# Patient Record
Sex: Male | Born: 1945
Health system: Southern US, Community
[De-identification: ages and names within clinical notes are randomized; demographics above are authoritative.]

## PROBLEM LIST (undated history)

## (undated) DIAGNOSIS — K8050 Calculus of bile duct without cholangitis or cholecystitis without obstruction: Secondary | ICD-10-CM

## (undated) DIAGNOSIS — E785 Hyperlipidemia, unspecified: Secondary | ICD-10-CM

## (undated) DIAGNOSIS — I10 Essential (primary) hypertension: Secondary | ICD-10-CM

## (undated) DIAGNOSIS — J189 Pneumonia, unspecified organism: Principal | ICD-10-CM

## (undated) DIAGNOSIS — N419 Inflammatory disease of prostate, unspecified: Secondary | ICD-10-CM

## (undated) DIAGNOSIS — M109 Gout, unspecified: Secondary | ICD-10-CM

## (undated) DIAGNOSIS — I2510 Atherosclerotic heart disease of native coronary artery without angina pectoris: Secondary | ICD-10-CM

## (undated) DIAGNOSIS — K219 Gastro-esophageal reflux disease without esophagitis: Secondary | ICD-10-CM

## (undated) DIAGNOSIS — I219 Acute myocardial infarction, unspecified: Secondary | ICD-10-CM

## (undated) HISTORY — DX: Gout, unspecified: M10.9

## (undated) HISTORY — DX: Calculus of bile duct without cholangitis or cholecystitis without obstruction: K80.50

## (undated) HISTORY — DX: Pneumonia, unspecified organism: J18.9

## (undated) HISTORY — DX: Inflammatory disease of prostate, unspecified: N41.9

## (undated) HISTORY — PX: NASAL SEPTUM SURGERY: SHX37

## (undated) HISTORY — DX: Atherosclerotic heart disease of native coronary artery without angina pectoris: I25.10

## (undated) HISTORY — DX: Gastro-esophageal reflux disease without esophagitis: K21.9

## (undated) HISTORY — DX: Essential (primary) hypertension: I10

## (undated) HISTORY — DX: Acute myocardial infarction, unspecified: I21.9

## (undated) HISTORY — DX: Hyperlipidemia, unspecified: E78.5

---

## 2000-01-05 ENCOUNTER — Encounter: Payer: Self-pay | Admitting: Family Medicine

## 2000-01-05 ENCOUNTER — Ambulatory Visit (HOSPITAL_COMMUNITY): Admission: RE | Admit: 2000-01-05 | Discharge: 2000-01-05 | Payer: Self-pay | Admitting: Family Medicine

## 2002-03-13 ENCOUNTER — Encounter: Payer: Self-pay | Admitting: Gastroenterology

## 2002-03-13 ENCOUNTER — Encounter: Payer: Self-pay | Admitting: Internal Medicine

## 2002-03-13 LAB — HM COLONOSCOPY

## 2002-10-29 ENCOUNTER — Inpatient Hospital Stay (HOSPITAL_COMMUNITY): Admission: AD | Admit: 2002-10-29 | Discharge: 2002-11-04 | Payer: Self-pay | Admitting: Internal Medicine

## 2002-10-30 ENCOUNTER — Encounter (INDEPENDENT_AMBULATORY_CARE_PROVIDER_SITE_OTHER): Payer: Self-pay | Admitting: *Deleted

## 2002-10-30 ENCOUNTER — Encounter: Payer: Self-pay | Admitting: Internal Medicine

## 2002-10-31 ENCOUNTER — Encounter: Payer: Self-pay | Admitting: Surgery

## 2002-10-31 ENCOUNTER — Encounter (INDEPENDENT_AMBULATORY_CARE_PROVIDER_SITE_OTHER): Payer: Self-pay | Admitting: *Deleted

## 2002-10-31 ENCOUNTER — Encounter (INDEPENDENT_AMBULATORY_CARE_PROVIDER_SITE_OTHER): Payer: Self-pay | Admitting: Specialist

## 2002-11-03 ENCOUNTER — Encounter (INDEPENDENT_AMBULATORY_CARE_PROVIDER_SITE_OTHER): Payer: Self-pay | Admitting: *Deleted

## 2002-11-03 ENCOUNTER — Encounter: Payer: Self-pay | Admitting: Internal Medicine

## 2002-12-01 ENCOUNTER — Encounter: Payer: Self-pay | Admitting: Internal Medicine

## 2002-12-04 ENCOUNTER — Encounter: Payer: Self-pay | Admitting: Internal Medicine

## 2002-12-04 ENCOUNTER — Ambulatory Visit (HOSPITAL_COMMUNITY): Admission: RE | Admit: 2002-12-04 | Discharge: 2002-12-04 | Payer: Self-pay | Admitting: Internal Medicine

## 2002-12-04 ENCOUNTER — Encounter (INDEPENDENT_AMBULATORY_CARE_PROVIDER_SITE_OTHER): Payer: Self-pay | Admitting: *Deleted

## 2004-05-10 ENCOUNTER — Ambulatory Visit: Payer: Self-pay | Admitting: Internal Medicine

## 2004-05-16 ENCOUNTER — Ambulatory Visit: Payer: Self-pay | Admitting: Internal Medicine

## 2004-05-28 HISTORY — PX: CHOLECYSTECTOMY: SHX55

## 2004-07-24 ENCOUNTER — Ambulatory Visit: Payer: Self-pay | Admitting: Internal Medicine

## 2004-08-01 ENCOUNTER — Ambulatory Visit: Payer: Self-pay | Admitting: Internal Medicine

## 2004-08-28 ENCOUNTER — Ambulatory Visit: Payer: Self-pay | Admitting: Internal Medicine

## 2004-09-21 ENCOUNTER — Encounter (INDEPENDENT_AMBULATORY_CARE_PROVIDER_SITE_OTHER): Payer: Self-pay | Admitting: *Deleted

## 2004-09-21 ENCOUNTER — Ambulatory Visit (HOSPITAL_COMMUNITY): Admission: RE | Admit: 2004-09-21 | Discharge: 2004-09-21 | Payer: Self-pay | Admitting: Internal Medicine

## 2004-09-21 ENCOUNTER — Ambulatory Visit: Payer: Self-pay | Admitting: Internal Medicine

## 2004-10-04 ENCOUNTER — Ambulatory Visit: Payer: Self-pay | Admitting: Internal Medicine

## 2004-10-28 ENCOUNTER — Emergency Department (HOSPITAL_COMMUNITY): Admission: EM | Admit: 2004-10-28 | Discharge: 2004-10-29 | Payer: Self-pay | Admitting: Emergency Medicine

## 2004-10-29 ENCOUNTER — Encounter: Payer: Self-pay | Admitting: Internal Medicine

## 2004-10-30 ENCOUNTER — Ambulatory Visit: Payer: Self-pay | Admitting: Internal Medicine

## 2004-11-01 ENCOUNTER — Ambulatory Visit (HOSPITAL_COMMUNITY): Admission: RE | Admit: 2004-11-01 | Discharge: 2004-11-01 | Payer: Self-pay | Admitting: Internal Medicine

## 2004-11-01 ENCOUNTER — Ambulatory Visit: Payer: Self-pay | Admitting: Internal Medicine

## 2004-11-14 ENCOUNTER — Ambulatory Visit: Payer: Self-pay | Admitting: Internal Medicine

## 2005-02-01 ENCOUNTER — Ambulatory Visit: Payer: Self-pay | Admitting: Internal Medicine

## 2005-09-18 ENCOUNTER — Ambulatory Visit: Payer: Self-pay | Admitting: Internal Medicine

## 2005-09-24 ENCOUNTER — Ambulatory Visit: Payer: Self-pay | Admitting: Internal Medicine

## 2005-09-25 ENCOUNTER — Ambulatory Visit: Payer: Self-pay | Admitting: Internal Medicine

## 2005-10-16 ENCOUNTER — Ambulatory Visit: Payer: Self-pay | Admitting: Internal Medicine

## 2005-10-19 ENCOUNTER — Ambulatory Visit: Payer: Self-pay | Admitting: Internal Medicine

## 2006-05-28 DIAGNOSIS — I2510 Atherosclerotic heart disease of native coronary artery without angina pectoris: Secondary | ICD-10-CM

## 2006-05-28 HISTORY — PX: CORONARY ANGIOPLASTY WITH STENT PLACEMENT: SHX49

## 2006-05-28 HISTORY — DX: Atherosclerotic heart disease of native coronary artery without angina pectoris: I25.10

## 2006-09-30 ENCOUNTER — Ambulatory Visit: Payer: Self-pay | Admitting: Internal Medicine

## 2006-09-30 LAB — CONVERTED CEMR LAB
ALT: 21 units/L (ref 0–40)
AST: 19 units/L (ref 0–37)
Albumin: 4 g/dL (ref 3.5–5.2)
Alkaline Phosphatase: 87 units/L (ref 39–117)
BUN: 24 mg/dL — ABNORMAL HIGH (ref 6–23)
Basophils Absolute: 0 10*3/uL (ref 0.0–0.1)
Basophils Relative: 0 % (ref 0.0–1.0)
Bilirubin, Direct: 0.1 mg/dL (ref 0.0–0.3)
CO2: 30 meq/L (ref 19–32)
Calcium: 9.4 mg/dL (ref 8.4–10.5)
Chloride: 108 meq/L (ref 96–112)
Cholesterol: 159 mg/dL (ref 0–200)
Creatinine, Ser: 1.4 mg/dL (ref 0.4–1.5)
Direct LDL: 75.1 mg/dL
Eosinophils Absolute: 0.2 10*3/uL (ref 0.0–0.6)
Eosinophils Relative: 4.4 % (ref 0.0–5.0)
GFR calc Af Amer: 66 mL/min
GFR calc non Af Amer: 55 mL/min
Glucose, Bld: 102 mg/dL — ABNORMAL HIGH (ref 70–99)
HCT: 41.3 % (ref 39.0–52.0)
HDL: 29.5 mg/dL — ABNORMAL LOW (ref >39.0)
Hemoglobin: 14.5 g/dL (ref 13.0–17.0)
Lymphocytes Relative: 26.5 % (ref 12.0–46.0)
MCHC: 35 g/dL (ref 30.0–36.0)
MCV: 91.7 fL (ref 78.0–100.0)
Monocytes Absolute: 0.5 10*3/uL (ref 0.2–0.7)
Monocytes Relative: 10.4 % (ref 3.0–11.0)
Neutro Abs: 2.8 10*3/uL (ref 1.4–7.7)
Neutrophils Relative %: 58.7 % (ref 43.0–77.0)
PSA: 0.57 ng/mL (ref 0.10–4.00)
Platelets: 197 10*3/uL (ref 150–400)
Potassium: 4.4 meq/L (ref 3.5–5.1)
RBC: 4.5 M/uL (ref 4.22–5.81)
RDW: 11.6 % (ref 11.5–14.6)
Sodium: 144 meq/L (ref 135–145)
TSH: 1.75 microintl units/mL (ref 0.35–5.50)
Total Bilirubin: 0.8 mg/dL (ref 0.3–1.2)
Total CHOL/HDL Ratio: 5.4
Total Protein: 7.1 g/dL (ref 6.0–8.3)
Triglycerides: 325 mg/dL (ref 0–149)
VLDL: 65 mg/dL — ABNORMAL HIGH (ref 0–40)
WBC: 4.8 10*3/uL (ref 4.5–10.5)

## 2006-11-11 ENCOUNTER — Ambulatory Visit: Payer: Self-pay | Admitting: Internal Medicine

## 2006-11-12 DIAGNOSIS — K831 Obstruction of bile duct: Secondary | ICD-10-CM

## 2006-11-12 DIAGNOSIS — E785 Hyperlipidemia, unspecified: Secondary | ICD-10-CM | POA: Insufficient documentation

## 2006-11-12 DIAGNOSIS — I10 Essential (primary) hypertension: Secondary | ICD-10-CM | POA: Insufficient documentation

## 2006-12-27 DIAGNOSIS — I219 Acute myocardial infarction, unspecified: Secondary | ICD-10-CM

## 2006-12-27 HISTORY — DX: Acute myocardial infarction, unspecified: I21.9

## 2007-01-07 ENCOUNTER — Inpatient Hospital Stay (HOSPITAL_COMMUNITY): Admission: EM | Admit: 2007-01-07 | Discharge: 2007-01-10 | Payer: Self-pay | Admitting: Emergency Medicine

## 2007-01-07 ENCOUNTER — Ambulatory Visit: Payer: Self-pay | Admitting: Cardiovascular Disease

## 2007-01-15 ENCOUNTER — Ambulatory Visit: Payer: Self-pay | Admitting: Cardiology

## 2007-01-20 ENCOUNTER — Ambulatory Visit: Payer: Self-pay | Admitting: Cardiology

## 2007-01-23 ENCOUNTER — Ambulatory Visit: Payer: Self-pay | Admitting: Cardiology

## 2007-01-23 ENCOUNTER — Inpatient Hospital Stay (HOSPITAL_COMMUNITY): Admission: RE | Admit: 2007-01-23 | Discharge: 2007-01-24 | Payer: Self-pay | Admitting: Cardiology

## 2007-01-30 ENCOUNTER — Ambulatory Visit: Payer: Self-pay | Admitting: Cardiology

## 2007-02-06 ENCOUNTER — Ambulatory Visit: Payer: Self-pay

## 2007-05-06 ENCOUNTER — Ambulatory Visit: Payer: Self-pay | Admitting: Cardiology

## 2007-08-08 ENCOUNTER — Telehealth: Payer: Self-pay | Admitting: Internal Medicine

## 2007-08-29 ENCOUNTER — Ambulatory Visit: Payer: Self-pay | Admitting: Cardiology

## 2007-09-20 DIAGNOSIS — K8050 Calculus of bile duct without cholangitis or cholecystitis without obstruction: Secondary | ICD-10-CM | POA: Insufficient documentation

## 2007-09-20 HISTORY — DX: Calculus of bile duct without cholangitis or cholecystitis without obstruction: K80.50

## 2007-10-06 ENCOUNTER — Ambulatory Visit: Payer: Self-pay | Admitting: Internal Medicine

## 2007-10-06 ENCOUNTER — Telehealth: Payer: Self-pay | Admitting: Internal Medicine

## 2007-10-08 LAB — CONVERTED CEMR LAB
AST: 28 units/L (ref 0–37)
Albumin: 3.5 g/dL (ref 3.5–5.2)
Alkaline Phosphatase: 85 units/L (ref 39–117)
Basophils Relative: 0 % (ref 0.0–1.0)
Bilirubin, Direct: 0.1 mg/dL (ref 0.0–0.3)
Eosinophils Absolute: 0.1 10*3/uL (ref 0.0–0.7)
Eosinophils Relative: 1.7 % (ref 0.0–5.0)
GFR calc non Af Amer: 59 mL/min
HCT: 39.7 % (ref 39.0–52.0)
Lymphocytes Relative: 22.9 % (ref 12.0–46.0)
MCHC: 35 g/dL (ref 30.0–36.0)
Neutro Abs: 2.4 10*3/uL (ref 1.4–7.7)
Potassium: 4 meq/L (ref 3.5–5.1)
RBC: 4.26 M/uL (ref 4.22–5.81)
Total Protein: 6.1 g/dL (ref 6.0–8.3)

## 2007-10-09 ENCOUNTER — Ambulatory Visit: Payer: Self-pay | Admitting: Cardiology

## 2007-10-16 ENCOUNTER — Encounter: Payer: Self-pay | Admitting: Internal Medicine

## 2007-10-16 LAB — CONVERTED CEMR LAB
Albumin: 4.2 g/dL (ref 3.5–5.2)
Alkaline Phosphatase: 99 units/L (ref 39–117)
Bilirubin, Direct: 0.2 mg/dL (ref 0.0–0.3)
Total Bilirubin: 1.4 mg/dL — ABNORMAL HIGH (ref 0.3–1.2)

## 2007-10-29 ENCOUNTER — Ambulatory Visit: Payer: Self-pay | Admitting: Internal Medicine

## 2007-10-29 DIAGNOSIS — I2510 Atherosclerotic heart disease of native coronary artery without angina pectoris: Secondary | ICD-10-CM | POA: Insufficient documentation

## 2007-10-29 DIAGNOSIS — I25119 Atherosclerotic heart disease of native coronary artery with unspecified angina pectoris: Secondary | ICD-10-CM | POA: Insufficient documentation

## 2007-11-03 ENCOUNTER — Ambulatory Visit: Payer: Self-pay | Admitting: Internal Medicine

## 2007-11-03 LAB — CONVERTED CEMR LAB
BUN: 19 mg/dL (ref 6–23)
Basophils Absolute: 0 10*3/uL (ref 0.0–0.1)
CO2: 28 meq/L (ref 19–32)
Calcium: 9 mg/dL (ref 8.4–10.5)
Eosinophils Absolute: 0.2 10*3/uL (ref 0.0–0.7)
Eosinophils Relative: 3.8 % (ref 0.0–5.0)
GFR calc Af Amer: 72 mL/min
GFR calc non Af Amer: 59 mL/min
Glucose, Bld: 90 mg/dL (ref 70–99)
HCT: 42.4 % (ref 39.0–52.0)
LDL Cholesterol: 44 mg/dL (ref 0–99)
Lymphocytes Relative: 26.6 % (ref 12.0–46.0)
MCHC: 34.4 g/dL (ref 30.0–36.0)
Monocytes Relative: 12 % (ref 3.0–12.0)
Neutro Abs: 2.6 10*3/uL (ref 1.4–7.7)
Nitrite: NEGATIVE
PSA: 0.5 ng/mL (ref 0.10–4.00)
Potassium: 3.9 meq/L (ref 3.5–5.1)
RBC: 4.46 M/uL (ref 4.22–5.81)
Sodium: 141 meq/L (ref 135–145)
Specific Gravity, Urine: 1.025
Total CHOL/HDL Ratio: 3.5
Triglycerides: 154 mg/dL — ABNORMAL HIGH (ref 0–149)
Urobilinogen, UA: 0.2
WBC: 4.6 10*3/uL (ref 4.5–10.5)

## 2007-11-26 ENCOUNTER — Ambulatory Visit: Payer: Self-pay | Admitting: Internal Medicine

## 2007-12-24 ENCOUNTER — Ambulatory Visit: Payer: Self-pay | Admitting: Cardiology

## 2007-12-24 ENCOUNTER — Encounter: Payer: Self-pay | Admitting: Internal Medicine

## 2008-03-15 LAB — CONVERTED CEMR LAB
Cholesterol: 93 mg/dL
HDL: 35 mg/dL
Triglycerides: 150 mg/dL

## 2008-03-31 ENCOUNTER — Ambulatory Visit: Payer: Self-pay | Admitting: Cardiology

## 2008-04-06 ENCOUNTER — Ambulatory Visit: Payer: Self-pay | Admitting: Internal Medicine

## 2008-06-16 ENCOUNTER — Ambulatory Visit: Payer: Self-pay | Admitting: Internal Medicine

## 2008-06-30 ENCOUNTER — Ambulatory Visit: Payer: Self-pay | Admitting: Cardiology

## 2008-07-12 ENCOUNTER — Ambulatory Visit: Payer: Self-pay | Admitting: Internal Medicine

## 2008-07-16 ENCOUNTER — Ambulatory Visit: Payer: Self-pay | Admitting: Internal Medicine

## 2008-09-16 ENCOUNTER — Ambulatory Visit: Payer: Self-pay | Admitting: Internal Medicine

## 2008-09-16 LAB — CONVERTED CEMR LAB
BUN: 22 mg/dL (ref 6–23)
Bilirubin, Direct: 0.2 mg/dL (ref 0.0–0.3)
Calcium: 9.3 mg/dL (ref 8.4–10.5)
Cholesterol: 100 mg/dL (ref 0–200)
Creatinine, Ser: 1.2 mg/dL (ref 0.4–1.5)
GFR calc non Af Amer: 64.93 mL/min (ref >60)
Glucose, Bld: 88 mg/dL (ref 70–99)
HDL: 26.1 mg/dL — ABNORMAL LOW (ref >39.00)
LDL Cholesterol: 36 mg/dL (ref 0–99)
Potassium: 4.2 meq/L (ref 3.5–5.1)
Sodium: 142 meq/L (ref 135–145)
Total Bilirubin: 2 mg/dL — ABNORMAL HIGH (ref 0.3–1.2)
Total CHOL/HDL Ratio: 4
Total Protein: 6.7 g/dL (ref 6.0–8.3)

## 2008-09-24 ENCOUNTER — Ambulatory Visit: Payer: Self-pay | Admitting: Internal Medicine

## 2008-10-11 ENCOUNTER — Ambulatory Visit: Payer: Self-pay | Admitting: Cardiology

## 2009-01-11 ENCOUNTER — Ambulatory Visit: Payer: Self-pay | Admitting: Cardiology

## 2009-01-11 ENCOUNTER — Encounter: Payer: Self-pay | Admitting: Internal Medicine

## 2009-01-26 LAB — CONVERTED CEMR LAB
ALT: 24 units/L (ref 0–53)
Alkaline Phosphatase: 115 units/L (ref 39–117)
Cholesterol: 107 mg/dL (ref 0–200)
Total Bilirubin: 1.2 mg/dL (ref 0.3–1.2)
Total CHOL/HDL Ratio: 4
VLDL: 50 mg/dL — ABNORMAL HIGH (ref 0.0–40.0)

## 2009-02-03 ENCOUNTER — Telehealth: Payer: Self-pay | Admitting: Cardiology

## 2009-02-10 ENCOUNTER — Encounter: Payer: Self-pay | Admitting: Cardiology

## 2009-02-25 ENCOUNTER — Telehealth (INDEPENDENT_AMBULATORY_CARE_PROVIDER_SITE_OTHER): Payer: Self-pay | Admitting: *Deleted

## 2009-02-28 ENCOUNTER — Encounter: Payer: Self-pay | Admitting: Internal Medicine

## 2009-02-28 ENCOUNTER — Encounter: Payer: Self-pay | Admitting: Cardiology

## 2009-03-02 ENCOUNTER — Telehealth: Payer: Self-pay | Admitting: Cardiology

## 2009-03-03 ENCOUNTER — Ambulatory Visit: Payer: Self-pay | Admitting: Internal Medicine

## 2009-03-10 ENCOUNTER — Ambulatory Visit: Payer: Self-pay | Admitting: Internal Medicine

## 2009-03-10 LAB — CONVERTED CEMR LAB
AST: 20 units/L (ref 0–37)
Albumin: 4.4 g/dL (ref 3.5–5.2)
Alkaline Phosphatase: 90 units/L (ref 39–117)
Cholesterol: 131 mg/dL (ref 0–200)
Creatinine, Ser: 1.5 mg/dL (ref 0.4–1.5)
Direct LDL: 60.1 mg/dL
Glucose, Bld: 95 mg/dL (ref 70–99)
HDL: 29.6 mg/dL — ABNORMAL LOW (ref >39.00)
Potassium: 4.5 meq/L (ref 3.5–5.1)
Sodium: 139 meq/L (ref 135–145)
Total CHOL/HDL Ratio: 4

## 2009-03-25 ENCOUNTER — Ambulatory Visit: Payer: Self-pay | Admitting: Cardiology

## 2009-03-31 ENCOUNTER — Ambulatory Visit: Payer: Self-pay | Admitting: Internal Medicine

## 2009-04-29 ENCOUNTER — Encounter (INDEPENDENT_AMBULATORY_CARE_PROVIDER_SITE_OTHER): Payer: Self-pay | Admitting: *Deleted

## 2009-06-29 ENCOUNTER — Encounter: Payer: Self-pay | Admitting: Cardiology

## 2009-06-30 ENCOUNTER — Ambulatory Visit: Payer: Self-pay | Admitting: Cardiology

## 2009-08-23 ENCOUNTER — Encounter (INDEPENDENT_AMBULATORY_CARE_PROVIDER_SITE_OTHER): Payer: Self-pay | Admitting: *Deleted

## 2009-10-05 ENCOUNTER — Ambulatory Visit: Payer: Self-pay | Admitting: Internal Medicine

## 2009-10-05 LAB — CONVERTED CEMR LAB
ALT: 18 units/L (ref 0–53)
AST: 21 units/L (ref 0–37)
Albumin: 4.2 g/dL (ref 3.5–5.2)
Alkaline Phosphatase: 93 units/L (ref 39–117)
Bilirubin, Direct: 0.1 mg/dL (ref 0.0–0.3)
Cholesterol: 109 mg/dL (ref 0–200)
Direct LDL: 49.2 mg/dL
HDL: 30.2 mg/dL — ABNORMAL LOW (ref >39.00)
Potassium: 4 meq/L (ref 3.5–5.1)
Total Bilirubin: 1 mg/dL (ref 0.3–1.2)
Total CHOL/HDL Ratio: 4
Triglycerides: 204 mg/dL — ABNORMAL HIGH (ref 0.0–149.0)

## 2009-10-14 ENCOUNTER — Ambulatory Visit: Payer: Self-pay | Admitting: Internal Medicine

## 2009-11-15 ENCOUNTER — Telehealth (INDEPENDENT_AMBULATORY_CARE_PROVIDER_SITE_OTHER): Payer: Self-pay | Admitting: *Deleted

## 2009-11-17 ENCOUNTER — Telehealth: Payer: Self-pay | Admitting: Internal Medicine

## 2009-11-18 ENCOUNTER — Encounter: Payer: Self-pay | Admitting: Internal Medicine

## 2009-12-13 ENCOUNTER — Ambulatory Visit: Payer: Self-pay | Admitting: Internal Medicine

## 2010-04-14 ENCOUNTER — Ambulatory Visit: Payer: Self-pay | Admitting: Internal Medicine

## 2010-04-14 LAB — CONVERTED CEMR LAB
ALT: 24 units/L (ref 0–53)
Albumin: 4.4 g/dL (ref 3.5–5.2)
Basophils Absolute: 0 10*3/uL (ref 0.0–0.1)
Basophils Relative: 0.4 % (ref 0.0–3.0)
Bilirubin, Direct: 0.1 mg/dL (ref 0.0–0.3)
Chloride: 100 meq/L (ref 96–112)
Cholesterol: 148 mg/dL (ref 0–200)
Creatinine, Ser: 1.6 mg/dL — ABNORMAL HIGH (ref 0.4–1.5)
Direct LDL: 66.7 mg/dL
Eosinophils Absolute: 0.2 10*3/uL (ref 0.0–0.7)
GFR calc non Af Amer: 45.05 mL/min (ref >60)
HCT: 43.4 % (ref 39.0–52.0)
Hemoglobin: 15.1 g/dL (ref 13.0–17.0)
Lymphs Abs: 1.4 10*3/uL (ref 0.7–4.0)
MCHC: 34.8 g/dL (ref 30.0–36.0)
Monocytes Relative: 10.6 % (ref 3.0–12.0)
Neutro Abs: 4 10*3/uL (ref 1.4–7.7)
PSA: 0.73 ng/mL (ref 0.10–4.00)
Platelets: 205 10*3/uL (ref 150.0–400.0)
RBC: 4.51 M/uL (ref 4.22–5.81)
RDW: 12.6 % (ref 11.5–14.6)
Total Bilirubin: 1.1 mg/dL (ref 0.3–1.2)
Total CHOL/HDL Ratio: 5

## 2010-04-24 ENCOUNTER — Ambulatory Visit: Payer: Self-pay | Admitting: Internal Medicine

## 2010-04-28 ENCOUNTER — Ambulatory Visit: Payer: Self-pay | Admitting: Cardiology

## 2010-06-18 ENCOUNTER — Encounter: Payer: Self-pay | Admitting: Internal Medicine

## 2010-06-27 NOTE — Letter (Signed)
Summary: GI Phone Note/[facility]  GI Phone Note/[facility]   Imported By: Sherian Rein 12/14/2009 09:03:25  _____________________________________________________________________  External Attachment:    Type:   Image     Comment:   External Document

## 2010-06-27 NOTE — Progress Notes (Signed)
[facility]   Imported By: Sherian Rein 12/14/2009 09:14:30  _____________________________________________________________________  External Attachment:    Type:   Image     Comment:   External Document

## 2010-06-27 NOTE — Progress Notes (Signed)
[facility]   Imported By: Sherian Rein 12/14/2009 09:05:15  _____________________________________________________________________  External Attachment:    Type:   Image     Comment:   External Document

## 2010-06-27 NOTE — Progress Notes (Signed)
[facility]   Imported By: Sherian Rein 12/14/2009 09:10:34  _____________________________________________________________________  External Attachment:    Type:   Image     Comment:   External Document

## 2010-06-27 NOTE — Progress Notes (Signed)
[facility]   Imported By: Sherian Rein 12/14/2009 09:13:21  _____________________________________________________________________  External Attachment:    Type:   Image     Comment:   External Document

## 2010-06-27 NOTE — Progress Notes (Signed)
[facility]   Imported By: Sherian Rein 12/14/2009 09:06:56  _____________________________________________________________________  External Attachment:    Type:   Image     Comment:   External Document

## 2010-06-27 NOTE — Medication Information (Signed)
[facility]   Imported By: Lester [place] 11/23/2009 10:59:24  _____________________________________________________________________  External Attachment:    Type:   Image     Comment:   External Document

## 2010-06-27 NOTE — Procedures (Signed)
Summary: ERCP/[place]  ERCP/[place]   Imported By: Sherian Rein 12/14/2009 09:09:16  _____________________________________________________________________  External Attachment:    Type:   Image     Comment:   External Document

## 2010-06-27 NOTE — Assessment & Plan Note (Signed)
Summary: fup labs//ccm   Vital Signs:  Patient profile:   65 year old male Weight:      196 pounds Temp:     98.6 degrees F oral Pulse rate:   60 / minute Pulse rhythm:   regular BP sitting:   132 / 70  (left arm) Cuff size:   large  Vitals Entered By: Alfred Levins, CMA (April 24, 2010 8:01 AM) CC: f/u on labs   Primary Care Provider:  Leeanne Deed  CC:  f/u on labs.  History of Present Illness:  Follow-Up Visit      This is a 65 year old man who presents for Follow-up visit.  The patient denies chest pain and palpitations.  Since the last visit the patient notes no new problems or concerns.  The patient reports taking meds as prescribed.  When questioned about possible medication side effects, the patient notes none.  Hx of recurrent common bile duct stones---no sxs in past 1-2 years.   All other systems reviewed and were negative   Current Problems (verified): 1)  Screening Colorectal-cancer  (ICD-V76.51) 2)  Physical Examination  (ICD-V70.0) 3)  Coronary Artery Disease  (ICD-414.00) 4)  Cholecystectomy, Laparoscopic, Hx of  (ICD-V45.79) 5)  Choledocholithiasis  (ICD-574.50) 6)  Common Bile Duct Obstruction, Hx of  (ICD-V13.8) 7)  Hypertension  (ICD-401.9) 8)  Hyperlipidemia  (ICD-272.4)  Current Medications (verified): 1)  Lisinopril 40 Mg Tabs (Lisinopril) .[name].. 1 By Mouth Once Daily 2)  Plavix 75 Mg  Tabs (Clopidogrel Bisulfate) .... Take 1 Tablet By Mouth Once A Day 3)  Aspirin 81 Mg Tbec (Aspirin) .... Once Daily 4)  Metoprolol Succinate 50 Mg Xr24h-Tab (Metoprolol Succinate) .... Take 1 Tablet By Mouth Once A Day 5)  Ursodiol 300 Mg  Caps (Ursodiol) .... 2 Tablets By Mouth Two Times A Day 6)  Lipitor 20 Mg Tabs (Atorvastatin Calcium) .... Take One Tablet By Mouth Daily. 7)  Chlorthalidone 25 Mg Tabs (Chlorthalidone) .... Take One-Half Tablet By Mouth Daily  Allergies (verified): No Known Drug Allergies  Past History:  Past Medical History: Last  updated: 10/11/2008 Hyperlipidemia-dyslipidemia Hypertension recurrent common bile duct stone Coronary Artery Disease-status post drug-eluting stent placement right coronary artery. 12/2006 Recurrent choledocholithiasis requiring multiple repeat ERCPs with stone extraction and sphincterotomy extension  Past Surgical History: Last updated: 10/11/2008 Cholecystectomy--multiple ERCP's nasal septal repair  1970's gallbladder surgery PTCA/stent  Family History: Last updated: 10/11/2008 No FH of Colon Cancer: Family History of Liver Disease/Cirrhosis:  His mother died of lung cancer and his father's health   status is unknown.   Social History: Last updated: 10/11/2008  The patient is retired from [employer]. He is a   nonsmoker and rarely uses alcohol.  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   Risk Factors: Smoking Status: never (03/31/2009)  Physical Exam  General:  well-developed well-nourished male in no acute distress. HEENT exam atraumatic, normocephalic symmetric her muscles are intact. Neck is supple. No jugular venous distention. Chest clear to auscultation cardiac exam S1-S2 are regular. Abdominal exam soft, nontender. Extremities no clubbing cyanosis or edema. Neurologic exam is alert and oriented.   Impression & Recommendations:  Problem # 1:  CORONARY ARTERY DISEASE (ICD-414.00)  no sxs continue current medications  His updated medication list for this problem includes:    Lisinopril 40 Mg Tabs (Lisinopril) .[name]... 1 by mouth once daily    Plavix 75 Mg Tabs (Clopidogrel bisulfate) .[name]... Take 1 tablet by mouth  once a day    Aspirin 81 Mg Tbec (Aspirin) ..... Once daily    Metoprolol Succinate 50 Mg Xr24h-tab (Metoprolol succinate) .[name]... Take 1 tablet by mouth once a day    Chlorthalidone 25 Mg Tabs (Chlorthalidone) .[name]... Take one-half tablet by mouth daily  Labs Reviewed: Chol: 148 (04/14/2010)   HDL: 32.00 (04/14/2010)   LDL: 36  (09/16/2008)   TG: 306.0 (04/14/2010)  Problem # 2:  CHOLEDOCHOLITHIASIS (ICD-574.50) no recurrent sxs consistent with common bile duct stone  Problem # 3:  HYPERLIPIDEMIA (ICD-272.4) controlled continue current medications  note TGs--will add fishoil.  elevated TGs could be related to recent weight gain His updated medication list for this problem includes:    Lipitor 20 Mg Tabs (Atorvastatin calcium) .[name]... Take one tablet by mouth daily.  Labs Reviewed: SGOT: 19 (04/14/2010)   SGPT: 24 (04/14/2010)   HDL:32.00 (04/14/2010), 30.20 (10/05/2009)  LDL:36 (09/16/2008), 28 (03/15/2008)  Chol:148 (04/14/2010), 109 (10/05/2009)  Trig:306.0 (04/14/2010), 204.0 (10/05/2009)  Problem # 4:  HYPERTENSION (ICD-401.9) controlled continue current medications  His updated medication list for this problem includes:    Lisinopril 40 Mg Tabs (Lisinopril) .[name]... 1 by mouth once daily    Metoprolol Succinate 50 Mg Xr24h-tab (Metoprolol succinate) .[name]... Take 1 tablet by mouth once a day    Chlorthalidone 25 Mg Tabs (Chlorthalidone) .[name]... Take one-half tablet by mouth daily  BP today: 132/70 Prior BP: 110/72 (12/13/2009)  Labs Reviewed: K+: 4.3 (04/14/2010) Creat: : 1.6 (04/14/2010)   Chol: 148 (04/14/2010)   HDL: 32.00 (04/14/2010)   LDL: 36 (09/16/2008)   TG: 306.0 (04/14/2010)  Complete Medication List: 1)  Lisinopril 40 Mg Tabs (Lisinopril) .[name].. 1 by mouth once daily 2)  Plavix 75 Mg Tabs (Clopidogrel bisulfate) .... Take 1 tablet by mouth once a day 3)  Aspirin 81 Mg Tbec (Aspirin) .... Once daily 4)  Metoprolol Succinate 50 Mg Xr24h-tab (Metoprolol succinate) .... Take 1 tablet by mouth once a day 5)  Ursodiol 300 Mg Caps (Ursodiol) .... 2 tablets by mouth two times a day 6)  Lipitor 20 Mg Tabs (Atorvastatin calcium) .... Take one tablet by mouth daily. 7)  Chlorthalidone 25 Mg Tabs (Chlorthalidone) .... Take one-half tablet by mouth daily 8)  Ra Fish Oil 1000 Mg Caps (Omega-3 fatty acids)  .... 3 by mouth once daily  Patient Instructions: 1)  Please schedule a follow-up appointment in 6 months. 2)  lipids 272.4 3)  liver 995.2 4)  bmet-995.2   Orders Added: 1)  Est. Patient Level IV [30865]  Appended Document: fup labs//ccm

## 2010-06-27 NOTE — Assessment & Plan Note (Signed)
Summary: needs Ursodiol re-newed    History of Present Illness Visit Type: Follow-up Visit Primary GI MD: Christian Flemings MD Primary Provider: Leeanne Kim Requesting Provider: n/a Chief Complaint: yearly follow up and medication refills. Pt needs a refill on Ursodiol. Pt denies any GI sx. History of Present Illness:   65 year old with hypertension, dyslipidemia, coronary artery disease with prior coronary artery stent placement for which he is on chronic Plavix and aspirin therapy, and recurrent choledocholithiasis requiring multiple ERCPs with stone extraction. For the latter he is on chronic ursodiol 600 mg b.i.d. He presents today for routine office followup. He was last seen in June of 2009. He continues to have intermittent problems with short lived abdominal discomfort. No severe episodes of pain, however. Review of outside laboratories from May 2011 showed normal liver function tests. His last routine screening colonoscopy was in 2003. This was normal. He denies any lower GI complaints. No family history of colon cancer. His chronic medical problems have been stable.   GI Review of Systems    Reports abdominal pain.     Location of  Abdominal pain: intermittent epigastric.    Denies acid reflux, belching, bloating, chest pain, dysphagia with liquids, dysphagia with solids, heartburn, loss of appetite, nausea, vomiting, vomiting blood, weight loss, and  weight gain.        Denies anal fissure, black tarry stools, change in bowel habit, constipation, diarrhea, diverticulosis, fecal incontinence, heme positive stool, hemorrhoids, irritable bowel syndrome, jaundice, light color stool, liver problems, rectal bleeding, and  rectal pain.    Current Medications (verified): 1)  Lisinopril 40 Mg Tabs (Lisinopril) .[name].. 1 By Mouth Once Daily 2)  Plavix 75 Mg  Tabs (Clopidogrel Bisulfate) .... Take 1 Tablet By Mouth Once A Day 3)  Aspirin 81 Mg Tbec (Aspirin) .... Once Daily 4)  Metoprolol  Succinate 50 Mg Xr24h-Tab (Metoprolol Succinate) .... Take 1 Tablet By Mouth Once A Day 5)  Ursodiol 300 Mg  Caps (Ursodiol) .... 2 Tablets By Mouth Two Times A Day 6)  Lipitor 20 Mg Tabs (Atorvastatin Calcium) .... Take One Tablet By Mouth Daily. 7)  Chlorthalidone 25 Mg Tabs (Chlorthalidone) .... Take One-Half Tablet By Mouth Daily  Allergies (verified): No Known Drug Allergies  Past History:  Past Medical History: Reviewed history from 10/11/2008 and no changes required. Hyperlipidemia-dyslipidemia Hypertension recurrent common bile duct stone Coronary Artery Disease-status post drug-eluting stent placement right coronary artery. 12/2006 Recurrent choledocholithiasis requiring multiple repeat ERCPs with stone extraction and sphincterotomy extension  Past Surgical History: Reviewed history from 10/11/2008 and no changes required. Cholecystectomy--multiple ERCP's nasal septal repair  1970's gallbladder surgery PTCA/stent  Family History: Reviewed history from 10/11/2008 and no changes required. No FH of Colon Cancer: Family History of Liver Disease/Cirrhosis:  His mother died of lung cancer and his father's health   status is unknown.   Social History: Reviewed history from 10/11/2008 and no changes required.  The patient is retired from [employer]. He is a   nonsmoker and rarely uses alcohol.  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   Review of Systems  The patient denies allergy/sinus, anemia, anxiety-new, arthritis/joint pain, back pain, blood in urine, breast changes/lumps, change in vision, confusion, cough, coughing up blood, depression-new, fainting, fatigue, fever, headaches-new, hearing problems, heart murmur, heart rhythm changes, itching, menstrual pain, muscle pains/cramps, night sweats, nosebleeds, pregnancy symptoms, shortness of breath, skin rash, sleeping problems, sore throat, swelling of feet/legs, swollen lymph glands, thirst -  excessive , urination - excessive , urination changes/pain, urine leakage, vision changes, and voice change.    Vital Signs:  Patient profile:   65 year old male Height:      71 inches Weight:      188.50 pounds BMI:     26.39 Pulse rate:   60 / minute Pulse rhythm:   regular BP sitting:   110 / 72  (left arm) Cuff size:   regular  Vitals Entered By: Christian Kim CMA Christian Kim) (December 13, 2009 8:25 AM)  Physical Exam  General:  Well developed, well nourished, no acute distress. Head:  Normocephalic and atraumatic. Eyes:  PERRLA, no icterus. Mouth:  No deformity or lesions, dentition normal. Neck:  Supple; no masses or thyromegaly. Lungs:  Clear throughout to auscultation. Heart:  Regular rate and rhythm; no murmurs, rubs,  or bruits. Abdomen:  Soft, nontender and nondistended. No masses, hepatosplenomegaly or hernias noted. Normal bowel sounds. Msk:  Symmetrical with no gross deformities. Normal posture. Pulses:  Normal pulses noted. Extremities:  No clubbing, cyanosis, edema or deformities noted. Neurologic:  Alert and  oriented x4; Skin:  Intact without significant lesions or rashes. Psych:  Alert and cooperative. Normal mood and affect.   Impression & Recommendations:  Problem # 1:  CHOLEDOCHOLITHIASIS (ICD-574.50) history of recurrent choledocholithiasis requiring multiple ERCPs with common duct stone extraction. He seems to be managing well on ursodiol and has not required endoscopic intervention since 2006. Recent blood work normal. Physical exam negative.  Plan: #1. Refill ursodiol 600 mg p.o. b.i.d. #2. Routine office followup in 2 years #3. Contact the office in the interim for significant recurrent problems with abdominal pain and or jaundice and/or unexplained fevers  Problem # 2:  SCREENING COLORECTAL-CANCER (ICD-V76.51) index colonoscopy in 2003 negative. Baseline risk without active symptoms or signs at present. Plan routine repeat screening colonoscopy around  October 2013. Recall placed in computer by CMA  Patient Instructions: 1)  Refill Ursodiol x 1 year. 2)  Colon recall set for 02/2012 3)  Office visit in 2 years. 4)  The medication list was reviewed and reconciled.  All changed / newly prescribed medications were explained.  A complete medication list was provided to the patient / caregiver. Prescriptions: URSODIOL 300 MG  CAPS (URSODIOL) 2 tablets by mouth two times a day  #364 x 3   Entered by:   Milford Cage NCMA   Authorized by:   Hilarie Fredrickson MD   Signed by:   Milford Cage NCMA on 12/13/2009   Method used:   Electronically to        [facility]. [address] [phone]* (retail)       [address] [place], [place]  [id number]       Ph: [phone]       Fax: [phone]   RxID:   [id number]

## 2010-06-27 NOTE — Procedures (Signed)
Summary: Colonoscopy-Dr. Jarold Motto   Colonoscopy  Procedure date:  03/13/2002  Findings:      Location:  [facility].  Results: Normal.  Patient Name: Christian Kim, Christian Kim MRN: [id number] Procedure Procedures: Colonoscopy CPT: 40981.  Personnel: Endoscopist: Vania Rea. Jarold Motto, MD.  Referred By: Valetta Mole Swords, MD.  Exam Location: Exam performed in Outpatient Clinic. Outpatient  Patient Consent: Procedure, Alternatives, Risks and Benefits discussed, consent obtained, from patient. Consent was obtained by the RN.  Indications  Average Risk Screening Routine.  History  Pre-Exam Physical: Performed Mar 13, 2002. Cardio-pulmonary exam, Rectal exam, Abdominal exam, Extremity exam, Mental status exam WNL.  Exam Exam: Extent of exam reached: Cecum, extent intended: Cecum.  The cecum was identified by appendiceal orifice and IC valve. Patient position: on left side. Duration of exam: 15 minutes. Colon retroflexion performed. Images taken. ASA Classification: I. Tolerance: excellent.  Monitoring: Pulse and BP monitoring, Oximetry used. Supplemental O2 given. at 2 Liters.  Colon Prep Used Golytely for colon prep. Prep results: excellent.  Sedation Meds: Patient assessed and found to be appropriate for moderate (conscious) sedation. Fentanyl 100 mcg. given IV. Versed 5 mg. given IV.  Instrument(s): CF 140L. Serial [id number].  Findings - NORMAL EXAM: Cecum to Rectum. Not Seen: Polyps. AVM's. Colitis. Tumors. Melanosis. Crohn's. Diverticulosis. Hemorrhoids.   Assessment Normal examination.  Events  Unplanned Interventions: No intervention was required.  Plans Patient Education: Patient given standard instructions for: Patient instructed to get routine colonoscopy every 5 years.  Disposition: After procedure patient sent to recovery. After recovery patient sent home.  Scheduling/Referral: Follow-Up prn.   This report was created from the original  endoscopy report, which was reviewed and signed by the above listed endoscopist.   cc:  Birdie Sons, MD

## 2010-06-27 NOTE — Progress Notes (Signed)
   Phone Note Call from Patient   Summary of Call: pt. requesting URSO refill .Has not been seen for 2 years. Refill given until rov visit on 12/13/09 Initial call taken by: Teryl Lucy RN,  November 15, 2009 12:10 PM

## 2010-06-27 NOTE — Progress Notes (Signed)
Summary: med complications   Phone Note Call from Patient Call back at [phone]   Caller: Patient Call For: Dr. Marina Goodell Reason for Call: Talk to Nurse Summary of Call: having trouble refilling meds Initial call taken by: Vallarie Mare,  November 17, 2009 10:59 AM    Prescriptions: URSODIOL 300 MG  CAPS (URSODIOL) 2 tablets by mouth two times a day  #120 Capsule x 0   Entered by:   Teryl Lucy RN   Authorized by:   Hilarie Fredrickson MD   Signed by:   Teryl Lucy RN on 11/17/2009   Method used:   Electronically to        [facility] [phone]* (retail)       [address] [place], [place]  [address]       Ph: [phone] or [phone]       Fax: [phone]   RxID:   [id number]   Appended Document: med complications Pt. aware rx. sent

## 2010-06-27 NOTE — Procedures (Signed)
Summary: ERCP-Dr. Russella Dar   ERCP  Procedure date:  10/30/2002  Findings:      Location: [facility]. abnormal:  [place]  ERCP  Procedure date:  10/30/2002  Findings:      Location: [facility]. abnormal:  [place] Patient Name: Christian Kim, Cardozo MRN: [id number] Procedure Procedures: Therapeutic Endoscopic Retrograde Cholangiopancreatography CPT: 43260.    with SphincterotomyCPT: 43262.    With Stone Removal CPT: S6289224.  Personnel: Endoscopist: Venita Lick. Russella Dar, MD, Clementeen Graham.  Referred By: Avel Peace, MD.  Exam Location: Exam performed in Radiology. Inpatient-ward  Patient Consent: Procedure, Alternatives, Risks and Benefits discussed, consent obtained, from patient.  Indications  Abnormal Exams, Studies: Chemistry, abnormal, do not suspect malignancy.  Symptoms: Abdominal pain.  History  Pre-Exam Physical: Performed Oct 30, 2002. Cardio-pulmonary exam, Skin color, HEENT exam  WNL. Abdominal exam abnormal. Extremity exam, Neurological exam, Mental status exam WNL.  Exam Exam: Images taken. Patient position: prone. ASA Classification: II. Tolerance: good.  Monitoring: Pulse and BP monitoring done. Oximetry used. Supplemental O2 given.  Sedation Meds: Patient assessed and found to be appropriate for moderate (conscious) sedation. Fentanyl 120 given IV. Versed 12 given IV. Glucagon 1.5 given IV. Cetacaine Spray 2 sprays given aerosolized.  Fluoroscopy: Fluoroscopy was used.  VISUALIZATION Major Papilla, Minor Papilla visualized.  Cannulation: The preferred duct was cannulated.   Ducts: Gall Bladder, Common Bile Duct, Left Hepatic Duct, Right Hepatic Duct visualized. Intrahepatic DuctsPancreatic Duct, Accessory Pancreatic Duct not sought.  Findings Normal : Right Hepatic Duct.  Normal : Left Hepatic Duct.  DUCTAL DILATION: Common Bile Duct to Common Hepatic Duct,  Maximum size: 9 mm. diffuse. ICD9: .  STONE(S) : Maximum size: 7 mm. The  stone was not impacted. Common Bile Duct,  mobile, ICD9: Choledocholithiasis: 574.50.  - Stone Removal: Common Bile Duct. Patient tolerance good. Outcome: successful. There were no complications. Comments: 12mm balloon stone extraction.  Normal : to Minor Duodenal Papilla.  Normal : Major Duodenal Papilla. Comments: ampulla was hidden behind a fold making location and sphincterotomy moderately technically difficult.  - Sphincterotomy: Major Duodenal Papilla. for stone extraction. Patient tolerance good. Outcome: successful. There were no complications.   Assessment  Biliary Tract: Abnormal examination, see findings above.  Pancreatic Tract: Exam not performed.  Ampulla: Normal examination.  Events  Unplanned Intervention: No intervention was required.  Unplanned Events: There were no complications. Plans  Post Exam Instructions: No aspirin or non-steroidal containing medications: 1 week.  Medication Plan: Continue current medications.  Patient Education: Patient given standard instructions for: Stones.  Disposition: After procedure patient sent to recovery. After recovery patient sent back to hospital.  Scheduling: Referring provider, to Avel Peace, MD, Oct 30, 2002.    This report was created from the original endoscopy report, which was reviewed and signed by the above listed endoscopist.   cc:   Avel Peace, MD

## 2010-06-27 NOTE — Progress Notes (Signed)
[facility]   Imported By: Sherian Rein 12/14/2009 09:11:43  _____________________________________________________________________  External Attachment:    Type:   Image     Comment:   External Document

## 2010-06-27 NOTE — Assessment & Plan Note (Signed)
Summary: 6 month erov/njr/pt rsc from bmp/cjr   Vital Signs:  Patient profile:   65 year old male Weight:      195 pounds Pulse rate:   60 / minute BP sitting:   160 / 88  (left arm) Cuff size:   regular  Vitals Entered By: Kern Reap CMA [name]) (Oct 14, 2009 7:57 AM) CC: follow-up visit   Primary Care Provider:  Leeanne Deed  CC:  follow-up visit.  History of Present Illness: cpx  Current Problems (verified): 1)  Physical Examination  (ICD-V70.0) 2)  Coronary Artery Disease  (ICD-414.00) 3)  Cholecystectomy, Laparoscopic, Hx of  (ICD-V45.79) 4)  Choledocholithiasis  (ICD-574.50) 5)  Common Bile Duct Obstruction, Hx of  (ICD-V13.8) 6)  Hypertension  (ICD-401.9) 7)  Hyperlipidemia  (ICD-272.4)  Current Medications (verified): 1)  Lisinopril 40 Mg Tabs (Lisinopril) .[name].. 1 By Mouth Once Daily 2)  Plavix 75 Mg  Tabs (Clopidogrel Bisulfate) .... Take 1 Tablet By Mouth Once A Day 3)  Aspirin 81 Mg Tbec (Aspirin) .... Once Daily 4)  Metoprolol Succinate 50 Mg Xr24h-Tab (Metoprolol Succinate) .... Take 1 Tablet By Mouth Once A Day--Needs Office Visit For Additional Refills 5)  Ursodiol 300 Mg  Caps (Ursodiol) .... 2 Tablets By Mouth Two Times A Day 6)  Lipitor 20 Mg Tabs (Atorvastatin Calcium) .... Take One Tablet By Mouth Daily. 7)  Chlorthalidone 25 Mg Tabs (Chlorthalidone) .... Take One-Half Tablet By Mouth Daily  Allergies (verified): No Known Drug Allergies   Impression & Recommendations:  Problem # 1:  PHYSICAL EXAMINATION (ICD-V70.0) health maint UTD advised daily exercise  Problem # 2:  HYPERTENSION (ICD-401.9) ran out of lisinopril for 3 weeks resume all meds stressed compliance His updated medication list for this problem includes:    Lisinopril 40 Mg Tabs (Lisinopril) .[name]... 1 by mouth once daily    Metoprolol Succinate 50 Mg Xr24h-tab (Metoprolol succinate) .[name]... Take 1 tablet by mouth once a day--needs office visit for additional refills  Chlorthalidone 25 Mg Tabs (Chlorthalidone) .[name]... Take one-half tablet by mouth daily  Complete Medication List: 1)  Lisinopril 40 Mg Tabs (Lisinopril) .[name].. 1 by mouth once daily 2)  Plavix 75 Mg Tabs (Clopidogrel bisulfate) .... Take 1 tablet by mouth once a day 3)  Aspirin 81 Mg Tbec (Aspirin) .... Once daily 4)  Metoprolol Succinate 50 Mg Xr24h-tab (Metoprolol succinate) .... Take 1 tablet by mouth once a day--needs office visit for additional refills 5)  Ursodiol 300 Mg Caps (Ursodiol) .... 2 tablets by mouth two times a day 6)  Lipitor 20 Mg Tabs (Atorvastatin calcium) .... Take one tablet by mouth daily. 7)  Chlorthalidone 25 Mg Tabs (Chlorthalidone) .... Take one-half tablet by mouth daily   Patient Instructions: 1)  Please schedule a follow-up appointment in 6 months. 2)  lipids 272.4 3)  liver 995.2 4)  cbc diff 5)  bmet 6)  psa 7)  tsh Prescriptions: CHLORTHALIDONE 25 MG TABS (CHLORTHALIDONE) take one-half tablet by mouth daily  #45 x 0   Entered and Authorized by:   Birdie Sons MD   Signed by:   Birdie Sons MD on 10/14/2009   Method used:   Electronically to        [facility] [phone]* (retail)       [address]       [place], [place]  [address]       Ph: [phone] or [phone]  Fax: [phone]   RxID:   [id number] LIPITOR 20 MG TABS (ATORVASTATIN CALCIUM) Take one tablet by mouth daily.  #90 x 3   Entered and Authorized by:   Birdie Sons MD   Signed by:   Birdie Sons MD on 10/14/2009   Method used:   Electronically to        [facility] [phone]* (retail)       [address] [place], [place]  [address]       Ph: [phone] or [phone]       Fax: [phone]   RxID:   [id number] METOPROLOL SUCCINATE 50 MG XR24H-TAB (METOPROLOL SUCCINATE) Take 1 tablet by mouth once a day--NEEDS OFFICE VISIT FOR ADDITIONAL REFILLS  #180 x 3   Entered and Authorized by:   Birdie Sons MD   Signed by:    Birdie Sons MD on 10/14/2009   Method used:   Electronically to        [facility] [phone]* (retail)       [address] [place], [place]  [address]       Ph: [phone] or [phone]       Fax: [phone]   RxID:   [id number] PLAVIX 75 MG  TABS (CLOPIDOGREL BISULFATE) Take 1 tablet by mouth once a day  #90 Tablet x 3   Entered and Authorized by:   Birdie Sons MD   Signed by:   Birdie Sons MD on 10/14/2009   Method used:   Electronically to        [facility] [phone]* (retail)       [address] [place], [place]  [address]       Ph: [phone] or [phone]       Fax: [phone]   RxID:   [id number] LISINOPRIL 40 MG TABS (LISINOPRIL) 1 by mouth once daily  #90 x 3   Entered and Authorized by:   Birdie Sons MD   Signed by:   Birdie Sons MD on 10/14/2009   Method used:   Electronically to        [facility] [phone]* (retail)       [address]       [place], [place]  [address]       Ph: [phone] or [phone]       Fax: [phone]   RxID:   [id number]   Physical Exam General Appearance: well developed, well nourished, no acute distress Eyes: conjunctiva and lids normal, PERRL, EOMI,  Ears, Nose, Mouth, Throat: TM clear, nares clear, oral exam WNL Neck: supple, no lymphadenopathy, no thyromegaly, no JVD Respiratory: clear to auscultation and percussion, respiratory effort normal Cardiovascular: regular rate and rhythm, S1-S2, no murmur, rub or gallop, no bruits, peripheral pulses normal and symmetric, no cyanosis, clubbing, edema or varicosities Chest: no scars, masses, tenderness; no asymmetry, skin changes, nipple discharge, no gynecomastia   Gastrointestinal: soft, non-tender; no hepatosplenomegaly, masses; active bowel sounds all quadrants, ; no masses, tenderness, hemorrhoids  Genitourinary: no hernia, or prostate enlargement Lymphatic: no cervical, axillary or  inguinal adenopathy Musculoskeletal: gait normal, muscle tone and strength WNL, no joint swelling, effusions, discoloration, crepitus  Skin: clear, good turgor, color WNL, no rashes, lesions, or ulcerations Neurologic: normal mental status, normal reflexes, normal strength, sensation, and motion Psychiatric: alert; oriented to person, place and time Other Exam:

## 2010-06-27 NOTE — Miscellaneous (Signed)
  Clinical Lists Changes  Observations: Added new observation of RS STUDY: TRACER- study completion2/2/11 (08/23/2009 11:10)      Research Study Name: TRACER- study completion2/2/11

## 2010-06-29 NOTE — Assessment & Plan Note (Signed)
Summary: f1y  Medications Added CHLORTHALIDONE 25 MG TABS (CHLORTHALIDONE) Take 1 tablet by mouth once a day      Allergies Added: NKDA  Visit Type:  1 year follow up Primary Provider:  Leeanne Deed  CC:  No complaints.  History of Present Illness: Doing well overall.  No chest pain.  Tolerating plavix.  Long discussion today regarding DAPT, and duration of treatment.    Problems Prior to Update: 1)  Screening Colorectal-cancer  (ICD-V76.51) 2)  Physical Examination  (ICD-V70.0) 3)  Coronary Artery Disease  (ICD-414.00) 4)  Choledocholithiasis  (ICD-574.50) 5)  Common Bile Duct Obstruction, Hx of  (ICD-V13.8) 6)  Hypertension  (ICD-401.9) 7)  Hyperlipidemia  (ICD-272.4)  Current Medications (verified): 1)  Lisinopril 40 Mg Tabs (Lisinopril) .[name].. 1 By Mouth Once Daily 2)  Plavix 75 Mg  Tabs (Clopidogrel Bisulfate) .... Take 1 Tablet By Mouth Once A Day 3)  Aspirin 81 Mg Tbec (Aspirin) .... Once Daily 4)  Metoprolol Succinate 50 Mg Xr24h-Tab (Metoprolol Succinate) .... Take 1 Tablet By Mouth Once A Day 5)  Ursodiol 300 Mg  Caps (Ursodiol) .... 2 Tablets By Mouth Two Times A Day 6)  Lipitor 20 Mg Tabs (Atorvastatin Calcium) .... Take One Tablet By Mouth Daily. 7)  Chlorthalidone 25 Mg Tabs (Chlorthalidone) .... Take 1 Tablet By Mouth Once A Day 8)  Ra Fish Oil 1000 Mg Caps (Omega-3 Fatty Acids) .... 3 By Mouth Once Daily  Allergies (verified): No Known Drug Allergies  Past History:  Past Medical History: Last updated: 10/11/2008 Hyperlipidemia-dyslipidemia Hypertension recurrent common bile duct stone Coronary Artery Disease-status post drug-eluting stent placement right coronary artery. 12/2006 Recurrent choledocholithiasis requiring multiple repeat ERCPs with stone extraction and sphincterotomy extension  Past Surgical History: Last updated: 10/11/2008 Cholecystectomy--multiple ERCP's nasal septal repair  1970's gallbladder surgery PTCA/stent  Family  History: Last updated: 10/11/2008 No FH of Colon Cancer: Family History of Liver Disease/Cirrhosis:  His mother died of lung cancer and his father's health   status is unknown.   Social History: Last updated: 10/11/2008  The patient is retired from [employer]. He is a   nonsmoker and rarely uses alcohol.  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   Vital Signs:  Patient profile:   65 year old male Height:      71 inches Weight:      196 pounds BMI:     27.44 Pulse rate:   63 / minute Pulse rhythm:   regular Resp:     18 per minute BP sitting:   114 / 64  (left arm) Cuff size:   large  Vitals Entered By: Vikki Ports (April 28, 2010 8:47 AM)  Physical Exam  General:  Well developed, well nourished, in no acute distress. Head:  normocephalic and atraumatic Eyes:  PERRLA/EOM intact; conjunctiva and lids normal. Neck:  No carotid bruits. Lungs:  Clear bilaterally to auscultation and percussion. Heart:  PMI non displaced.  Normal S1 and S2.  No murmur noted.  No rub.  Pulses:  pulses normal in all 4 extremities Extremities:  No clubbing or cyanosis. Neurologic:  Alert and oriented x 3.   Cardiac Cath  Procedure date:  01/23/2007  Findings:       CONCLUSION:   1. Continued patency of the circumflex infarct artery with previous       two drug-eluting stents.   2. Successful percutaneous stenting of a high-grade stenosis in the  distal portion of the midvessel of the right coronary artery   3. Residual diffuse plaque involving the more proximal portion of the       right coronary artery and mid LAD as described above.      DISPOSITION:   1. The patient will be treated with aspirin and Plavix.   2. Aggressive lipid management will be recommended with a goal of LDL       of less than 70.  Continued follow-up in the office will also be       recommended.      Cardiac Cath  Procedure date:  04/29/2007  Findings:      NSR.  WNL.  Impression & Recommendations:  Problem # 1:  CORONARY ARTERY DISEASE (ICD-414.00) stable.  No symptoms.  Tolerating well.  Long discussion regarding DAPT.  Has overlapping stents, and separate RCA stent.  We reviewed all of this.   His updated medication list for this problem includes:    Lisinopril 40 Mg Tabs (Lisinopril) .[name]... 1 by mouth once daily    Plavix 75 Mg Tabs (Clopidogrel bisulfate) .[name]... Take 1 tablet by mouth once a day    Aspirin 81 Mg Tbec (Aspirin) ..... Once daily    Metoprolol Succinate 50 Mg Xr24h-tab (Metoprolol succinate) .[name]... Take 1 tablet by mouth once a day  Orders: EKG w/ Interpretation (93000)  Problem # 2:  HYPERTENSION (ICD-401.9) controlled, with k that is ok on BMET. His updated medication list for this problem includes:    Lisinopril 40 Mg Tabs (Lisinopril) .[name]... 1 by mouth once daily    Aspirin 81 Mg Tbec (Aspirin) ..... Once daily    Metoprolol Succinate 50 Mg Xr24h-tab (Metoprolol succinate) .[name]... Take 1 tablet by mouth once a day    Chlorthalidone 25 Mg Tabs (Chlorthalidone) .[name]... Take 1 tablet by mouth once a day  Problem # 3:  HYPERLIPIDEMIA (ICD-272.4) LDL at target. HDL low.  His updated medication list for this problem includes:    Lipitor 20 Mg Tabs (Atorvastatin calcium) .[name]... Take one tablet by mouth daily.  Patient Instructions: 1)  Your physician recommends that you schedule a follow-up appointment in: 12 months. The office will mail you a reminder letter 2 months prior appointment date.

## 2010-07-13 ENCOUNTER — Other Ambulatory Visit: Payer: Self-pay | Admitting: Internal Medicine

## 2010-07-19 ENCOUNTER — Encounter: Payer: Self-pay | Admitting: Physician Assistant

## 2010-07-19 ENCOUNTER — Other Ambulatory Visit: Payer: Medicare Other

## 2010-07-19 ENCOUNTER — Telehealth: Payer: Self-pay | Admitting: Internal Medicine

## 2010-07-19 ENCOUNTER — Ambulatory Visit (INDEPENDENT_AMBULATORY_CARE_PROVIDER_SITE_OTHER): Payer: BC Managed Care – PPO | Admitting: Physician Assistant

## 2010-07-19 ENCOUNTER — Other Ambulatory Visit: Payer: Self-pay | Admitting: Internal Medicine

## 2010-07-19 ENCOUNTER — Telehealth: Payer: Self-pay | Admitting: Physician Assistant

## 2010-07-19 ENCOUNTER — Encounter (INDEPENDENT_AMBULATORY_CARE_PROVIDER_SITE_OTHER): Payer: Self-pay | Admitting: *Deleted

## 2010-07-19 DIAGNOSIS — R1013 Epigastric pain: Secondary | ICD-10-CM

## 2010-07-19 DIAGNOSIS — Z87892 Personal history of anaphylaxis: Secondary | ICD-10-CM

## 2010-07-19 DIAGNOSIS — R110 Nausea: Secondary | ICD-10-CM

## 2010-07-19 LAB — LIPASE: Lipase: 23 U/L (ref 11.0–59.0)

## 2010-07-19 LAB — CBC WITH DIFFERENTIAL/PLATELET
Basophils Relative: 0.3 % (ref 0.0–3.0)
Eosinophils Absolute: 0.1 10*3/uL (ref 0.0–0.7)
Eosinophils Relative: 1.1 % (ref 0.0–5.0)
HCT: 42.3 % (ref 39.0–52.0)
Lymphs Abs: 1.3 10*3/uL (ref 0.7–4.0)
MCHC: 35.3 g/dL (ref 30.0–36.0)
MCV: 95 fl (ref 78.0–100.0)
Monocytes Absolute: 0.7 10*3/uL (ref 0.1–1.0)
Neutrophils Relative %: 77.9 % — ABNORMAL HIGH (ref 43.0–77.0)
Platelets: 188 10*3/uL (ref 150.0–400.0)
RBC: 4.45 Mil/uL (ref 4.22–5.81)
WBC: 9.7 10*3/uL (ref 4.5–10.5)

## 2010-07-19 LAB — HEPATIC FUNCTION PANEL
ALT: 28 U/L (ref 0–53)
Albumin: 4.5 g/dL (ref 3.5–5.2)
Total Protein: 7.3 g/dL (ref 6.0–8.3)

## 2010-07-21 ENCOUNTER — Ambulatory Visit (HOSPITAL_COMMUNITY)
Admission: RE | Admit: 2010-07-21 | Discharge: 2010-07-21 | Disposition: A | Discharge status: Home / Self Care | Payer: Federal, State, Local not specified - PPO | Source: Ambulatory Visit | Attending: Internal Medicine | Admitting: Internal Medicine

## 2010-07-21 DIAGNOSIS — R1013 Epigastric pain: Secondary | ICD-10-CM | POA: Insufficient documentation

## 2010-07-21 DIAGNOSIS — I10 Essential (primary) hypertension: Secondary | ICD-10-CM | POA: Insufficient documentation

## 2010-07-21 DIAGNOSIS — Z9089 Acquired absence of other organs: Secondary | ICD-10-CM | POA: Insufficient documentation

## 2010-07-24 ENCOUNTER — Other Ambulatory Visit: Payer: Self-pay | Admitting: Internal Medicine

## 2010-07-24 DIAGNOSIS — I10 Essential (primary) hypertension: Secondary | ICD-10-CM

## 2010-07-25 ENCOUNTER — Encounter: Payer: Self-pay | Admitting: Internal Medicine

## 2010-07-25 NOTE — Progress Notes (Signed)
Summary: Scheduled  ABD [place]   Phone Note Outgoing Call   Call placed by: Joselyn Glassman,  July 19, 2010 3:56 PM Call placed to: Patient Summary of Call: Called pt to inform him of the ABD [place] Amy ordered. It will be done Fri 07-21-2010 at 9:30 AM.  He is to be NPO after midnight and he should arrive at 9:15 AM at [facility] Radiology Dept on the 1st floor. Called pt's cell # of [phone]. Initial call taken by: Joselyn Glassman,  July 19, 2010 3:57 PM

## 2010-07-25 NOTE — Assessment & Plan Note (Addendum)
Summary: Abdominal pain    History of Present Illness Primary GI MD: Yancey Flemings MD Primary Provider: Leeanne Deed Requesting Provider: n/a Chief Complaint: Increase upper abd pain radiating to upper back. Pt states he has been having the abd pain since Cholecystectomy several years ago. Pt does not relate the pain to meals or any point in the day but will last 4-5 minutes at a time but has been lasting 30 minutes at a time lately. Upper abdomen is very tender to with pressure. History of Present Illness:   PLEASANT 65 YO MALE  KNOWN TO DR. PERRY. HE HAS HX OF REMOTE CHOLECYSTECTOMY AND SEVERAL EPISODES OF RECURRENT CHOLEDOCHOLITHIASIS. HE HAS HED SEVERAL ERCP'S WITH STONE EXTRACTION. LAST ERCP WAS DONE IN 2006 WITH EXTENTION OF SPHINCTEROMY.  HE HAS BEEN MAINTAINED ON URSO 600 MG TWICE DAILY. HE SAYS HE HAS OCCASIONAL EPISODES OF EPIGASTRIC PAIN THAT TYPICALLY LASTS FOR 5 MINUTES THEN RESOLVES. RECENTLY HE  HAS HAD MORE FREQUENT EPISODES, AND HAD A COUPLE THIS PAST WEEKEND.  YESTERDAY HE HAD AN INTENSE EPISODE  ABOUT 11 AM , THEN AGAIN AT 7 PM LAST NIGHT. THESE WERE SEVERE WITH PAIN RADIATIING INTO HIS [facility] LASTING ABOUT 30 MINUTES.  HE HAD SWEATS, NO NAUSEA OR VOMITING. TODAY HE FEELS SORE ACROSS HIS UPPER ABDOMEN. NO FEVER,HAS EATEN.  HE SAYS HE HAS F"PRESSURE LIKE" FEELING PUSHING ON HIS RIB CAGE BILATERALLY.   GI Review of Systems    Reports abdominal pain.     Location of  Abdominal pain: upper abdomen.    Denies acid reflux, belching, bloating, chest pain, dysphagia with liquids, dysphagia with solids, heartburn, loss of appetite, nausea, vomiting, vomiting blood, weight loss, and  weight gain.        Denies anal fissure, black tarry stools, change in bowel habit, constipation, diarrhea, diverticulosis, fecal incontinence, heme positive stool, hemorrhoids, irritable bowel syndrome, jaundice, light color stool, liver problems, rectal bleeding, and  rectal pain.    Current  Medications (verified): 1)  Lisinopril 40 Mg Tabs (Lisinopril) .[name].. 1 By Mouth Once Daily 2)  Plavix 75 Mg  Tabs (Clopidogrel Bisulfate) .... Take 1 Tablet By Mouth Once A Day 3)  Aspirin 81 Mg Tbec (Aspirin) .... Once Daily 4)  Metoprolol Succinate 50 Mg Xr24h-Tab (Metoprolol Succinate) .... Take 1 Tablet By Mouth Once A Day 5)  Ursodiol 300 Mg  Caps (Ursodiol) .... 2 Tablets By Mouth Two Times A Day 6)  Lipitor 20 Mg Tabs (Atorvastatin Calcium) .... Take One Tablet By Mouth Daily. 7)  Chlorthalidone 25 Mg Tabs (Chlorthalidone) .... Take 1 Tablet By Mouth Once A Day 8)  Ra Fish Oil 1000 Mg Caps (Omega-3 Fatty Acids) .... 3 By Mouth Once Daily  Allergies (verified): No Known Drug Allergies  Past History:  Past Medical History: Hyperlipidemia-dyslipidemia Hypertension  Coronary Artery Disease-status post drug-eluting stent placement right coronary artery. 12/2006 Recurrent choledocholithiasis requiring multiple repeat ERCPs with stone extraction and sphincterotomy extension 2006  Past Surgical History: Cholecystectomy--multiple ERCP's nasal septal repair  1970's  PTCA/stent  Family History: Reviewed history from 10/11/2008 and no changes required. No FH of Colon Cancer: Family History of Liver Disease/Cirrhosis:  His mother died of lung cancer and his father's health   status is unknown.   Social History: Reviewed history from 10/11/2008 and no changes required.  The patient is retired from [employer]. He is a   nonsmoker and rarely uses alcohol.  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  Review of Systems  The patient denies allergy/sinus, anemia, anxiety-new, arthritis/joint pain, back pain, blood in urine, breast changes/lumps, change in vision, confusion, cough, coughing up blood, depression-new, fainting, fatigue, fever, headaches-new, hearing problems, heart murmur, heart rhythm changes, itching, menstrual pain, muscle pains/cramps, night sweats,  nosebleeds, pregnancy symptoms, shortness of breath, skin rash, sleeping problems, sore throat, swelling of feet/legs, swollen lymph glands, thirst - excessive , urination - excessive , urination changes/pain, urine leakage, vision changes, and voice change.         see hpi  Vital Signs:  Patient profile:   65 year old male Height:      71 inches Weight:      194 pounds BMI:     27.16 Temp:     98.7 degrees F oral Pulse rate:   68 / minute Pulse rhythm:   regular BP sitting:   112 / 76  (left arm) Cuff size:   regular  Vitals Entered By: Christie Nottingham CMA [name]) (July 19, 2010 2:09 PM)  Physical Exam  General:  Well developed, well nourished, no acute distress. Head:  Normocephalic and atraumatic. Eyes:  PERRLA, no icterus. Lungs:  Clear throughout to auscultation. Heart:  Regular rate and rhythm; no murmurs, rubs,  or bruits. Abdomen:  SOFT, MILD TENDERNESS EPIGASTRIUM, NO GUARDING, NO MASS OR HSM,BS+ Rectal:  NOT DONE Extremities:  No clubbing, cyanosis, edema or deformities noted. Neurologic:  Alert and  oriented x4;  grossly normal neurologically. Psych:  Alert and cooperative. Normal mood and affect.   Impression & Recommendations:  Problem # 1:  ABDOMINAL PAIN, EPIGASTRIC (ICD-789.06) Assessment Deteriorated 65 YO MALE WITH HX OF RECURRENT CHOLEDOCHOLITHIASIS-S/P SEVERAL ERCP'S WITH STONE EXTRACTION(LAST IN 2006). S/P CHOLECYSTECTOMY. PT WITH RECURRENT INTENSE PAIN YESTERDAY -R/O CBD STONE,R/O PANCREATITIS.  STAT LABS  WERE DONE WITH OFFICE VISIT-CBC,CMET ,LIPASE ALL NORMAL OFFERED ANALGESIC -PT DECLINED SCHEDULE UPPER ABDOMINAL US . CONTINUE URSO WILL DISCUSS WITH DR. PERRY-REGARDING POSSIBLE ERCP. PT IS ON PLAVIX Orders: Ultrasound Abdomen (UAS)  Problem # 2:  CORONARY ARTERY DISEASE (ICD-414.00) Assessment: Comment Only STENTS- ON PLAVIX  Problem # 3:  HYPERTENSION (ICD-401.9) Assessment: Comment Only  Problem # 4:  HYPERLIPIDEMIA  (ICD-272.4) Assessment: Comment Only

## 2010-07-25 NOTE — Progress Notes (Signed)
Summary: Triage: Pain  Phone Note Call from Patient Call back at [phone]   Caller: Patient Call For: Dr. Marina Goodell Reason for Call: Talk to Nurse Summary of Call: Stones in bowel duct and having pain where he had his gallbladder removed Initial call taken by: Karna Christmas,  July 19, 2010 8:22 AM  Follow-up for Phone Call        Patient states that he had 2 attacks last night and thinks that he passed some gallstones. He states he is very sore and that last night the pain went all the way through to his back. He states that he broke out into a sweat. It happened at 11am and 7pm, he states he had a hard time even sleeping on his side due to the discomfort. He has not run any fever. Appt made for patient to see Mike Gip, PA today at 2pm. Follow-up by: Selinda Michaels RN,  July 19, 2010 11:06 AM  Additional Follow-up for Phone Call Additional follow up Details #1::        HAVE HIM GO TO THE LAB FOR STAT CBC, LFTS, AND LIPASE PRIOR TO O.V.  Additional Follow-up by: Hilarie Fredrickson MD,  July 19, 2010 11:57 AM    Additional Follow-up for Phone Call Additional follow up Details #2::    Patient knows to go to the lab prior to appointment with Mike Gip, PA and orders placed in computer. Follow-up by: Selinda Michaels RN,  July 19, 2010 12:08 PM

## 2010-08-02 ENCOUNTER — Encounter: Payer: Self-pay | Admitting: Internal Medicine

## 2010-08-03 NOTE — Letter (Signed)
Summary: Appt Reminder 2  [facility]  [address]   [place], [place] [address]   Phone: [phone]  Fax: [phone]        July 25, 2010 MRN: [id number]    Christian Kim [address] [place], [place]  [address]    Dear [name],   You have a return appointment with Dr. Marina Goodell on 08/28/10 at 1:30pm.  Please remember to bring a complete list of the medicines you are taking, your insurance card and your co-pay.  If you have to cancel or reschedule this appointment, please call before 5:00 pm the evening before to avoid a cancellation fee.  If you have any questions or concerns, please call [phone].    Sincerely,    Selinda Michaels RN  Appended Document: Appt Reminder 2 Letter is mailed to the patient's home address

## 2010-08-25 ENCOUNTER — Other Ambulatory Visit: Payer: Self-pay | Admitting: Cardiology

## 2010-08-28 ENCOUNTER — Ambulatory Visit: Payer: BC Managed Care – PPO | Admitting: Internal Medicine

## 2010-09-14 ENCOUNTER — Ambulatory Visit (INDEPENDENT_AMBULATORY_CARE_PROVIDER_SITE_OTHER): Payer: BC Managed Care – PPO | Admitting: Internal Medicine

## 2010-09-14 ENCOUNTER — Encounter: Payer: Self-pay | Admitting: *Deleted

## 2010-09-14 ENCOUNTER — Encounter: Payer: Self-pay | Admitting: Internal Medicine

## 2010-09-14 DIAGNOSIS — K8050 Calculus of bile duct without cholangitis or cholecystitis without obstruction: Secondary | ICD-10-CM

## 2010-09-14 DIAGNOSIS — R1013 Epigastric pain: Secondary | ICD-10-CM

## 2010-09-14 NOTE — Patient Instructions (Signed)
ERCP scheduled for 09/26/10 12 noon arrive at 11:00 am at [facility] Out[facility] Registration ERCP information given to you to read.

## 2010-09-14 NOTE — Progress Notes (Signed)
HISTORY OF PRESENT ILLNESS:  Christian Kim is a 65 y.o. male with the below listed medical history who presents today regarding recurrent abdominal pain. He has been followed in this office for recurrent choledocholithiasis post cholecystectomy. Is undergone multiple ERCPs with sphincterotomy, sphincterotomy extension, and stone extraction. It appears his last such procedure was in April 2006. He has been on ursodiol chronically. Seen 07/19/2010 by Mike Gip, PA-C for increased frequency and severity of upper abdominal pain. Describes several intense episodes lasting about 30 minutes. Radiation to the back. Soreness across the upper abdomen. Some associated diaphoresis. Liver function tests were normal. Abdominal ultrasound did not show common duct dilation or other abnormality. He reports a similar severe episode yesterday. He feels that this is similar to his problems with bile duct stones. No fevers or chills. He remains on Plavix therapy for history of coronary artery disease with prior coronary artery stent placement. His chronic medical problems are stable.  REVIEW OF SYSTEMS:  All non-GI ROS are negative.  Past Medical History  Diagnosis Date  . Hyperlipidemia   . Hypertension   . CAD (coronary artery disease)   . Choledocholithiasis     recurrent/multiple ERCPs withi stone extraction and sphincterotomy     Past Surgical History  Procedure Date  . Cholecystectomy   . Nasal septum surgery   . Coronary angioplasty with stent placement     Social History AARO MEYERS  reports that he has never smoked. He does not have any smokeless tobacco history on file. He reports that he drinks alcohol. He reports that he does not use illicit drugs.  family history includes Liver cancer in his mother and Lung cancer in his mother.  There is no history of Colon cancer.  No Known Allergies     PHYSICAL EXAMINATION:  Vital signs: BP 114/58  Pulse 60  Ht 5\' 11"  (1.803 m)  Wt 194 lb  6.4 oz (88.179 kg)  BMI 27.11 kg/m2 General: Well-developed, well-nourished, no acute distress HEENT: Sclerae are anicteric, conjunctiva pink. Oral mucosa intact Lungs: Clear Heart: Regular Abdomen: soft, with mild epigastric tenderness to palpation, nondistended, no obvious ascites, no peritoneal signs, normal bowel sounds. No organomegaly. Extremities: No edema Psychiatric: alert and oriented x3. Cooperative   ASSESSMENT:  #1. Recurrent problems with upper abdominal pain reminiscent of his issues with choledocholithiasis. Last ERCP 2006. Despite normal liver tests and nondilated duct on ultrasound, suspect the same.  #2 coronary artery disease with history of coronary artery stents. On Plavix.  PLAN:  #1. ERCP with possible balloon dilation of the sphincter and possible common duct stone extraction.The nature of the procedure, as well as the risks, benefits, and alternatives were carefully and thoroughly reviewed with the patient. Ample time for discussion and questions allowed. The patient understood, was satisfied, and agreed to proceed. The patient will be given IV Cipro preprocedure. He will be maintained on Plavix therapy for the exam. We discussed the pros and cons of this approach.

## 2010-09-18 ENCOUNTER — Telehealth: Payer: Self-pay | Admitting: *Deleted

## 2010-09-18 ENCOUNTER — Telehealth: Payer: Self-pay | Admitting: Cardiology

## 2010-09-18 NOTE — Telephone Encounter (Signed)
Check BMET next week.  He should be well hydrated at time of blood draw. Drink a large glass of water before labs

## 2010-09-18 NOTE — Telephone Encounter (Signed)
Pt had a Physical and labs at work.  Was told his Creat was 1.65, and needed to be repeated by Dr. Cato Mulligan with his recommendations this week.  Please call pt, and let him know what labs and when he can be seen.

## 2010-09-18 NOTE — Telephone Encounter (Signed)
I left a message to let the pt know that at this time I have not received fax.

## 2010-09-18 NOTE — Telephone Encounter (Signed)
Scheduled labs 

## 2010-09-22 ENCOUNTER — Telehealth: Payer: Self-pay | Admitting: Cardiology

## 2010-09-22 NOTE — Telephone Encounter (Signed)
Pt signed ROI, picked up Records 09/21/10/KM

## 2010-09-25 ENCOUNTER — Encounter (INDEPENDENT_AMBULATORY_CARE_PROVIDER_SITE_OTHER): Payer: BC Managed Care – PPO | Admitting: Internal Medicine

## 2010-09-25 DIAGNOSIS — R7989 Other specified abnormal findings of blood chemistry: Secondary | ICD-10-CM

## 2010-09-25 DIAGNOSIS — R799 Abnormal finding of blood chemistry, unspecified: Secondary | ICD-10-CM

## 2010-09-25 LAB — BASIC METABOLIC PANEL
BUN: 27 mg/dL — ABNORMAL HIGH (ref 6–23)
Chloride: 102 mEq/L (ref 96–112)
GFR: 48.02 mL/min — ABNORMAL LOW (ref >60.00)
Potassium: 3.9 mEq/L (ref 3.5–5.1)
Sodium: 137 mEq/L (ref 135–145)

## 2010-09-26 ENCOUNTER — Other Ambulatory Visit: Payer: Medicare Other | Admitting: Internal Medicine

## 2010-09-26 ENCOUNTER — Ambulatory Visit (HOSPITAL_COMMUNITY): Payer: Federal, State, Local not specified - PPO

## 2010-09-26 ENCOUNTER — Ambulatory Visit (HOSPITAL_COMMUNITY)
Admission: RE | Admit: 2010-09-26 | Discharge: 2010-09-26 | Disposition: A | Discharge status: Home / Self Care | Payer: Federal, State, Local not specified - PPO | Source: Ambulatory Visit | Attending: Internal Medicine | Admitting: Internal Medicine

## 2010-09-26 DIAGNOSIS — I10 Essential (primary) hypertension: Secondary | ICD-10-CM | POA: Insufficient documentation

## 2010-09-26 DIAGNOSIS — E785 Hyperlipidemia, unspecified: Secondary | ICD-10-CM | POA: Insufficient documentation

## 2010-09-26 DIAGNOSIS — R1011 Right upper quadrant pain: Secondary | ICD-10-CM | POA: Insufficient documentation

## 2010-09-26 DIAGNOSIS — R932 Abnormal findings on diagnostic imaging of liver and biliary tract: Secondary | ICD-10-CM

## 2010-09-26 DIAGNOSIS — K2980 Duodenitis without bleeding: Secondary | ICD-10-CM | POA: Insufficient documentation

## 2010-09-26 DIAGNOSIS — Z9089 Acquired absence of other organs: Secondary | ICD-10-CM | POA: Insufficient documentation

## 2010-09-26 DIAGNOSIS — I2510 Atherosclerotic heart disease of native coronary artery without angina pectoris: Secondary | ICD-10-CM | POA: Insufficient documentation

## 2010-10-01 NOTE — Progress Notes (Signed)
This encounter was created in error - please disregard.

## 2010-10-02 NOTE — Telephone Encounter (Signed)
I spoke with the pt and he brought the form into the office and turned it into medical records. The form was a request for records.  The pt has already obtained these records from the medical records department.

## 2010-10-06 ENCOUNTER — Ambulatory Visit (INDEPENDENT_AMBULATORY_CARE_PROVIDER_SITE_OTHER): Payer: BC Managed Care – PPO | Admitting: Internal Medicine

## 2010-10-06 ENCOUNTER — Encounter: Payer: Self-pay | Admitting: Internal Medicine

## 2010-10-06 VITALS — BP 132/80 | HR 68 | Temp 98.5°F | Ht 71.0 in | Wt 195.0 lb

## 2010-10-06 DIAGNOSIS — H6120 Impacted cerumen, unspecified ear: Secondary | ICD-10-CM

## 2010-10-06 DIAGNOSIS — N1832 Chronic kidney disease, stage 3b: Secondary | ICD-10-CM | POA: Insufficient documentation

## 2010-10-06 DIAGNOSIS — N289 Disorder of kidney and ureter, unspecified: Secondary | ICD-10-CM

## 2010-10-06 NOTE — Progress Notes (Signed)
  Subjective:    Patient ID: Christian Kim, male    DOB: [birth year], 65 y.o.   MRN: [id number]  HPI  Decreased hearing---told cerumen impaction  F/u creatinine  Past Medical History  Diagnosis Date  . Hyperlipidemia   . Hypertension   . CAD (coronary artery disease)   . Choledocholithiasis     recurrent/multiple ERCPs withi stone extraction and sphincterotomy    Past Surgical History  Procedure Date  . Cholecystectomy   . Nasal septum surgery   . Coronary angioplasty with stent placement     reports that he has never smoked. He does not have any smokeless tobacco history on file. He reports that he drinks alcohol. He reports that he does not use illicit drugs. family history includes Liver cancer in his mother and Lung cancer in his mother.  There is no history of Colon cancer. No Known Allergies   Review of Systems  patient denies chest pain, shortness of breath, orthopnea. Denies lower extremity edema, abdominal pain, change in appetite, change in bowel movements. Patient denies rashes, musculoskeletal complaints. No other specific complaints in a complete review of systems.      Objective:   Physical Exam  well-developed well-nourished male in no acute distress. Left cerumen impaction     Assessment & Plan:  Cerumen impaction---irrigated.

## 2010-10-09 NOTE — Op Note (Signed)
  NAME:  Christian Kim, Christian Kim NO.:  [id number]  MEDICAL RECORD NO.:  [id number]           PATIENT TYPE:  O  LOCATION:  [facility]                         FACILITY:  [facility]  PHYSICIAN:  Wilhemina Bonito. Marina Goodell, MD      DATE OF BIRTH:  January 09, 1946  DATE OF PROCEDURE:  09/26/2010 DATE OF DISCHARGE:                              OPERATIVE REPORT   PROCEDURE:  Endoscopic retrograde cholangiography.  INDICATIONS:  History of choledocholithiasis, now with recurrent right upper quadrant pain, normal labs and normal imaging.  PHYSICAL EXAMINATION:  GENERAL:  Well-appearing male, in no acute distress.  He is alert and oriented. VITAL SIGNS:  Stable. LUNGS:  Clear. HEART:  Regular. ABDOMEN:  Benign.  DESCRIPTION OF PROCEDURE:  After informed consent was obtained, the patient was sedated with 100 mcg for fentanyl and 10 mg of Versed IV. Preoperative antibiotics were administered.  The Pentax side-viewing endoscope was passed blindly into the esophagus.  The stomach was unremarkable.  The duodenum was somewhat edematous without obvious ulcer.  The major ampulla was identified.  Prior sphincterotomy evident. Additional scout radiograph demonstrate the endoscope in position and prior cholecystectomy clips.  No attempt at pancreatogram was made. Injection of contrast in the biliary tree revealed a normal bile duct post cholecystectomy.  No obvious evidence of stones.  A sequential 12- 15 mm balloon was pulled through the biliary tree several times without any debris extracted.  Good drainage.  IMPRESSION: 1. Normal biliary tree postcholecystectomy.  No evidence of recurrent     choledocholithiasis. 2. Duodenitis.  RECOMMENDATIONS: 1. Initiate Prilosec OTC 20 mg daily. 2. Routine office followup in 4-6 weeks.     Wilhemina Bonito. Marina Goodell, MD     JNP/MEDQ  D:  09/26/2010  T:  09/26/2010  Job:  [id number]  Electronically Signed by Yancey Flemings MD on 10/09/2010 07:13:03 PM

## 2010-10-10 NOTE — Discharge Summary (Signed)
NAME:  [name], Christian Kim NO.:  [id number]   MEDICAL RECORD NO.:  [id number]          PATIENT TYPE:  OIB   LOCATION:  6524                         FACILITY:  [facility]   PHYSICIAN:  Arturo Morton. Riley Kill, MD, FACCDATE OF BIRTH:  1945/11/25   DATE OF ADMISSION:  01/23/2007  DATE OF DISCHARGE:  01/24/2007                         DISCHARGE SUMMARY - REFERRING   DISCHARGE DIAGNOSIS:  Coronary artery disease, status post drug-eluting  stent to the right coronary artery in a staged procedure.   PROCEDURE PERFORMED:  Cardiac catheterization, with drug-eluting stent  to RCA by Dr. Riley Kill on January 23, 2007.   SUMMARY OF HISTORY:  [name] is a 65 year old male who is well known  to Dr. Riley Kill.  He initially presented on January 07, 2007 with acute MI  secondary to occlusion of circumflex and had two drug-eluting stent  placed to the circumflex.  He had residual moderate LAD disease of 70%-  80% and residual 70%-80% RCA.  He had done well on the interim, so he  has been brought back for intervention to his RCA.   LABORATORY DATA:  On January 24, 2007, shows H&H 12.1 and 34.8, normal  indices, platelets 197, WBCs 12.  Sodium 140, potassium 4, BUN 18,  creatinine 1.8, glucose 126. EKG shows sinus bradycardia, with a  ventricular rate of 50, and normal sinus rhythm, normal axis, early R-  wave, nonspecific ST-T wave changes.   HOSPITAL COURSE:  Dr. Riley Kill performed cardiac catheterization on  January 23, 2007, utilizing a drug-eluting stent, opened the RCA lesion,  reducing this to 0% without difficulty.  Post sheath removal and  bedrest, the patient was ambulating without difficulty.  Overnight, he  did well.  Did not have any chest discomfort.  Catheterization site was  intact, and Dr. Riley Kill felt that the patient could be discharged home.   DISPOSITION:  Dr. Riley Kill discharged the patient home.  He did not  restrict his diet.  He advised the patient no driving for 2 days.   May  shower and bathe.  He informed the patient that the office will call him  with a follow-up appointment.   He asked him to continue his medications, which include:  1. Lisinopril 10 mg daily.  2. Coated aspirin 325 daily.  3. Plavix 75 daily.  4. Toprol-XL 25 mg 1/2 tablet daily.  5. Lipitor 80 mg daily.  6. Tracer study drug daily.  7. Ursodiol b.i.d.  8. Nitroglycerin as needed.   The office will call him a follow-up appointment.   DISCHARGE TIME:  25 minutes.      Joellyn Rued, PA-C      Arturo Morton. Riley Kill, MD, [facility]  Electronically Signed    EW/MEDQ  D:  01/24/2007  T:  01/24/2007  Job:  [id number]

## 2010-10-10 NOTE — Assessment & Plan Note (Signed)
[facility]                            CARDIOLOGY OFFICE NOTE   NYZAIAH, KAI                      MRN:          [id number]  DATE:03/31/2008                            DOB:          Nov 26, 1945    Mr. Coccia is in for a followup visit.  To briefly summarize, he is  doing relatively well.  He denies any ongoing chest pain.  He continues  to work at [employer] without difficulty.  Unfortunately, he does  not have a blood pressure cuff.  This is of some importance, given the  fact the he walks in today and remains hypertensive.  He remains on dual  antiplatelet therapy as the patient had a drug-eluting stents placed  both in the circumflex and right coronary artery.  He has had laboratory  studies done by Nadyne Coombes at [facility] recently.  Bruce swords  as his primary care physician.  This was done as part of the physical  required for occupational work at [employer].  His BUN was 18 and  creatinine 1.34.  His hemoglobin was 15 and his platelet count was 197.  Lipomet profile revealed a total cholesterol of 93 with an LDL  cholesterol of 28 and HDL of 35, interestingly enough.   He has had absolutely no chest pain whatsoever.   CURRENT MEDICATIONS:  1. Plavix 75 mg daily.  2. Toprol-XL 25 mg one-half tablet daily.  3. Lipitor 80 mg nightly.  4. TRACER study drug.  5. Ursodiol 300 mg b.i.d.  6. Lisinopril 20 mg daily.   PHYSICAL EXAMINATION:  VITAL SIGNS:  Today, the blood pressure is  170/98, the pulse is 60.  LUNG:  Fields are clear.  CARDIAC:  Rhythm is regular.   He remains clinically stable.  However, he needs to get a blood pressure  cuff and checked these on a regular basis.  He needs a followup blood  sugar and he needs a followup with Dr. Birdie Sons, continue to titrate  his medical regimen.  Cardiac-wise, he remains stable.  Given the fact  that he has stents in more than 1 vascular distribution and with  overlapping stents, my inclination would be to continue him on dual  antiplatelet therapy at the present time until a long-term clinical  trials are more evident.  He is in agreement with this approach.     Arturo Morton. Riley Kill, MD, [facility]  Electronically Signed    TDS/MedQ  DD: 04/02/2008  DT: 04/02/2008  Job #: [id number]

## 2010-10-10 NOTE — Assessment & Plan Note (Signed)
[facility]                         GASTROENTEROLOGY OFFICE NOTE   ZEBULIN, SIEGEL                      MRN:          [id number]  DATE:11/11/2006                            DOB:          1945-08-10    HISTORY:  Mr. Mesta presents today for followup.  He is a 65 year old  with a history of cholelithiasis for which he underwent cholecystectomy.  Also choledocholithiasis for which he has undergone ERCP with prior  sphincterotomy and stone extraction.  Despite adequate ductal clearance,  he has had recurrent problems with choledocholithiasis, requiring repeat  ERCP with stone extraction.  Most recent procedure was in June 2006.  He  has been on ursodiol in hopes of increasing bile flow and reducing the  risk of recurrent stone formation.  He was last evaluated in the office  September 24, 2005; see that dictation for details.  Since that time, he  has continued to experience minor episodes of epigastric discomfort  which last 1 to 2 minutes then resolve.  Yesterday he had more  significant bout.  No nausea, vomiting, fevers, jaundice or change in  urine color. His weight has been stable.  Currently he feels quite well.   CURRENT MEDICATIONS:  Include lisinopril, hydrochlorothiazide,  lovastatin and ursodiol 600 mg b.i.d.   PHYSICAL EXAMINATION:  Finds a well-appearing male in no acute distress.  Blood pressure is 124/78, heart rate is 76.  Weight is 190 lbs.  HEENT:  Sclerae anicteric.  LUNGS:  Clear.  HEART:  Regular.  ABDOMEN:  Soft without tenderness, masses or hernia.  Good bowel sounds  heard.   LABORATORIES:  Blood work from Sep 30, 2006 revealed normal liver  chemistries.   IMPRESSION:  Recurrent choledocholithiasis status post multiple  endoscopic retrograde cholangiopancreatographies with stone extraction.  Currently doing well with intermittent minor discomfort as described  above, really no change from last year.   RECOMMENDATIONS:  1. Continue ursodiol.  Prescription with multiple refills has been      provided.  2. Ongoing general medical care with Dr. Cato Mulligan.  3. GI followup in 1 year unless interval questions or problems.     Wilhemina Bonito. Marina Goodell, MD  Electronically Signed    JNP/MedQ  DD: 11/11/2006  DT: 11/11/2006  Job #: [id number]   cc:   [name], MD

## 2010-10-10 NOTE — H&P (Signed)
NAME:  JORGE, RETZ NO.:  [id number]   MEDICAL RECORD NO.:  [id number]          PATIENT TYPE:  OIB   LOCATION:  6524                         FACILITY:  [facility]   PHYSICIAN:  Arturo Morton. Riley Kill, MD, FACCDATE OF BIRTH:  December 15, 1945   DATE OF ADMISSION:  01/23/2007  DATE OF DISCHARGE:                              HISTORY & PHYSICAL   CHIEF COMPLAINT:  Heart attack.   HISTORY OF PRESENT ILLNESS:  Mr. Dutter is a very pleasant, 65 year old  gentleman who is well-known to me.  He presented January 07, 2007 with an  acute myocardial infarction secondary to occlusion of the circumflex.  His enzymes were significantly elevated, but he did not have remarkable  EKG changes.  He came to the catheterization laboratory and had a  totally occluded circumflex.  This was subsequently dilated but was a  somewhat difficult procedure because of tortuosity.  He subsequently has  had an uncomplicated course.  The patient has a previous history of  significant gallstones and has had recurrent common duct stones so has  been on a medication for this.  At the present time he is free of  recurrent angina since his infarct, he has high-grade residual disease  in the right coronary artery and a moderate stenosis in the left  anterior descending.  The plan has been to dilate the right coronary  artery and treat the LAD medically.  He has been on lipid lowering since  that time of discharge.   PAST MEDICAL HISTORY:  1. Hypertension.  2. Hyperlipidemia.  3. Cholecystitis status post cholecystectomy in 2004.  4. Recurrent choledocholithiasis in 2004 and 2006, both requiring      ERCP.   ALLERGIES:  No known drug allergies.  He has apparently have a SHRIMP  allergy.   CURRENT MEDICATIONS:  1. Aspirin 325 mg daily.  2. Lisinopril 10 mg daily.  3. Plavix 75 mg daily.  4. Toprol XL 25 mg 1/2-tablet daily.  5. Lipitor 80 mg q.h.s.  6. Tracer study drug Ursodiol 300 mg p.o. b.i.d.   SOCIAL  HISTORY:  The patient is retired from [employer]. He is a  nonsmoker and rarely uses alcohol.  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   FAMILY HISTORY:  His mother died of lung cancer and his father's health  status is unknown.   PHYSICAL EXAMINATION:  He is alert and oriented in no acute distress  today.  VITAL SIGNS:  Temperature 97.3, heart rate 56, respiratory rate 18,  blood pressure 128/74, 96% SAO2 sat. Height 5 feet 11, weight 84.5 kg.  LUNGS:  Lung fields are clear to auscultation and percussion.  PMI is  nondisplaced.  CARDIAC:  Rhythm is regular without murmur, rub or gallop.  ABDOMEN:  Soft. Distal pulses are intact.   IMPRESSION:  1. Non-ST elevation myocardial infarction treated with overlapping      drug-eluting stents.  2. High-grade residual disease of the right coronary artery.  3. Hypercholesterolemia now on lipid lowering therapy.   PLAN:  The plan has been to do a relook  of his left system. If this is  stable, our plan was to put a drug-eluting stent in the right coronary  artery.  The risks have been discussed with the patient in detail and he  consents to proceed.      Arturo Morton. Riley Kill, MD, [facility]  Electronically Signed     TDS/MEDQ  D:  01/23/2007  T:  01/24/2007  Job:  [id number]

## 2010-10-10 NOTE — Cardiovascular Report (Signed)
NAMEMarland Kim  MAHKI, SPIKES NO.:  [id number]   MEDICAL RECORD NO.:  [id number]          PATIENT TYPE:  INP   LOCATION:  2024                         FACILITY:  [facility]   PHYSICIAN:  Arturo Morton. Riley Kill, MD, FACCDATE OF BIRTH:  [birth year]   DATE OF PROCEDURE:  01/07/2007  DATE OF DISCHARGE:  01/10/2007                            CARDIAC CATHETERIZATION   INDICATIONS:  [name] is a 65 year old gentleman who presented to the  emergency room with chest pain.  He was seen by the cardiac team and did  not have definite electrocardiographic abnormalities.  There were  nonspecific ST changes.  However, his initial set of cardiac enzymes  revealed a CPK-MB of 73.1 and a troponin of 1.06.  He continued to have  mild chest discomfort and was brought to the catheterization laboratory  for further evaluation.   PROCEDURE:  1. Left heart catheterization.  2. Selective coronary arteriography.  3. Selective left ventriculography.  4. Percutaneous stenting of the circumflex coronary artery.   DESCRIPTION OF THE PROCEDURE:  The patient was brought to the  catheterization laboratory and prepped and draped in the usual fashion.  Through an anterior puncture the femoral artery was entered on a first  stick.  A 6-French sheath was placed.  Following this, views of the left  and right coronary arteries were obtained.  Central aortic and left  ventricular pressures were measured with a pigtail.  Ventriculography  was performed in the RAO projection.  The patient had evidence of a  total occlusion of the circumflex coronary artery with collateralization  distally.  He also had moderate disease in the LAD, but it did not  appear to be severe enough to warrant bypass on.  He also had a  moderately high-grade stenosis of the mid right coronary artery which is  amenable to percutaneous intervention.  With some ongoing discomfort it  was felt that we should proceed with percutaneous  intervention, and this  was discussed with the patient and subsequently with his wife.  Preparations were then made.  The patient was on an eptifibatide drip.  Heparin was given to prolong the ACT in an appropriate fashion.  Oral  clopidogrel was administered and the patient had received chewable  aspirin prior to procedure.  There was fairly good coaxial alignment  using a JL-4 catheter and we elected not to use a Voda, largely because  of the short left main.  A Prowater wire was placed down across the  total occlusion and crossed fairly easily.  Balloon dilatations were  then subsequently done using a 200 x 15 Maverick.  We subsequently  upgraded and used a 2.5 x 15 Maverick.  Beyond the total occlusion and  distal to an AV branch there was a second stenosis that measured 95%.  The total length area was over 30 mm.  At this point, we elected to go  ahead and attempt to pass a 33-mm Cypher drug-eluting stent.  Attempts  were made to get the stent down the artery.  However, there was a  calcified area of plaque in the mid vessel which  made this very  difficult despite predilatation.  Ultimately, we added a second wire,  and a buddy wire technique was utilized to try to get the stent down.  We had some difficulty getting the stent down at this point in time.  The area of coverage was fairly significant.  Ultimately, an 18-mm  length Promus 2.75 stent was then subsequently delivered with a buddy  wire technique.  This went down reasonably well to the distal lesion,  and then was subsequently deployed at about 14 atmospheres.  With  pullback of the balloon the more proximal area of disease was also  predilated again.  We then had a substantial amount of difficulty  getting the second stent down across the area of segmental disease.  This took quite some time.  Eventually, a Mailman wire was placed down  the vessel, and with a double-wire technique we were subsequently able  with some  considerable difficulty to get the second stent telescoped  into the first stent.  There was difficulty of movement not only at the  bend point in the artery, but also telescoping into the more proximal  stent.  We then proceeded on to place the first stent over the second  stent in telescoping fashion with a small amount of overlap.  This was  also deployed.  The balloon was slightly advanced and an additional  inflation in the overlap area applied.  Following this, we elected to go  ahead and actually put in a regular Maverick balloon for post  dilatation.  A 3.25 Maverick was passed.  See the American Journal of  Cardiology, August 1 edition.  The Maverick was passed into the vessel  and post dilatation done throughout the course of the stent.  Following  this, catheters were removed and a final angiographic study obtained.  The final angiographic result was excellent.  The fluoroscopy time was  approximately 70 minutes for the case, and this was explained to the  patient and his wife.  Overall, he tolerated this well without  complication.   HEMODYNAMIC DATA:  1. Central aortic pressure 123/74.  2. Left ventricular pressure 110/12.  3. No gradient pullback across aortic valve.   ANGIOGRAPHIC DATA:  1. Ventriculography in the RAO projection revealed a focal area of      wall motion abnormality involving the mid inferior wall      corresponding to the distribution of the total occlusion of the      circumflex.  The ejection fraction was preserved with an estimated      50%.  2. The left main was short, but calcified.  No significant area of      high-grade focal obstruction was noted.  3. The left anterior descending artery courses to the apex.  There are      multiple areas of focal abnormality beyond the septal perforator,      and prior to the diagonal there is an area of about 40% followed by      a secondary 40%.  Finally, there is a focal area of 60-70%      narrowing.  This  does not appear to be high-grade or probably flow-      limiting.  The distal vessel wraps the apex.  4. The circumflex provides a small marginal that has 50% narrowing and      then is totally occluded.  There is retrograde filling of this      vessel and it is a very  large vessel wrapping out over the apex.      Following reperfusion therapy with the 2-mm balloon, there was      evidence of a second stenosis of 90%.  The total area covered was      about 35 mm, and following balloon dilatation and stenting there      was 0% residual luminal narrowing with a slight step-up and step-      down and an excellent angiographic appearance.  There was perhaps      50% narrowing in the AV branch after stent deployment across this      area.  However, there was excellent TIMI 3 flow into both branches.  5. The right coronary artery also has segmental diffuse disease in the      proximal portion of the mid vessel leading into a bend.  There is      then about 70-80% focal area of stenosis just after the mid point      of the mid vessel.  The distal vessel is a fairly large-caliber      vessel that is free of critical disease.   CONCLUSION:  1. Non-ST-elevation acute myocardial infarction due to circumflex      occlusion with successful percutaneous stenting with overlapping      Promus stents.  2. Prolonged procedure secondary to difficulty with stent placement as      noted in the above text.  3. Residual coronary artery disease with moderate disease of the left      anterior descending artery and moderately severe disease of the      right coronary.   DISPOSITION:  The circumflex has been successfully opened with  overlapping Promus stents.  The right coronary artery is significantly  diseased and I have reviewed the films with Dr. Excell Seltzer.  My leaning  would be in the direction of percutaneous intervention of the right.  Importantly, the left anterior descending artery does not appear to  be  likely hemodynamically significant and therefore might not support a  graft.  We will try to treat the patient medically at this point in  time.  We will see him back in the office after discharge.  We will  monitor the patient for any skin changes.      Arturo Morton. Riley Kill, MD, [facility]  Electronically Signed     TDS/MEDQ  D:  01/10/2007  T:  01/10/2007  Job:  [id number]   cc:   [name], MD  [facility]

## 2010-10-10 NOTE — Assessment & Plan Note (Signed)
[facility]                                 ON-CALL NOTE   Kim, Christian                      MRN:          [id number]  DATE:07/11/2008                            DOB:          12/20/1945    PRIMARY CARE PHYSICIAN:  [name], MD   SUBJECTIVE:  Christian Kim has had several days of heel pain that was more  severe last night.  When he woke up this morning, it was much improved,  but then he started walking around, he had the pain again.  He states it  is about 1 inch above the back part of his heel towards his ankle.  He  denies any falls.   ASSESSMENT/PLAN:  Recommended using anti-inflammatory such as ibuprofen  800 mg every 8 hours along with ice.  He will call on Monday to make a  followup appointment with his doctor.     Kerby Nora, MD  Electronically Signed    AB/MedQ  DD: 07/11/2008  DT: 07/12/2008  Job #: [id number]

## 2010-10-10 NOTE — Assessment & Plan Note (Signed)
[facility]                            CARDIOLOGY OFFICE NOTE   SHAHZAIB, AZEVEDO                      MRN:          [id number]  DATE:10/09/2007                            DOB:          1946-01-11    Mr. Banks is in for followup.  He is doing quite well.  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  He has not had major  problems.  He previously underwent stenting of the circumflex coronary  artery with overlapping drug-eluting stents.  He had a high-grade  stenosis in the midportion of the right coronary artery and moderate  disease of the LAD.  He underwent stenting of the right coronary artery  with a 3.0 x 15 Kronus drug-eluting stent.  He has continued on dual  antiplatelet therapy.  He has had recent laboratory studies done by Dr.  Cato Mulligan.  Liver function studies were normal.  The TSH was normal.  The  direct LDL was 75.  Followup with Dr. Cato Mulligan has been called,  and he  has had apparently repeat laboratory studies recently.  These will be  followed by Bruce.  His last chest x-ray was in August, and at that time  he had low volumes but no focal lung space disease.   MEDICATIONS:  1. Enteric-coated aspirin 325 mg daily.  2. Plavix 75 mg daily.  3. Toprol XL 25 mg 1/2 tablet daily.  4. Lipitor 80 mg q.h.s.  5. Tracer study drug.  6. Ursodiol 300 mg b.i.d.   PHYSICAL EXAMINATION:  GENERAL:  He is alert and oriented in no acute  distress.  VITAL SIGNS:  Blood pressure is 164/90, pulse is 56.  LUNGS:  The lung fields are clear.  CARDIAC:  The cardiac rhythm is regular.  There is not a significant  murmur noted.  EXTREMITIES:  There is no extremity edema.   EKG reveals sinus bradycardia, otherwise within normal limits.   IMPRESSION:  1. Coronary artery disease status post two-vessel percutaneous      coronary intervention using drug-eluting stents.  2. Hypertension.  3. Hypercholesterolemia on lipid lowering  therapy, near target.  4. Cholecystitis, status post cholecystectomy in 2004 with recurrent      cholelithiasis in 2004 and 2006, both requiring ERCP.   PLAN:  1. Return to clinic in 6 months.  2. At 1-year he can reduce his aspirin to 81 mg daily.  3. He will continue followup with me in the clinic and Dr. Cato Mulligan for      a general medical care.     Arturo Morton. Riley Kill, MD, [facility]  Electronically Signed    TDS/MedQ  DD: 10/12/2007  DT: 10/12/2007  Job #: [id number]   cc:   [name], MD

## 2010-10-10 NOTE — Cardiovascular Report (Signed)
NAME:  FREDRICK, GEOGHEGAN NO.:  [id number]   MEDICAL RECORD NO.:  [id number]          PATIENT TYPE:  OIB   LOCATION:  6524                         FACILITY:  [facility]   PHYSICIAN:  Arturo Morton. Riley Kill, MD, FACCDATE OF BIRTH:  Mar 29, 1946   DATE OF PROCEDURE:  01/23/2007  DATE OF DISCHARGE:                            CARDIAC CATHETERIZATION   INDICATIONS:  Mr. Spirito is a very delightful 65 year old gentleman well-  known to me.  He previously presented with a myocardial infarction and  had stenting of the circumflex coronary artery with overlapping drug-  eluting stents.  At that time, he had a high-grade stenosis in the  midportion of the right coronary artery.  He has moderate disease in the  LAD.  We felt that the LAD should be treated medically, and the right  coronary should be opened.  He was brought back today for further  evaluation including relook at the previous site, and possible  percutaneous coronary intervention of the right coronary artery.   PROCEDURE:  1. Selective coronary arteriography.  2. Percutaneous stenting of the right coronary artery with a drug-      eluting platform.   DESCRIPTION OF PROCEDURE:  The patient was brought to the  catheterization laboratory and prepped and draped in usual fashion.  Through an anterior puncture the left femoral artery was entered on the  first stick.  A 6-French sheath was then placed.  Following this, views  of the left coronary were done to ensure continued integrity of the  previously placed stent, and also visualize the LAD on a follow-up  study.  Following this, bivalirudin was given according to protocol, and  ACT checked and found to be appropriate for percutaneous coronary  intervention.  A JR-4 guiding catheter with side holes was then passed  into the right coronary artery.  With adequate ACT, the vessel was  easily crossed using a Luge wire.  The initial intent had been to  potentially treat the entire  mid right coronary artery, but the more  proximal portion appeared to be improved.  Moreover, in looking at the  length, it was felt that this would likely end up requiring two  overlapping stents.  As a result, I made the decision to treat the  severe portion of the of the lesion.  Predilatation was done with a 12-  mm balloon, and stenting was accomplished with a 3.0 x 15 Promus drug-  eluting stent.  Initial deployment was to 15 atmospheres.  Following  this, a 3.5 Quantum Maverick was taken into the stent and dilated both  distally and proximally, and the midportion of the stent was taken to as  high as 15 atmospheres.  There was an excellent angiographic appearance.  The more proximal vessel appeared to be segmentally plaqued, but without  highly obstructive disease, and therefore, the decision was made not to  stent the more proximal area.  Fluoro time was kept less than 10 minutes  given the previous procedure.  All catheters were subsequently removed,  and the femoral sheath was sewn into place.  He was taken  to the holding  area in satisfactory clinical condition.   ANGIOGRAPHIC DATA:  1. The left main coronary artery is very minimally tapered, being no      more than about 20%.  2. The previous infarct artery has overlapping Promus stents.  It is      widely patent without significant narrowing.  There is an AV branch      that comes out in the middle of the stented area with about 30-40%      narrowing.  There is excellent TIMI 3 runoff into the distal      vessel, and absolutely no evidence of any residual thrombus or      difficulty.  3. The left anterior descending artery courses to the apex.  There is      a relatively focal plaque of about 60%, although the entire vessel      looks mildly irregular.  This is unchanged from the acute study.  4. The right coronary artery is a moderate caliber vessel providing a      posterior descending and posterolateral system.  There is  some      luminal irregularity in the proximal vessel and some diffuse      narrowing in the proximal portion of the mid vessel just above the      first steep bend.  At this bend, there is about a focal 50% area of      fairly stable-appearing plaque.  Distal to this, there is an 80%,      somewhat segmental plaque leading into the distal vessel.  This 80%      plaque was successfully stented using a 3.0 x 15 Promus stent that      was post dilated to a 3.5 with an excellent angiographic      appearance, no evidence of edge tear, and no evidence of      intraluminal thrombus.  Distal runoff was excellent.   CONCLUSION:  1. Continued patency of the circumflex infarct artery with previous      two drug-eluting stents.  2. Successful percutaneous stenting of a high-grade stenosis in the      distal portion of the midvessel of the right coronary artery  3. Residual diffuse plaque involving the more proximal portion of the      right coronary artery and mid LAD as described above.   DISPOSITION:  1. The patient will be treated with aspirin and Plavix.  2. Aggressive lipid management will be recommended with a goal of LDL      of less than 70.  Continued follow-up in the office will also be      recommended.      Arturo Morton. Riley Kill, MD, [facility]  Electronically Signed     TDS/MEDQ  D:  01/23/2007  T:  01/24/2007  Job:  [id number]   cc:   [name], MD  [facility] Laboratory

## 2010-10-10 NOTE — Discharge Summary (Signed)
[name], [name] NO.:  [id number]   MEDICAL RECORD NO.:  [id number]          PATIENT TYPE:  INP   LOCATION:  2024                         FACILITY:  [facility]   PHYSICIAN:  Christian Kim. Christian Kim, Christian Kim, FACCDATE OF BIRTH:  09/10/1945   DATE OF ADMISSION:  01/07/2007  DATE OF DISCHARGE:  01/10/2007                               DISCHARGE SUMMARY   PRIMARY CARDIOLOGIST:  Dr. Riley Kim.   PRIMARY CARE PHYSICIAN:  Dr. Birdie Kim.   DISCHARGE DIAGNOSES:  Non-ST elevation myocardial infarction/coronary  artery disease.   SECONDARY DIAGNOSES:  1. Hypertension.  2. Hyperlipidemia.  3. Cholecystitis, status post cholecystectomy in 2004.  4. Recurrent cholelithiasis in 2004 and 2006, both times requiring      endoscopic retrograde cholangiopancreatography.   ALLERGIES:  No known drug allergies.   PROCEDURES:  Left heart cardiac catheterization with successful PCI and  stenting of the mid left circumflex, with placement of two 2.75 x 18 mm  PROMUS drug-eluting stents.   HISTORY OF PRESENT ILLNESS:  A 65 year old Caucasian male without prior  history of CAD, who presented to the [facility] ED January 07, 2007  following a three-day history of right sided chest aching with radiation  down both arms, occurring most significantly when exerting himself or  lifting.  Symptoms have been stuttering over several days prior to  admission, and have resolved with rest.  He had return symptoms on the  evening of admission prompting his presentation.  In the ED, his  troponin was 0.84 with a CK-MB of 22.  His ECG showed 1 mm ST depression  and one in AVL, with 1 mm ST elevation in lead III.  The patient was  initiated on heparin, integralin, aspirin and statin therapy, and  admitted to CCU for further management of non-ST elevation MI.   HOSPITAL COURSE:  [name] was taken to the cath lab later in the day  on August 12 and underwent left heart cardiac catheterization, revealing  an occluded mid-left circumflex with a 60-70% stenosis in the mid to  distal LAD, and a 70-80% stenosis in the mid RCA.  The circumflex was  felt to be the culprit lesion, and this was successfully stented with  two 2.75 x 18 mm PROMUS drug-eluting stents.  He tolerated this  procedure well, and was taken back to the CCU for continued monitoring.  He had no recurrent chest discomfort, and the decision was made to defer  any intervention on the right coronary artery, and rather try aggressive  medical therapy for now.  He was transferred out to the floor on August  14, and has been ambulating without recurrent symptoms or limitations.  He will be discharged home today in satisfactory condition.   DISCHARGE LABORATORY DATA:  Hemoglobin 12.8, hematocrit 36.7, WBC 6.9,  platelets 178, MCV 93.3.  Sodium 140, potassium 3.7, chloride 106, CO2  of 27, BUN 16, creatinine 1.33, glucose 112.  Total bilirubin 0.7,  alkaline phosphatase 82, AST 45, ALT 26, albumin 3.4.  Peak CK is 1141,  peak MB 178.3, peak troponin-I 25.82.  Total cholesterol 127,  triglycerides 124, HDL 31, LDL 71.  Calcium 8.6, magnesium 2.1.  A hep  rapid screen was positive for heparin antibody.  A formal hep panel is  pending.  Hemoglobin A1c is 5.3, TSH 2.424, BNP 42.0, D-dimer 0.62 on  admission.   DISPOSITION:  The patient is being discharged home today in good  condition.   FOLLOWUP PLANS AND APPOINTMENTS:  The patient is asked to follow up with  Dr. Cato Kim as previously scheduled.  He has followup with Dr. Shawnie Kim on September 12 at 4 p.m.   DISCHARGE MEDICATIONS:  1. Aspirin 325 mg daily.  2. Plavix 75 mg daily.  3. Toprol-XL 25 mg half a tablet daily.  4. Lipitor 80 mg q.h.s.  5. Tracer study drug daily, or as previously prescribed.  6. Lisinopril 10 mg daily.  7. Ursodiol 300 mg b.i.d.  8. Nitroglycerin 0.4 mg sublingual p.r.n. chest pain.   OUTSTANDING LAB STUDIES:  Hep panel is pending.   DURATION  OF DISCHARGE ENCOUNTER:  45 minutes including physician time.      Christian Kim, Christian Kim      Christian Kim. Christian Kim, Christian Kim, Christian Kim  Electronically Signed    CB/MEDQ  D:  01/10/2007  T:  01/10/2007  Job:  [id number]   cc:   Christian Kim, Christian Kim

## 2010-10-10 NOTE — Discharge Summary (Signed)
NAMEMarland Kitchen  RAGAN, REALE NO.:  [id number]   MEDICAL RECORD NO.:  [id number]          PATIENT TYPE:  INP   LOCATION:  2024                         FACILITY:  [facility]   PHYSICIAN:  Arturo Morton. Riley Kill, MD, FACCDATE OF BIRTH:  08-30-1945   DATE OF ADMISSION:  01/07/2007  DATE OF DISCHARGE:  01/10/2079                               DISCHARGE SUMMARY   ADDENDUM:  With regards to the patient's followup plans and  appointments, we changed his followup to August 20 at 2:45 p.m. to see  Dr. Riley Kill, with plans for a PCI of the RCA within the next 2 weeks.      Nicolasa Ducking, ANP      Arturo Morton. Riley Kill, MD, [facility]  Electronically Signed    CB/MEDQ  D:  01/10/2007  T:  01/10/2007  Job:  [id number]

## 2010-10-10 NOTE — H&P (Signed)
NAME:  Christian Kim, SPLAWN NO.:  [id number]   MEDICAL RECORD NO.:  [id number]          PATIENT TYPE:  EMS   LOCATION:  MAJO                         FACILITY:  [facility]   PHYSICIAN:  Christell Faith, MD   DATE OF BIRTH:  08/16/1945   DATE OF ADMISSION:  01/07/2007  DATE OF DISCHARGE:                              HISTORY & PHYSICAL   PRIMARY CARE PHYSICIAN:  [facility].   CHIEF COMPLAINT:  Chest pain.   HISTORY OF PRESENT ILLNESS:  This is a 65 year old white man with no  prior cardiac history who presents with 3 days of substernal and right-  sided chest aching.  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  He  first noticed this Friday night, again Sunday and then finally tonight.  In each episode, he was working on his farm.  The pain is described as a  big knot in the middle of my chest.  It radiates down both arms, but  right greater than left.  Tonight, it took over an hour of rest for the  pain to resolve, however, when he presented to the emergency room, he  had recurrence of 6/10 pain.  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  With the chest discomfort, he has associated shortness of breath  and diaphoresis.  There is no radiation to the back, neck or jaw, he  does feel like he has a tightness in his throat, however.  The patient  is currently on a heparin and nitroglycerin drip at 10 mcg a minute  started by emergency room physician and is almost pain-free.  He  currently rates his pain 1/10.   REVIEW OF SYSTEMS:  Denies fevers, chills, sweats, nausea, vomiting,  diarrhea, weight change, weakness, numbness, headache, sore throat,  orthopnea, PND, edema, palpitations, syncope, presyncope and  claudication.  The balance of 14 systems are reviewed and negative.   PAST MEDICAL HISTORY:  1. Hypertension.  2. Hyperlipidemia.  3. Cholecystitis in 2004, for which he underwent  cholecystectomy.  4. Recurrent choledocholithiasis in 2004, and again in 2006, both      times requiring ERCP.   ALLERGIES:  No known drug allergies.   MEDICATIONS:  Lisinopril, Lovastatin and Ursodiol.   SOCIAL HISTORY:  He lives in [place] with his wife.  He is retired  from the [employer].  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  He is a life-long nonsmoker and rarely  uses alcohol.   FAMILY HISTORY:  His mother died of lung cancer.  His father's health  status is unknown.  He has two half sisters who he believes are healthy.   PHYSICAL EXAMINATION:  VITAL SIGNS:  Temperature 98.1, pulse 55,  respirations 12, blood pressure 164/79, saturations 95% on room air.  GENERAL:  This is a pleasant, white man in no distress.  He appears  somewhat younger than his stated age.  HEENT:  Pupils are round and reactive.  Sclerae clear.  Extraocular  movements  intact.  Mucous membranes are moist.  NECK:  Supple.  No elevation of JVD.  No carotid bruits.  No  thyromegaly.  LUNGS:  Clear to auscultation bilaterally without wheezing or rales.  CARDIAC:  Bradycardic rate, regular rhythm, no murmurs or gallops.  ABDOMEN:  Soft, nontender, nondistended, normal bowel sounds.  EXTREMITIES:  No edema, 2+ dorsalis pedis and radial pulses bilaterally.  Right femoral pulse 2+ without bruit.  SKIN:  No rash.  NEUROLOGIC:  He is awake, alert, oriented x3.  Facial expressions are  symmetric with 5/5 strength in all four extremities.   LABORATORY DATA AND X-RAY FINDINGS:  Chest x-ray shows low lung volumes,  but no acute process.  EKG shows sinus bradycardia with a rate of 55, 1  mm ST depression in leads I and aVL, isolated ST elevation at 1 mm in  lead III.  This is not thought to represent injury pattern as there is  no ST elevation in contiguous lead.   White blood cells 7.3, hemoglobin 15.5, platelets 232.  Sodium 139,  potassium 3.7, bicarb 18, BUN 18,  creatinine 1.5, glucose 124.  CK-MB  22, troponin 0.84.   IMPRESSION:  A 65 year old, white man with non-ST-elevation myocardial  infarction, currently with 1/10 chest pain and an abnormal EKG.   PLAN:  1. Admit to the coronary care unit and continue to cycle cardiac      enzymes and EKGs.  2. Continue the heparin drip initiated in the emergency department.  3. Initiate Integrilin, and continue aspirin and Statin.  4. We will withhold beta-blocker at this time given his resting heart      rate of 48-55 beats a minute.  5. Increase nitroglycerin drip in an effort to make him completely      pain-free.  6. Will plan for a left heart catheterization with coronary angiogram      and possible intervention in the morning.  He will be made n.p.o.      If his EKG develops an injury pattern or if we are unable to make      him completely pain-free, we will proceed with emergent      catheterization tonight.  7. Serum creatinine is abnormal at 1.5.  We will hydrate and repeat      creatinine in anticipation of catheterization.  8. Serum glucose is noted to be abnormal.  Will check fasting glucose,      hemoglobin A1c and fasting lipid panel.  9. He will probably be a good cardiac rehabilitation candidate.      Christell Faith, MD  Electronically Signed     NDL/MEDQ  D:  01/07/2007  T:  01/07/2007  Job:  [id number]

## 2010-10-10 NOTE — Letter (Signed)
January 15, 2007    Bruce H. Swords, MD  [address] [place], [place] [address]   RE:  Christian Kim, Christian Kim  MRN:  [id number]  /  DOB:  07-22-1945   Dear Smitty Cords:   I had the pleasure of seeing Keene Gilkey in the office today in  followup.  He presented to the hospital.  He had some chest pain.  Enzymes turned out to be positive, and he underwent urgent  catheterization.  This revealed a total occlusion of the circumflex  coronary artery.  He had a 50% to 60% lesion of the mid left anterior  descending artery, and a 70% to 80% lesion of the mid right.  The  circumflex was somewhat difficult because of calcification, but we were  eventually able to treat it with 2 overlapping drug-eluting stents.  He  had an excellent angiographic result, and has done well since discharge.  He did have fluoro time of about 70 minutes, but has not had any  radiation changes whatsoever.  I have reviewed this with him in detail.   Today, on examination the blood pressure was 110/66.  The pulse was 61.  LUNG FIELDS:  Clear.  CARDIAC:  Rhythm was regular.  SKIN:  Reveals absolutely no changes.   Overall, the patient is doing well from a clinical standpoint.  I plan  to see him back in the hospital in about 10 days for a percutaneous  intervention on his right coronary artery.  Hopefully, he will  not have any symptoms in the interim.  We have asked him to take it  easy.  He will return to work in about 3 weeks.  When I see him back, we  will get a lipid profile.   Thanks for allowing me to share in his care.    Sincerely,      Arturo Morton. Riley Kill, MD, [facility]  Electronically Signed    TDS/MedQ  DD: 01/15/2007  DT: 01/16/2007  Job #: [id number]

## 2010-10-13 NOTE — Op Note (Signed)
NAME:  Christian Kim, Christian Kim               ACCOUNT NO.:  [id number]   MEDICAL RECORD NO.:  [id number]          PATIENT TYPE:  AMB   LOCATION:  ENDO                         [facility]ACILITY[facility]  [facility]   PHYSICIAN:  Wilhemina Bonito. Marina Goodell, M.D. [facility] OF BIRTH:  21-Jul-1945   DATE OF PROCEDURE:  09/21/2004  DATE OF DISCHARGE:                                 OPERATIVE REPORT   PROCEDURE:  Endoscopic retrograde cholangiography with common bile duct  stone extraction.   INDICATIONS FOR PROCEDURE:  Abdominal pain and a prior history of  choledocholithiasis.   HISTORY:  This is a 65 year old gentleman with a history of cholelithiasis  and choledocholithiasis which he has undergone prior laparoscopic  cholecystectomy as well as ERCP with stone extraction on several occasions.  The most recent ERCP was performed in July of 2004 at which time inspissated  bile and a small cholesterol stone were extracted. He did well until the  first part of this month when he returned after reporting episodic problems  with sharp abdominal pain lasting approximately 10 minutes. The pain was  reminiscent of prior biliary colic. He is now for ERCP with possible stone  extraction. The nature of the procedure as well as the risks, benefits, and  alternatives were again carefully reviewed. He understood and agreed to  proceed.   PHYSICAL EXAMINATION:  Well appearing male in no acute distress. He is alert  and oriented. Vital signs are stable. Lungs are clear. Heart is regular.  Abdomen is soft.   DESCRIPTION OF PROCEDURE:  After informed consent was obtained, the patient  was sedated over the course of the procedure with 125 mg of Demerol and 12  mg of Versed IV. Glucagon 0.5 mg IV was administered as a duodenal relaxant.  The Olympus side viewing endoscope was then passed blindly into the  esophagus. The stomach was unremarkable. The duodenal bulb revealed patchy  erythema without erosion or ulceration. The postbulbar duodenum was  unremarkable. The minor ampulla was not sought. The major ampulla was buried  amidst multiple redundant duodenal folds. Evidence of prior sphincterotomy  was apparent. The sphincterotomy size was deemed moderate to large. The  common bile duct was then freely cannulated. Injection of contrast  throughout the biliary system revealed multiple irregular filling defects in  the distal bile duct. The bile duct diameter was approximately 9 mm in the  mid portion. Through the cannulating catheter, a hydrophilic wire was placed  in the proximal biliary tree. The catheter was exchanged for a sequential  balloon. Subsequently multiple sweeps of the common bile duct were performed  using a combination of 12 and 8.5 mm balloon. Very soft yellow stones in  significant amounts were extracted. Again almost inspissated bowel, this  material was quite soft. After multiple sweeps of the bile duct, it was  clear that there were no residual filling defects. Dr. Lorin Picket of radiology  was also present during this portion of the exam. Despite clearing the bile  duct, drainage of the system was quite poor without obstructive lesion  present. I should note that scout radiograph initially obtained revealed air  in the biliary tree as well as cholecystectomy clips. As well, no attempt at  pancreatic duct cannulation and no pancreatic duct opacification. The  patient was then moved without incident to recovery.   IMPRESSION:  Recurrent choledocholithiasis. The patient seems to have an  __________ in his bile duct which predisposes him to recurrent stone or  inspissated bowel formation. His sphincterotomy size is deemed adequate. At  this point, I would like to place him on Ursodiol 600 mg twice daily to try  to increase bowel flow and reduce the risk of recurrent stone formation. As  well, we will provide him with three days of prophylactic ciprofloxacin. I  would like to see the patient in the office for followup in  about two weeks.      JNP/MEDQ  D:  09/21/2004  T:  09/21/2004  Job:  [id number]   cc:   [name], M.D. [facility]

## 2010-10-13 NOTE — H&P (Signed)
NAME:  Christian Kim, Christian Kim                         ACCOUNT NO.:  [id number]   MEDICAL RECORD NO.:  [id number]                   PATIENT TYPE:  INP   LOCATION:  5732                                 FACILITY:  [facility]   PHYSICIAN:  [name], M.D. [facility]           DATE OF BIRTH:  November 06, 1945   DATE OF ADMISSION:  10/29/2002  DATE OF DISCHARGE:                                HISTORY & PHYSICAL   HISTORY OF PRESENT ILLNESS:  Mr. Sanden is a 65 year old male who comes in  after evaluation yesterday.  He has complained of four to five episodes of  intense epigastric to right-sided pain which he describes as a cramping,  knife-like sensation two nights ago associated with chills and sweats, but  no documented fever (temperature yesterday in the office was 100.8).  Yesterday on exam, he looked a little jaundice and icteric.  He was sent for  laboratories and ultrasound of his abdomen.  Preliminary report of the  abdominal ultrasound with mildly thickened gallbladder wall with bile duct  at upper limits of normal and the ultrasound technician felt maybe there was  sludge or fine sand particles.  He continues to have moderate pain  throughout today.  Laboratories received today demonstrated a white count of  15,000, bilirubin 6.6, direct bilirubin 3.1, Alk phos 214, SGOT 277, SGPT  376.   PAST MEDICAL HISTORY:  Generally healthy.   PAST SURGICAL HISTORY:  Nasal septal repair.   FAMILY HISTORY:  Mother deceased with lung cancer, age 66.  Father deceased  from unknown cause.  He has two 1/2 sisters, both of whom are healthy.   SOCIAL HISTORY:  He works for the [employer]. [employer].  He is a retired  TEFL teacher.  He does not smoke.  He drinks alcohol occasionally.  He  is married and has two healthy sons.   CURRENT MEDICATIONS:  None.   REVIEW OF SYMPTOMS:  CARDIOPULMONARY:  Denies any chest pain, shortness of  breath, PND or orthopnea.  INTEGUMENTARY:  He does admit to diffuse  pruritus, but no rash.  He denies any other complaints on review of systems.   PHYSICAL EXAMINATION:  VITAL SIGNS:  Temperature 99, pulse 60, respirations  14, blood pressure 120/80.  GENERAL:  Well-developed, well-nourished male in no acute distress.  He is  mildly jaundice.  HEENT:  Atraumatic, normocephalic, extraocular movements intact.  Conjunctivae are icteric.  NECK:  Supple without lymphadenopathy, thyromegaly, jugular venous  distention or carotid bruits.  CHEST:  Clear to auscultation without any increased work of breathing.  CARDIAC:  S1, S2 normal without murmurs, rubs or gallops.  ABDOMEN:  Active bowel sounds, soft, nondistended, no masses.  He has some  tenderness to the epigastric area.  EXTREMITIES:  There is no clubbing, cyanosis or edema.   LABORATORY DATA AND X-RAY FINDINGS:  As above.    ASSESSMENT/PLAN:  I expect the patient has biliary colic,  transamination and  leukocytosis.  Will put the patient in the hospital and obtain a computed  tomography of the abdomen and pelvis.  Will have Dr. Abbey Chatters see the  patient (his office was contacted).                                               Bruce Rexene Edison Swords, M.D. [facility]    BHS/MEDQ  D:  10/30/2002  T:  10/30/2002  Job:  [id number]   cc:   Adolph Pollack, M.D.  [address]  [place]  [place] [address]  Fax: [phone]

## 2010-10-13 NOTE — Assessment & Plan Note (Signed)
[facility]                            CARDIOLOGY OFFICE NOTE   Christian Kim, RINKS                      MRN:          [id number]  DATE:02/14/2007                            DOB:          Feb 23, 1946    I reviewed in detail Mr. Eber forms to return to work.  He has  called on several occasions.  He has spoken to me, and explained his  work situation in detail.  For example, although the form requires that  he be able to go 2 flights of stairs, he says that in fact in their  building there is only real 1 flight of stairs, and he says none of the  work is really that strenuous.  I pointed out to him that while I truly  believe he could do all of these things based on his exercise  radionuclide imaging study, I obviously have some concerns given the  fact that he had a myocardial infarction, and has stent placed to the  circumflex coronary artery, which was occluded.  In general, he has done  extremely well, and he was able to complete a radionuclide imaging study  with exercise, which demonstrated no inducible ischemia.  Therefore, I  do think that his ability to do everything that he tells me that he is  required to do can be accomplished.  While he does meet the required 29  criteria, I have reviewed these 29 criteria, and he suggested that in  fact most of these never, if ever, occur.  He probably could do most of  these. Although, in fact, what the document really likely is, is a  release from liability form for the company for which he works, and in  fact is probably written by an attorney for that specific reason.  Nonetheless, it is likely at this point that the patient can do any and  all of those things.  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  He and I discussed this in great detail.   He can also not  remember at any time when he was ever in hot pursuit.  He also mentioned  that they work in pairs, and there would be almost under no circumstance  where he would be required to do something dramatic on his own.  We,  therefore, have approved him to go back to work.  I will see him back in  followup in a few weeks.     Arturo Morton. Riley Kill, MD, [facility]  Electronically Signed    TDS/MedQ  DD: 02/14/2007  DT: 02/15/2007  Job #: [id number]

## 2010-10-13 NOTE — Discharge Summary (Signed)
NAME:  Christian Kim, Christian Kim                         ACCOUNT NO.:  [id number]   MEDICAL RECORD NO.:  [id number]                   PATIENT TYPE:  INP   LOCATION:  5732                                 FACILITY:  [facility]   PHYSICIAN:  Rene Paci, M.D. [facility]          DATE OF BIRTH:  Mar 21, 1946   DATE OF ADMISSION:  10/29/2002  DATE OF DISCHARGE:  11/04/2002                                 DISCHARGE SUMMARY   DISCHARGE DIAGNOSES:  1. Nausea, vomiting, and abdominal pain secondary to cholecystitis,     cholelithiasis, status post laparoscopic cholecystectomy.  One residual     cystic duct stone on postoperative patient followup.  2. Elevated liver function tests secondary to above, improving.  3. Hypertension.   DISCHARGE MEDICATIONS:  As prior to admission and include:  1. Lisinopril 10 mg p.o. daily.  2. Hydrochlorothiazide 25 mg p.o. daily.   CONSULTATIONS:  1. John N. Marina Goodell, M.D., [facility], doing an ERCP on June 8.  2. Adolph Pollack, M.D. performed a cholecystectomy with intraoperative     cholangiogram on June 5 by Dr. Ezzard Standing.   CONDITION ON DISCHARGE:  Medically improved but stable.   FOLLOW UP:  Within 48 hours with his primary care physician, Dr. Birdie Sons.  The patient is to return to the emergency room with fevers, further  abdominal pain, or intolerance p.o. occurs in the 48 hours prior to re-  evaluation.   HOSPITAL COURSE:  #1.  ABDOMINAL PAIN WITH SECONDARY CHOLELITHIASIS:  The patient is pleasant  65 year old white gentleman who presented to his primary care physician's  office on the day of admission with abdominal pain.  Lab workup showed  elevated LFTs with suspicion of gallbladder .  He was admitted to hospital  for further workup.  Ultrasound reviewed by Dr. Juanda Chance with concern for  possible extrahepatic obstruction. CT scan was obtained as well as a  surgical consult.  CT showed no pancreatitis, pancreatic mass, and distal  common bile  duct filling defects consistent with choledocholithiasis.  He  was, therefore, taken to surgery on June 5, for removal of gallbladder.  The  intraoperative cholangiogram showed at least one retained stone.  GI was  consulted for possible ERCP.  This was performed on June 8 and noted three  retained stones.  There was still one stone remaining that was not extracted  during the procedure, but he was felt to be stable for discharge home as his  symptoms had resolved.  His LFTs had trended down.  He has close followup  with primary care physician.  He is tolerating diet which is low-fat, low-  residue without difficulty.   #2.  OTHER CHRONIC MEDICAL ISSUES:  The patient's hypertension was well  controlled during his hospitalization, and no changes were made in his  medical regimen.  Rene Paci, M.D. [facility]    VL/MEDQ  D:  01/07/2003  T:  01/08/2003  Job:  [id number]

## 2010-10-13 NOTE — Op Note (Signed)
NAME:  JEYREN, DANOWSKI                         ACCOUNT NO.:  [id number]   MEDICAL RECORD NO.:  [id number]                   PATIENT TYPE:  INP   LOCATION:  5732                                 FACILITY:  [facility]   PHYSICIAN:  Wilhemina Bonito. Marina Goodell, M.D. [facility]             DATE OF BIRTH:  May 19, 1946   DATE OF PROCEDURE:  11/03/2002  DATE OF DISCHARGE:                                 OPERATIVE REPORT   PROCEDURE PERFORMED:  Endoscopic retrograde cholangiography with biliary  sphincterotomy and common bile duct stone extraction.   INDICATIONS FOR PROCEDURE:  Retained common bile duct stone post  cholecystectomy.   HISTORY:  The patient is a pleasant 65 year old white male who presented  acutely October 29, 2002 with abdominal pain and jaundice.  He underwent  endoscopic retrograde cholangiopancreatography with Dr. Claudette Head October 30, 2002.  Biliary sphincterotomy was performed and common bile duct stone  was extracted.  On October 31, 2002 he underwent laparoscopic cholecystectomy.  He was found to have chronic cholecystitis with cholelithiasis.  Intraoperative cholangiogram revealed at least one possibly more retained  common bile duct stones.  The patient had worsening liver function tests  postoperatively.  These have slowly improved though quite incompletely.  Clinically, he has done well.  X-rays were reviewed and he was clearly felt  to have retained common duct stones.  He is now for repeat ERCP with  possible sphincterotomy extension and stone extraction.  The nature of the  procedure as well as the risks, benefits and alternatives have been  reviewed.  He understood and agreed to proceed.   PHYSICAL EXAMINATION:  GENERAL:  The patient is a well appearing male in no  acute distress.  He is alert and oriented.  Vital signs are stable.  HEENT:  The sclerae are icteric.  LUNGS:  Clear.  HEART:  Regular.  ABDOMEN:  Soft and mildly tender with good bowel sounds.   DESCRIPTION OF PROCEDURE:   After informed consent was obtained, the patient  was sedated with 100 mg of Demerol and 10 mg of Versed IV.  Glucagon 0.5 mg  IV was given as a duodenal relaxant.  Unasyn IV was continued  preprocedurally.  The Olympus side-viewing endoscope was then passed blindly  into the esophagus.  The stomach and duodenum were normal.  The postbulbar  duodenum revealed prior evidence of biliary sphincterotomy.  The  sphincterotomy site was edematous and the intra-ampullary diameter of the  prior cut appeared small.   X-RAY FINDINGS:  1. Scout radiograph of the abdomen with the endoscope and _______  revealed     surgical  clips.  Otherwise normal.  2. No attempt at a pancreatogram was made.  3. The common bile duct was freely cannulated.  Complete filling of the     biliary tree revealed several filling defects in the common bile duct.     In addition, the long cystic  duct remnant.  4. No other abnormalities.   THERAPY:  The biliary sphincterotomy was extended using a sphincterotome  over a hydrophilic guidewire.  Sphincterotomy was performed in the 12  o'clock orientation.  Cutting was achieved via the Erbe system.  The cutting  catheter was then exchanged for a 12.5 mm balloon.  Three stones measuring 9  mm, 5 mm and 4 mm were systematically extracted.  Post extraction occlusion  cholangiogram revealed no residual filling defects in the bile duct.  There  did appear to be a 1 mm filling defect in the proximal cystic duct remnant.   Drainage post stone extraction was good.   IMPRESSION:  Choledocholithiasis post laparoscopic cholecystectomy, status  post repeat ERCP with sphincterotomy extension and extraction of multiple  common duct stones.   RECOMMENDATIONS:  1. Continue intravenous antibiotics.  2. If stable, anticipate discharge tomorrow with usual postoperative follow-     up.                                               Wilhemina Bonito. Marina Goodell, M.D. [facility]    JNP/MEDQ  D:  11/03/2002  T:   11/03/2002  Job:  [id number]   cc:   Sandria Bales. Ezzard Standing, M.D.  [address]  [place]  [place] [address]  Fax: [phone]   [name], M.D. [facility]   Malcolm T. Russella Dar, M.D. [facility]

## 2010-10-13 NOTE — Op Note (Signed)
NAME:  Christian Kim, Christian Kim                         ACCOUNT NO.:  [id number]   MEDICAL RECORD NO.:  [id number]                   PATIENT TYPE:  INP   LOCATION:  5732                                 FACILITY:  [facility]   PHYSICIAN:  Sandria Bales. Ezzard Standing, M.D.               DATE OF BIRTH:  [birth year]   DATE OF PROCEDURE:  10/31/2002  DATE OF DISCHARGE:                                 OPERATIVE REPORT   PREOPERATIVE DIAGNOSIS:  Cholelithiasis with choledocholithiasis, status  post endoscopic retrograde cholangiopancreatography.   POSTOPERATIVE DIAGNOSIS:  Chronic cholecystitis with cholelithiasis and  choledocholithiasis.   PROCEDURE:  Laparoscopic cholecystectomy with intraoperative cholangiogram.   SURGEON:  Sandria Bales. Ezzard Standing, M.D.   FIRST ASSISTANT:  Timothy E. Earlene Plater, M.D.   ANESTHESIA:  General endotracheal.   ESTIMATED BLOOD LOSS:  Minimal.   INDICATION FOR PROCEDURE:  Christian Kim is a 65 year old white male who  presented acutely on October 29, 2002, with abdominal pain, jaundice, felt to  have common duct stones.  He underwent an ERCP by Dr. Claudette Head on October 30, 2002.  He had a sphincterotomy and papillotomy and now comes for  attempted laparoscopic cholecystectomy.   The indications and potential complications have been discussed with the  patient.  Potential complications included but are not limited to infection,  bleeding, bile duct injury, open surgery.   The patient presents to the operating room.  He is already on Unasyn  antibiotic.  He had his abdomen prepped with Betadine solution and sterilely  draped.  An infraumbilical incision made with sharp dissection carried to  the abdominal cavity.  Four total trocars were placed, a Hasson 12 mm trocar  at his umbilicus, a 10 mm Ethicon trocar in a subxiphoid location, and two 5  mm Ethicon trocars in the subcostal location.  Abdominal exploration  revealed some bile-stained peritoneal fluid but otherwise the right and left  lobes of the liver were unremarkable.  The anterior wall of the stomach was  unremarkable.  There was no evidence of any inguinal hernias, and his bowel  was pretty much covered by his omentum.   The gallbladder was grasped, rotated cephalad.  There were noted to be  adhesions at the gallbladder.  These were taken down with Bovie cautery and  then I decompressed the gallbladder using aspirating.   Sharp dissection was again carried down to the gallbladder-cystic duct  junction.  I triply endoclipped the cystic artery.  I placed a clip on the  side of the gallbladder at the cystic duct.  We identified and opened the  triangle of Calot and then shot an intraoperative cholangiogram.  The  intraoperative cholangiogram was shot using a half-strength Hypaque solution  using a total of about 8 mL and fluoroscopy.  This showed free flow of  contrast down the cystic duct, into the common bile duct, into the duodenum.  However, he had about an  8 or 9 mm sort of dumbbell object in the distal  common bile duct that I think was consistent with a retained common bile  duct stone, possibly two.  The contrast refluxed up into the hepatic  radicles.  Because he had had the ERCP, because he had the sphincterotomy  with the assumption this would probably pass, I did no further diagnostic  testing or manipulation of this stone.   I removed the Taut catheter and placed three clips on the side of the cystic  duct and divided the cystic duct.  I then sharply dissected the gallbladder  from the gallbladder bed using primarily hook Bovie coagulation.  Prior to  the complete division of the gallbladder from the gallbladder bed, I  revisualized the gallbladder bed and triangle of Calot.  I saw no bleeding,  no bile leak.  I did place a piece of Surgicel in his gallbladder bed  because there was a little bit of oozing during the procedure and he does  have elevated living functions, so I worried about making him a  little bit  more coagulopathic.  I used about 1.5 L of saline to irrigate.  I then  placed his gallbladder in an EndoCatch bag and delivered it through the  umbilicus.  The umbilical port was closed with a 0 Vicryl suture.  The other  ports were removed in turn.  There was no bleeding at any of the port sites.  The skin at each site was closed with a 5-0 Vicryl suture, painted with  tincture of Benzoin and steri-stripped and sterilely dressed.   The patient tolerated the procedure well, was transported to the recovery  room in good condition.  Sponge and needle count were correct at the end of  the case.                                               Sandria Bales. Ezzard Standing, M.D.    DHN/MEDQ  D:  10/31/2002  T:  11/02/2002  Job:  [id number]   cc:   [name], M.D. [facility]   Malcolm T. Russella Dar, M.D. [facility]

## 2010-10-13 NOTE — H&P (Signed)
NAME:  Christian Kim, Christian Kim NO.:  [id number]   MEDICAL RECORD NO.:  [id number]          PATIENT TYPE:  INP   LOCATION:  0104                         [facility]ACILITY[facility]  [facility]   PHYSICIAN:  Jordan Hawks. Elnoria Howard, MD    DATE OF BIRTH:  [birth year]   DATE OF ADMISSION:  10/28/2004  DATE OF DISCHARGE:                                HISTORY & PHYSICAL   REASON FOR ADMISSION:  Right upper quadrant pain and possible recurrent  choledocholithiasis.   HISTORY OF PRESENT ILLNESS:  This is a 65 year old white male with a past  medical history of hypertension, hyperlipidemia, recurrent  choledocholithiasis status post laparoscopic cholecystectomy in June 2004,  who presents to the emergency room with worsening right upper quadrant pain.  The pain started acutely on Wednesday and progressively worsened. Initially  he thought he thought it was secondary to acid indigestion, but over-the-  counter medications were of no benefit. On the day of admission the patient  states that the pain became constant. The pain has worsened with movement  such as bending over. There is no report of any fever, nausea, vomiting or  jaundice. The pain is sharp and radiates to the right shoulder. He states  that he this not like his prior gallstone pains which manifest as  hypogastric pain. The patient has had four prior ERCPs by Dr. Russella Dar and Dr.  Marina Goodell. The last ERCP was in April 2006 with findings of sludge and small  cholesterol stones. A sphincterotomy was noted to be adequate at that time  and he was subsequently placed on ursodiol 1200 mg daily in hopes of  recurrent stone formation in the common bile duct. Additionally, the patient  denies any trauma to the right upper quadrant region.   PAST MEDICAL/SURGICAL HISTORY:  As stated above.   ALLERGIES:  No known drug allergies.   MEDICATIONS:  1.  Hydrochlorothiazide/lisinopril 12.5/10 mg daily.  2.  Lovastatin 40 mg q.h.s.  3.  Ursodiol 300 mg two tablets  p.o. b.i.d.   SOCIAL HISTORY:  The patient is married. He lives with his wife. He has  occasional alcohol use. No tobacco abuse.   REVIEW OF SYSTEMS:  Negative for any headaches, dysuria, chest pain,  shortness of breath, arthritis, arthralgias, nausea, vomiting, diarrhea, or  constipation.   PHYSICAL EXAMINATION:  Pending at this time.  GENERAL: The patient is mildly uncomfortable, in no acute distress.  HEENT: Normocephalic and atraumatic. Extraocular movements intact. Pupils  equal, round, and reactive to light.  NECK: Supple with no lymphadenopathy.  LUNGS: Clear to auscultation bilaterally.  CARDIOVASCULAR: Regular rate and rhythm.  ABDOMEN: Soft, flat and tender in the right upper quadrant region. No  evidence of any hepatosplenomegaly. No rebound. Positive bowel sounds.  EXTREMITIES: No clubbing, cyanosis, or edema.   LABORATORY DATA:  On October 28, 2004, white blood cell count 11.9, hemoglobin  14.3, platelet count 211,000, MCV 91.3. Sodium 138, potassium 4.0, chloride  103, CO2 28, BUN 23, creatinine 1.4, glucose 113, AST 23, ALT 27, alkaline  phosphatase 98, total bilirubin 2.0.   IMPRESSION:  1.  Right upper  quadrant pain, questionable recurrent choledocholithiasis.  2.  Hypertension.  3.  Hyperlipidemia.   Given the patient's history, this can certainly be a recurrent attack of  stone formation that is common bile duct. The liver panel at this time is  not impressive, there is a mild elevation in his bilirubin at 2.0.  Additionally, the white blood cell count is also  noted to be mildly  elevated. Unfortunately, there are no imaging studies available at this  time. Therefore, a definitive plan of action cannot be determined at this  time.   PLAN:  1.  CT scan of the abdomen and pelvis with IV and p.o. contrast.  2.  Await results of CT scan to determine if an ERCP is needed.  3.  If an ERCP is needed, a biliary stent may be required despite the      sphincterotomy  from prior ERCP in light of the recurrent attacks.  4.  Resume hypertension and hyperlipidemia medications.  5.  Continue ursodiol.  6.  Treat with ciprofloxacin at this time.      PDH/MEDQ  D:  10/29/2004  T:  10/29/2004  Job:  [id number]   cc:   Wilhemina Bonito. Marina Goodell, M.D. [facility]   Malcolm T. Russella Dar, M.D. [facility]

## 2010-10-13 NOTE — Op Note (Signed)
NAME:  Christian Kim, Christian Kim                         ACCOUNT NO.:  [id number]   MEDICAL RECORD NO.:  [id number]                   PATIENT TYPE:  AMB   LOCATION:  ENDO                                 FACILITY:  [facility]   PHYSICIAN:  Wilhemina Bonito. Marina Goodell, M.D. [facility]             DATE OF BIRTH:  07-17-1945   DATE OF PROCEDURE:  12/04/2002  DATE OF DISCHARGE:                                 OPERATIVE REPORT   PROCEDURE:  Endoscopic retrograde cholangiography with biliary stone  extraction.   INDICATION:  Recurrent epigastric pain and abnormal liver function tests  postcholecystectomy.   HISTORY:  This is a 65 year old gentleman, who was admitted to [facility]  in early June with symptomatic choledocholithiasis.  He underwent ERCP with  sphincterotomy and stone extraction.  He subsequently underwent laparoscopic  cholecystectomy with intraoperative cholangiogram.  He was found to have  additional common duct stones which were removed with a subsequent ERCP on  June 8.  Since that time, he has had some intermittent epigastric pain which  was short-lived though severe.  He has had abnormal liver function tests.  He was felt to possibly have additional retained stone.  He is now for ERCP  with possible sphincterotomy extension and common duct stone extraction.  The nature of the procedure as well as the risks, benefits, and alternatives  were reviewed in detail.  He understood and agreed to proceed.   PHYSICAL EXAMINATION:  GENERAL:  Well-appearing male in no acute distress.  He is alert and oriented.  VITAL SIGNS:  Stable.  LUNGS:  Clear.  HEART:  Regular.  ABDOMEN:  Benign.   DESCRIPTION OF PROCEDURE:  After informed consent was obtained, the patient  was sedated with Demerol 90 mg and Versed 8 mg IV.  Glucagon 0.5 mg IV was  given as a duodenal relaxant.  Unasyn 1.5 g IV was given preprocedurally.  The Olympus side-viewing endoscope was then passed blindly into the  esophagus.  The stomach was  unremarkable.  The duodenal bulb revealed mild,  nonerosive duodenitis.  The postbulbar duodenum revealed previous biliary  sphincterotomy with the orifice buried between duodenal folds.  The  sphincterotomy size was deemed moderate.  Selective injection of contrast in  the biliary tree was performed.  The biliary tree was normal  postcholecystectomy with small distal filling defect noted.  The bile duct  itself was approximately 8 mm.  An 8.5 mm balloon was pulled through the  bile duct and inspissated bile and a small cholesterol stone extracted.  The  8 mm balloon was then exchanged for a 12 mm balloon which also passed  through the sphincterotomy site without resistance.  No additional residual  filling defects, though the biliary tree did not drain readily (though it  drained in time).  There was no evidence of obstructing lesion or additional  filling defects.  It was not felt wise to empirically extend the patient's  sphincterotomy given the surrounding anatomy and the fact that a 12 mm  balloon could be pulled through without significant resistance.  The patient  moved to recovery without having experienced problems or difficulties.   IMPRESSION:  Choledocholithiasis, status post ERC with common duct stone  extraction.    RECOMMENDATIONS:  1. Postprocedure observation.  If patient is doing well, then can be     discharged home later today with two day course of ciprofloxacin.  2. I would like to see him back in the office for follow-up in about two     weeks.                                               Wilhemina Bonito. Marina Goodell, M.D. [facility]    JNP/MEDQ  D:  12/04/2002  T:  12/04/2002  Job:  [id number]   cc:   Sandria Bales. Ezzard Standing, M.D.  [address], [address]  [place]  [place] [address]  Fax: [phone]   [name], M.D. [facility]

## 2010-10-13 NOTE — Consult Note (Signed)
NAME:  Christian Kim, Christian Kim                         ACCOUNT NO.:  [id number]   MEDICAL RECORD NO.:  [id number]                   PATIENT TYPE:  INP   LOCATION:  5727                                 FACILITY:  [facility]   PHYSICIAN:  Adolph Pollack, M.D.            DATE OF BIRTH:  Apr 08, 1946   DATE OF CONSULTATION:  10/29/2002  DATE OF DISCHARGE:                                   CONSULTATION   REASON FOR CONSULTATION:  Abdominal pain, gallbladder disease.   HISTORY OF PRESENT ILLNESS:  Mr. Hennes is a 65 year old male who over the  past month has had four separate episodes of epigastric pain radiating  through to the back and then chills, usually post prandially.  Forty-eight  hours ago he had a similar type of attack with severe pain that has not let  up.  He also noticed himself becoming jaundiced and noted dark-colored urine  and light-colored stool.  He went to see Dr. Cato Mulligan who obtained some  laboratory work on him and then sent him over for an ultrasound at [facility] office.  This demonstrated gallbladder sludge and slightly  dilated common bile duct.  The pancreas was obscured and unable to be seen.  He subsequent was admitted to the hospital, started on IV antibiotics, and I  was asked to see him.  He says the pain is not as bad as it was before.  He  denies any nausea.  He has had some chills.  There is no family history of  gallbladder disease.   PAST MEDICAL HISTORY:  Hypertension.   PREVIOUS ABDOMINAL OPERATIONS:  None.   MEDICATIONS:  Lisinopril or equivalent and hydrochlorothiazide.   SOCIAL HISTORY:  No tobacco use.  Occasional alcohol use.  He is married and  here with his wife.   FAMILY HISTORY:  Mother has hypertension.   REVIEW OF SYSTEMS:  CARDIOVASCULAR:  No known heart disease.  PULMONARY:  No  asthma, pneumonia, COPD, or tuberculosis.  GI:  No peptic ulcer disease,  hepatitis, diverticulitis.  GU:  No kidney stones.  Has a small amount of  discomfort when urinating.  This occurred when his urine became dark.  ENDOCRINE:  No diabetes or thyroid disease.  NEUROLOGIC:  No strokes or  seizures.  HEMATOLOGIC:  No known bleeding disorders, blood transfusions, or  deep venous thrombosis.   PHYSICAL EXAMINATION:  GENERAL:  A well-developed, well-nourished male who  appears to be in no acute distress.  VITAL SIGNS:  Temperature 100.7, blood pressure 11/66, pulse 86.  SKIN:  Jaundiced.  HEENT:  Eyes:  Extraocular motions intact, positive scleral icterus.  NECK:  Supple without masses or obvious thyroid enlargement.  RESPIRATORY:  Breath sounds equal and clear.  Respirations are nonlabored.  CARDIOVASCULAR:  Heart demonstrates a regular rate and rhythm, no murmur  heard.  ABDOMEN:  Soft with mild right upper quadrant epigastric tenderness.  No  hernias.  No masses.  No organomegaly.  EXTREMITIES:  Good muscle tone.  No cyanosis or edema.   LABORATORY DATA:  Hemoglobin 14.7 with a white blood cell count of 15,000.  Total bilirubin 6.6, alk phos 214, SGOT 277, SGPT 376, amylase 51.   CT scan of the abdomen and pelvis pending.   IMPRESSION:  Obstructive jaundice most likely secondary to  choledocholithiasis although cannot rule out a mass in the head of the  pancreas.  I also feel he has mild cholangitis but he is not septic at this  time and has been started on IV antibiotics.   PLAN:  I agree with the IV antibiotics and the CT scan to rule out  pancreatic mass.  I would recommend obtaining a GI consultation for possible  ERCP and I have actually discussed this with Dr. Juanda Chance.  Will follow him  with you.                                               Adolph Pollack, M.D.    Kari Baars  D:  10/29/2002  T:  10/30/2002  Job:  [id number]   cc:   [name], M.D. [facility]   Lina Sar, M.D. [facility]

## 2010-10-13 NOTE — Op Note (Signed)
NAME:  Christian Kim, Christian Kim NO.:  [id number]   MEDICAL RECORD NO.:  [id number]          PATIENT TYPE:  AMB   LOCATION:  ENDO                         FACILITY:  [facility]   PHYSICIAN:  Wilhemina Bonito. Marina Goodell, M.D. [facility] OF BIRTH:  1946-05-22   DATE OF PROCEDURE:  11/01/2004  DATE OF DISCHARGE:                                 OPERATIVE REPORT   PROCEDURE:  Endoscopic retrograde cholangiography with extension of prior  biliary sphincterotomy and common bile duct stone fragment extraction.   INDICATIONS:  Abdominal pain and elevated liver function tests.   HISTORY:  This is a 65 year old gentleman with recurrent choledocholithiasis  who underwent ERCP with stone extraction in the latter part of April. He did  well until this past weekend when he developed right upper quadrant and  right shoulder pain. He had mildly elevated bilirubin. Pain worsened over  time and was only relieved with pain medications. Liver tests worsened, and  the patient developed jaundiced. No evidence of cholangitis. He was seen in  the office two days ago. He was given Percocet and Phenergan for pain and  empirically started on oral ciprofloxacin. He presents at this time for  repeat ERCP. The nature of the procedure as well as the risks, benefits, and  alternatives were again carefully reviewed. He understood and agreed to  proceed.   PHYSICAL EXAMINATION:  GENERAL:  Physical exam finds a somewhat more  comfortable appearing male in no acute distress. He is alert and oriented.  VITAL SIGNS:  Stable.  HEENT:  Sclerae are less icteric.  LUNGS:  Clear.  HEART:  Regular.  ABDOMEN:  Soft with no appreciable right upper quadrant tenderness.   PROCEDURE:  After informed consent was obtained, the patient was sedated  with 70 mg of Demerol and 9 mg of Versed IV. Ciprofloxacin 400 mg IV was  given preprocedure. Glucagon 1.0 mg IV was given as a duodenal relaxant. The  Olympus side-viewing endoscope was then  passed blindly into the esophagus.  Stomach was normal. The duodenal bulb and postbulbar duodenum revealed  nonerosive duodenitis. The minor ampulla was not sought. The major ampulla  was again buried amidst multiple redundant folds. No attempt at pancreatic  duct cannulation was made. The common bile duct was selectively and deeply  cannulated. Injection of contrast allows complete filling of the biliary  tree. No large filling defects noted; however, several tiny stone fragments  were seen falling into the duodenum with catheter manipulation. The biliary  system was mildly dilated. Again, poor drainage of the system apparent.  Careful inspection of the ampulla and prior sphincterotomy determined that  the initial cut at this point could be extended in hopes of improving  drainage. As such, a hydrophilic guidewire was placed into the proximal  biliary tree, and the biliary sphincterotomy was extended with cutting of  the 12 o'clock orientation via the ERBE system. Prior to sphincterotomy  extension, contrast readily drained from the biliary tree. Subsequently, a  sequential balloon was used with 12- and 8.5-mm diameters. Multiple balloon  sweeps of the duct were performed with only tiny stone  fragments noted. Post  extraction occlusion cholangiogram was negative with significantly improved  drainage noted.   IMPRESSION:  Choledocholithiasis status post endoscopic retrograde  cholangiopancreatography with sphincterotomy extension and common duct  fragment extraction.   RECOMMENDATIONS:  1.  Continue ciprofloxacin for 5 days.  2.  Continue previously initiated ursodiol.  3.  Office followup next week. The patient may be discharged home later      today assuming no significant post procedure complaints or problems. I      discussed this with his wife.      JNP/MEDQ  D:  11/01/2004  T:  11/01/2004  Job:  [id number]   cc:   [name], M.D. [facility]

## 2010-10-16 ENCOUNTER — Other Ambulatory Visit: Payer: Self-pay

## 2010-10-16 ENCOUNTER — Other Ambulatory Visit: Payer: Self-pay | Admitting: Internal Medicine

## 2010-10-16 DIAGNOSIS — T887XXA Unspecified adverse effect of drug or medicament, initial encounter: Secondary | ICD-10-CM

## 2010-10-16 DIAGNOSIS — E785 Hyperlipidemia, unspecified: Secondary | ICD-10-CM

## 2010-10-25 ENCOUNTER — Ambulatory Visit: Payer: Self-pay | Admitting: Internal Medicine

## 2010-11-04 ENCOUNTER — Other Ambulatory Visit: Payer: Self-pay | Admitting: Internal Medicine

## 2010-12-29 ENCOUNTER — Other Ambulatory Visit: Payer: Self-pay | Admitting: Internal Medicine

## 2010-12-29 ENCOUNTER — Other Ambulatory Visit (INDEPENDENT_AMBULATORY_CARE_PROVIDER_SITE_OTHER): Payer: BC Managed Care – PPO

## 2010-12-29 DIAGNOSIS — T887XXA Unspecified adverse effect of drug or medicament, initial encounter: Secondary | ICD-10-CM

## 2010-12-29 DIAGNOSIS — E785 Hyperlipidemia, unspecified: Secondary | ICD-10-CM

## 2010-12-29 LAB — HEPATIC FUNCTION PANEL
Albumin: 4.5 g/dL (ref 3.5–5.2)
Total Protein: 7.3 g/dL (ref 6.0–8.3)

## 2010-12-29 LAB — BASIC METABOLIC PANEL
CO2: 30 mEq/L (ref 19–32)
Chloride: 104 mEq/L (ref 96–112)
Creatinine, Ser: 1.6 mg/dL — ABNORMAL HIGH (ref 0.4–1.5)
Potassium: 3.7 mEq/L (ref 3.5–5.1)
Sodium: 143 mEq/L (ref 135–145)

## 2010-12-29 LAB — LIPID PANEL
Cholesterol: 119 mg/dL (ref 0–200)
HDL: 33 mg/dL — ABNORMAL LOW (ref >39.00)
Total CHOL/HDL Ratio: 4
Triglycerides: 327 mg/dL — ABNORMAL HIGH (ref 0.0–149.0)
VLDL: 65.4 mg/dL — ABNORMAL HIGH (ref 0.0–40.0)

## 2010-12-29 LAB — LDL CHOLESTEROL, DIRECT: Direct LDL: 45.1 mg/dL

## 2011-01-05 ENCOUNTER — Ambulatory Visit: Payer: Self-pay | Admitting: Internal Medicine

## 2011-01-09 ENCOUNTER — Ambulatory Visit (INDEPENDENT_AMBULATORY_CARE_PROVIDER_SITE_OTHER): Payer: BC Managed Care – PPO | Admitting: Internal Medicine

## 2011-01-09 ENCOUNTER — Encounter: Payer: Self-pay | Admitting: Internal Medicine

## 2011-01-09 DIAGNOSIS — N289 Disorder of kidney and ureter, unspecified: Secondary | ICD-10-CM

## 2011-01-09 DIAGNOSIS — I10 Essential (primary) hypertension: Secondary | ICD-10-CM

## 2011-01-09 DIAGNOSIS — I2510 Atherosclerotic heart disease of native coronary artery without angina pectoris: Secondary | ICD-10-CM

## 2011-01-09 DIAGNOSIS — Z2911 Encounter for prophylactic immunotherapy for respiratory syncytial virus (RSV): Secondary | ICD-10-CM

## 2011-01-09 DIAGNOSIS — Z23 Encounter for immunization: Secondary | ICD-10-CM

## 2011-01-09 DIAGNOSIS — M25561 Pain in right knee: Secondary | ICD-10-CM

## 2011-01-09 DIAGNOSIS — M25569 Pain in unspecified knee: Secondary | ICD-10-CM

## 2011-01-09 DIAGNOSIS — E785 Hyperlipidemia, unspecified: Secondary | ICD-10-CM

## 2011-01-09 MED ORDER — OMEPRAZOLE MAGNESIUM 20 MG PO TBEC: 20.0000 mg | DELAYED_RELEASE_TABLET | Freq: Every day | ORAL | Status: DC

## 2011-01-09 NOTE — Assessment & Plan Note (Signed)
No sxs Will continue same meds

## 2011-01-09 NOTE — Assessment & Plan Note (Signed)
Well controlled. Continue current meds

## 2011-01-09 NOTE — Assessment & Plan Note (Signed)
crt stable Will continue to follow in 6 months

## 2011-01-09 NOTE — Assessment & Plan Note (Signed)
Unclear etiology Will check xray

## 2011-01-09 NOTE — Assessment & Plan Note (Signed)
Well controlled Continue same meds 

## 2011-01-09 NOTE — Progress Notes (Signed)
  Subjective:    Patient ID: Christian Kim, male    DOB: [birth year], 65 y.o.   MRN: [id number]  HPI  Patient Active Problem List  Diagnoses  . HYPERLIPIDEMIA---tolerating meds  . HYPERTENSION--no sxs on current meds  . CORONARY ARTERY DISEASE---no CP, SOB, pnd or any other sxs  . CHOLEDOCHOLITHIASIS---rare sxs ---has seen dr Marina Goodell.   [name] COMMON BILE DUCT OBSTRUCTION, HX OF  . Renal insufficiency---needs regular f/u   Past Medical History  Diagnosis Date  . Hyperlipidemia   . Hypertension   . CAD (coronary artery disease)   . Choledocholithiasis     recurrent/multiple ERCPs withi stone extraction and sphincterotomy    Past Surgical History  Procedure Date  . Cholecystectomy   . Nasal septum surgery   . Coronary angioplasty with stent placement     reports that he has never smoked. He does not have any smokeless tobacco history on file. He reports that he drinks alcohol. He reports that he does not use illicit drugs. family history includes Liver cancer in his mother and Lung cancer in his mother.  There is no history of Colon cancer. No Known Allergies   Review of Systems  patient denies chest pain, shortness of breath, orthopnea. Denies lower extremity edema, abdominal pain, change in appetite, change in bowel movements. Patient denies rashes, musculoskeletal complaints (except he notes bilateral knee pain at night-typically when he tries to straighten his legs---no pain with walking). No other specific complaints in a complete review of systems.      Objective:   Physical Exam   well-developed well-nourished male in no acute distress. HEENT exam atraumatic, normocephalic, neck supple without jugular venous distention. Chest clear to auscultation cardiac exam S1-S2 are regular. Abdominal exam overweight with bowel sounds, soft and nontender. Extremities no edema. Neurologic exam is alert with a normal gait.       Assessment & Plan:

## 2011-01-11 ENCOUNTER — Telehealth: Payer: Self-pay | Admitting: *Deleted

## 2011-01-11 MED ORDER — CEPHALEXIN 500 MG PO CAPS: 500.0000 mg | ORAL_CAPSULE | Freq: Three times a day (TID) | ORAL | Status: AC

## 2011-01-11 NOTE — Telephone Encounter (Signed)
Per Dr. Cato Mulligan:  Advil 600 mg. One po tid. Keflex 500 mg one tid x 7 days Benadryl prn for itching if needed. Notified pt. Of Dr. Cato Mulligan recommendations, and advised pt to call back if worsening of symptoms or no improvement.

## 2011-01-11 NOTE — Telephone Encounter (Signed)
Pt is calling stating since getting his Varicella Zoster and Pneumonia vaccine, he has fever, chills, and his site of injection is so painful, he can barely move arm.  He is too sick to work, and has now broken out in hives, and is itching all over.

## 2011-01-17 ENCOUNTER — Other Ambulatory Visit: Payer: Self-pay | Admitting: Internal Medicine

## 2011-02-01 ENCOUNTER — Other Ambulatory Visit: Payer: Self-pay | Admitting: Internal Medicine

## 2011-02-01 NOTE — Telephone Encounter (Signed)
Dr. Swords pt 

## 2011-03-09 LAB — CBC
HCT: 36.7 — ABNORMAL LOW
Hemoglobin: 12.8 — ABNORMAL LOW
MCHC: 35
Platelets: 178
Platelets: 197
RDW: 12.4
RDW: 12.9
WBC: 12 — ABNORMAL HIGH

## 2011-03-09 LAB — BASIC METABOLIC PANEL
BUN: 18
Calcium: 8.9
GFR calc non Af Amer: >60
Glucose, Bld: 126 — ABNORMAL HIGH
Potassium: 4
Sodium: 140

## 2011-03-09 LAB — CARDIAC PANEL(CRET KIN+CKTOT+MB+TROPI)
CK, MB: 4.9 — ABNORMAL HIGH
Relative Index: 3.6 — ABNORMAL HIGH
Troponin I: 8.08

## 2011-03-12 LAB — I-STAT 8, (EC8 V) (CONVERTED LAB)
Acid-Base Excess: 3 — ABNORMAL HIGH
Bicarbonate: 27.2 — ABNORMAL HIGH
HCT: 45
Hemoglobin: 15.3
Operator id: 270111
Sodium: 139
TCO2: 28
pCO2, Ven: 40.7 — ABNORMAL LOW

## 2011-03-12 LAB — COMPREHENSIVE METABOLIC PANEL
ALT: 26
AST: 45 — ABNORMAL HIGH
Alkaline Phosphatase: 82
CO2: 24
Chloride: 111
GFR calc non Af Amer: 55 — ABNORMAL LOW
Glucose, Bld: 139 — ABNORMAL HIGH
Potassium: 3.9
Sodium: 142

## 2011-03-12 LAB — DIFFERENTIAL
Eosinophils Relative: 2
Lymphocytes Relative: 24
Lymphs Abs: 1.7
Neutro Abs: 4.6

## 2011-03-12 LAB — CARDIAC PANEL(CRET KIN+CKTOT+MB+TROPI)
CK, MB: 54.9 — ABNORMAL HIGH
Relative Index: 10.3 — ABNORMAL HIGH
Relative Index: 12.4 — ABNORMAL HIGH
Relative Index: 13.9 — ABNORMAL HIGH
Relative Index: 6.4 — ABNORMAL HIGH
Total CK: 531 — ABNORMAL HIGH
Total CK: 720 — ABNORMAL HIGH
Troponin I: 10.51
Troponin I: 13.95
Troponin I: 14.5
Troponin I: 21.63

## 2011-03-12 LAB — CBC
HCT: 43.7
MCHC: 35.6
MCV: 92.8
Platelets: 175
Platelets: 232
RBC: 3.73 — ABNORMAL LOW
RBC: 3.84 — ABNORMAL LOW
WBC: 7.3
WBC: 9.3

## 2011-03-12 LAB — LIPID PANEL
Cholesterol: 111
Cholesterol: 127
HDL: 29 — ABNORMAL LOW
HDL: 31 — ABNORMAL LOW
LDL Cholesterol: 46
Total CHOL/HDL Ratio: 3.8
Triglycerides: 124

## 2011-03-12 LAB — BASIC METABOLIC PANEL
CO2: 23
Chloride: 106
Chloride: 109
Creatinine, Ser: 1.27
GFR calc Af Amer: >60
GFR calc Af Amer: >60
GFR calc non Af Amer: 55 — ABNORMAL LOW
Potassium: 3.6
Potassium: 3.7
Sodium: 140

## 2011-03-12 LAB — TSH: TSH: 2.424

## 2011-03-12 LAB — APTT: aPTT: 37

## 2011-03-12 LAB — HEPARIN INDUCED PLATELET AGGREGATION (CONVERTED LAB)
Heparin 0.1 Donor: 1
Heparin 1 Patient: 1
Heparin 100 Donor: 3

## 2011-03-12 LAB — CK TOTAL AND CKMB (NOT AT ARMC)
CK, MB: 178.3 — ABNORMAL HIGH
Relative Index: 14.1 — ABNORMAL HIGH
Total CK: 958 — ABNORMAL HIGH

## 2011-03-12 LAB — POCT CARDIAC MARKERS
CKMB, poc: 29.7
Myoglobin, poc: >500
Operator id: 198171
Operator id: 270111
Troponin i, poc: 0.84

## 2011-03-12 LAB — HEPARIN ANTIBODY SCREEN: Heparin Antibody Screen: POSITIVE

## 2011-03-12 LAB — POCT I-STAT CREATININE: Creatinine, Ser: 1.5

## 2011-03-12 LAB — HEPARIN LEVEL (UNFRACTIONATED): Heparin Unfractionated: <0.1 — ABNORMAL LOW

## 2011-03-12 LAB — PLATELET COUNT: Platelets: 178

## 2011-03-12 LAB — TROPONIN I
Troponin I: 25.82
Troponin I: 4.06

## 2011-03-12 LAB — HEMOGLOBIN A1C: Hgb A1c MFr Bld: 5.3

## 2011-03-12 LAB — D-DIMER, QUANTITATIVE: D-Dimer, Quant: 0.62 — ABNORMAL HIGH

## 2011-04-30 ENCOUNTER — Other Ambulatory Visit: Payer: Self-pay | Admitting: Cardiology

## 2011-05-03 ENCOUNTER — Ambulatory Visit: Payer: Medicare Other | Admitting: Cardiology

## 2011-05-15 ENCOUNTER — Ambulatory Visit (INDEPENDENT_AMBULATORY_CARE_PROVIDER_SITE_OTHER): Payer: Medicare Other | Admitting: Cardiology

## 2011-05-15 ENCOUNTER — Encounter: Payer: Self-pay | Admitting: Cardiology

## 2011-05-15 VITALS — BP 128/72 | HR 58 | Ht 71.0 in | Wt 190.8 lb

## 2011-05-15 DIAGNOSIS — I2510 Atherosclerotic heart disease of native coronary artery without angina pectoris: Secondary | ICD-10-CM

## 2011-05-15 NOTE — Patient Instructions (Signed)
Your physician wants you to follow-up in: 1 YEAR.  You will receive a reminder letter in the mail two months in advance. If you don't receive a letter, please call our office to schedule the follow-up appointment.  Your physician recommends that you continue on your current medications as directed. Please refer to the Current Medication list given to you today.  

## 2011-05-19 ENCOUNTER — Other Ambulatory Visit: Payer: Self-pay | Admitting: Internal Medicine

## 2011-05-23 NOTE — Progress Notes (Signed)
   HPI:  He is doing fine.  No new symptoms.  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  It really is quiet from an acitivity standpoint, something that was required however in his return to work notes.    Current Outpatient Prescriptions  Medication Sig Dispense Refill  . aspirin 81 MG tablet Take 81 mg by mouth daily.        [name] atorvastatin (LIPITOR) 20 MG tablet take 1 tablet by mouth once daily  90 tablet  3  . chlorthalidone (HYGROTON) 25 MG tablet take 1 tablet by mouth once daily  90 tablet  1  . lisinopril (PRINIVIL,ZESTRIL) 40 MG tablet take 1 tablet by mouth once daily  90 tablet  1  . METOPROLOL SUCCINATE PO Take 50 mg by mouth daily.        [name] omeprazole (PRILOSEC OTC) 20 MG tablet Take 1 tablet (20 mg total) by mouth daily.  30 tablet  5  . PLAVIX 75 MG tablet TAKE 1 TABLET BY MOUTH ONCE DAILY  90 tablet  2  . ursodiol (ACTIGALL) 300 MG capsule Take 300 mg by mouth. 2 tablets twice daily       . metoprolol (TOPROL-XL) 50 MG 24 hr tablet take 1 tablet by mouth once daily  30 tablet  5    No Known Allergies  Past Medical History  Diagnosis Date  . Hyperlipidemia   . Hypertension   . CAD (coronary artery disease)   . Choledocholithiasis     recurrent/multiple ERCPs withi stone extraction and sphincterotomy     Past Surgical History  Procedure Date  . Cholecystectomy   . Nasal septum surgery   . Coronary angioplasty with stent placement     Family History  Problem Relation Age of Onset  . Liver cancer Mother     unknown primary origin  . Lung cancer Mother   . Colon cancer Neg Hx     History   Social History  . Marital Status: Married    Spouse Name: N/A    Number of Children: N/A  . Years of Education: N/A   Occupational History  . retired TEFL teacher   . [employer]     works in Office manager now   Social History Main Topics  . Smoking status: Never Smoker   . Smokeless tobacco: Not on  file  . Alcohol Use: 0.0 oz/week     rarely drinks  . Drug Use: No  . Sexually Active: Not on file   Other Topics Concern  . Not on file   Social History Narrative  . No narrative on file    ROS: Please see the HPI.  All other systems reviewed and negative.  PHYSICAL EXAM:  BP 128/72  Pulse 58  Ht 5\' 11"  (1.803 m)  Wt 86.546 kg (190 lb 12.8 oz)  BMI 26.61 kg/m2  General: Well developed, well nourished, in no acute distress. Head:  Normocephalic and atraumatic. Neck: no JVD Lungs: Clear to auscultation and percussion. Heart: Normal S1 and S2.  No murmur, rubs or gallops.  Abdomen:  Normal bowel sounds; soft; non tender; no organomegaly Pulses: Pulses normal in all 4 extremities. Extremities: No clubbing or cyanosis. No edema. Neurologic: Alert and oriented x 3.  EKG:  SB.  Cannot exclude old inferior MI.  Nonspecific T flattening.   ASSESSMENT AND PLAN:

## 2011-05-27 NOTE — Assessment & Plan Note (Signed)
He continues to remain stable.  He has three DES platforms, with overlapping stents.  However, he has second generation stents.  He remains on DAPT, and this is a point of discussion regarding guidelines versus his own circumstances.  Will continue to observe clinical trial data for clues.

## 2011-05-29 DIAGNOSIS — M109 Gout, unspecified: Secondary | ICD-10-CM

## 2011-05-29 HISTORY — DX: Gout, unspecified: M10.9

## 2011-06-01 ENCOUNTER — Encounter (INDEPENDENT_AMBULATORY_CARE_PROVIDER_SITE_OTHER): Payer: Self-pay | Admitting: Family Medicine

## 2011-06-08 ENCOUNTER — Other Ambulatory Visit: Payer: Self-pay | Admitting: Cardiology

## 2011-07-04 ENCOUNTER — Other Ambulatory Visit (INDEPENDENT_AMBULATORY_CARE_PROVIDER_SITE_OTHER): Payer: Medicare Other

## 2011-07-04 DIAGNOSIS — N289 Disorder of kidney and ureter, unspecified: Secondary | ICD-10-CM

## 2011-07-04 LAB — LIPID PANEL
Cholesterol: 137 mg/dL (ref 0–200)
Total CHOL/HDL Ratio: 4

## 2011-07-04 LAB — HEPATIC FUNCTION PANEL
AST: 16 U/L (ref 0–37)
Albumin: 4.2 g/dL (ref 3.5–5.2)
Alkaline Phosphatase: 122 U/L — ABNORMAL HIGH (ref 39–117)
Bilirubin, Direct: 0.2 mg/dL (ref 0.0–0.3)
Total Bilirubin: 1 mg/dL (ref 0.3–1.2)

## 2011-07-04 LAB — BASIC METABOLIC PANEL
BUN: 28 mg/dL — ABNORMAL HIGH (ref 6–23)
Chloride: 103 mEq/L (ref 96–112)
Creatinine, Ser: 1.5 mg/dL (ref 0.4–1.5)
Glucose, Bld: 98 mg/dL (ref 70–99)

## 2011-07-05 ENCOUNTER — Other Ambulatory Visit: Payer: Medicare Other

## 2011-07-12 ENCOUNTER — Ambulatory Visit: Payer: Medicare Other | Admitting: Internal Medicine

## 2011-07-16 ENCOUNTER — Other Ambulatory Visit: Payer: Self-pay | Admitting: Internal Medicine

## 2011-07-17 ENCOUNTER — Ambulatory Visit: Payer: Medicare Other | Admitting: Internal Medicine

## 2011-07-18 ENCOUNTER — Encounter: Payer: Self-pay | Admitting: Internal Medicine

## 2011-07-18 ENCOUNTER — Other Ambulatory Visit: Payer: Self-pay | Admitting: Internal Medicine

## 2011-07-18 ENCOUNTER — Ambulatory Visit (INDEPENDENT_AMBULATORY_CARE_PROVIDER_SITE_OTHER): Payer: Medicare Other | Admitting: Internal Medicine

## 2011-07-18 DIAGNOSIS — E785 Hyperlipidemia, unspecified: Secondary | ICD-10-CM

## 2011-07-18 DIAGNOSIS — N289 Disorder of kidney and ureter, unspecified: Secondary | ICD-10-CM

## 2011-07-18 DIAGNOSIS — M25569 Pain in unspecified knee: Secondary | ICD-10-CM

## 2011-07-18 DIAGNOSIS — M109 Gout, unspecified: Secondary | ICD-10-CM | POA: Insufficient documentation

## 2011-07-18 DIAGNOSIS — I2510 Atherosclerotic heart disease of native coronary artery without angina pectoris: Secondary | ICD-10-CM

## 2011-07-18 DIAGNOSIS — M25561 Pain in right knee: Secondary | ICD-10-CM

## 2011-07-18 NOTE — Assessment & Plan Note (Signed)
Has some chronic knee pain if he "walks too far".

## 2011-07-18 NOTE — Assessment & Plan Note (Signed)
Lab Results  Component Value Date   CREATININE 1.5 07/04/2011   Labs are ok Continue same meds

## 2011-07-18 NOTE — Assessment & Plan Note (Signed)
No sxs, continue risk factor modification 

## 2011-07-18 NOTE — Assessment & Plan Note (Signed)
Based on his history I think he has gout. We'll discontinue chlorthalidone. I've advised him against taking any nostril anti-inflammatory drugs given his renal insufficiency.

## 2011-07-18 NOTE — Patient Instructions (Signed)
Discontinue chlorthalidone.    

## 2011-07-18 NOTE — Assessment & Plan Note (Signed)
Adequate control Continue same meds 

## 2011-07-18 NOTE — Progress Notes (Signed)
Patient ID: Christian Kim, male   DOB: 02/09/1946, 66 y.o.   MRN: [id number] Patient Active Problem List  Diagnoses  . HYPERLIPIDEMIA---tolerating meds  . HYPERTENSION--no home BPs  . CORONARY ARTERY DISEASE---no chest pain, COB, PND  . CHOLEDOCHOLITHIASIS---tolerating meds, has regular f/u with GI  .   [name] Renal insufficiency---here for f/u  He describes left-sided foot pain. He had acute swelling erythema of the lateral left foot. This is resolved after ibuprofen.   Past Medical History  Diagnosis Date  . Hyperlipidemia   . Hypertension   . CAD (coronary artery disease)   . Choledocholithiasis     recurrent/multiple ERCPs withi stone extraction and sphincterotomy     History   Social History  . Marital Status: Married    Spouse Name: N/A    Number of Children: N/A  . Years of Education: N/A   Occupational History  . retired TEFL teacher   . [employer]     works in Office manager now   Social History Main Topics  . Smoking status: Never Smoker   . Smokeless tobacco: Not on file  . Alcohol Use: 0.0 oz/week     rarely drinks  . Drug Use: No  . Sexually Active: Not on file   Other Topics Concern  . Not on file   Social History Narrative  . No narrative on file    Past Surgical History  Procedure Date  . Cholecystectomy   . Nasal septum surgery   . Coronary angioplasty with stent placement     Family History  Problem Relation Age of Onset  . Liver cancer Mother     unknown primary origin  . Lung cancer Mother   . Colon cancer Neg Hx     No Known Allergies  Current Outpatient Prescriptions on File Prior to Visit  Medication Sig Dispense Refill  . aspirin 81 MG tablet Take 81 mg by mouth daily.        [name] atorvastatin (LIPITOR) 20 MG tablet take 1 tablet by mouth once daily  90 tablet  3  . chlorthalidone (HYGROTON) 25 MG tablet take 1 tablet by mouth once daily  90 tablet  1  . clopidogrel (PLAVIX) 75 MG tablet take 1 tablet by mouth once daily   90 tablet  2  . lisinopril (PRINIVIL,ZESTRIL) 40 MG tablet take 1 tablet by mouth once daily  90 tablet  1  . metoprolol (TOPROL-XL) 50 MG 24 hr tablet take 1 tablet by mouth once daily  30 tablet  5  . METOPROLOL SUCCINATE PO Take 50 mg by mouth daily.        [name] PRILOSEC OTC 20 MG tablet take 1 tablet by mouth daily  28 tablet  5  . ursodiol (ACTIGALL) 300 MG capsule Take 300 mg by mouth. 2 tablets twice daily          patient denies chest pain, shortness of breath, orthopnea. Denies lower extremity edema, abdominal pain, change in appetite, change in bowel movements. Patient denies rashes, musculoskeletal complaints. No other specific complaints in a complete review of systems.   BP 112/68  Pulse 57  Temp(Src) 97.9 F (36.6 C) (Oral)  Wt 190 lb (86.183 kg)  SpO2 98%  well-developed well-nourished male in no acute distress. HEENT exam atraumatic, normocephalic, neck supple without jugular venous distention. Chest clear to auscultation cardiac exam S1-S2 are regular. Abdominal exam overweight with bowel sounds, soft and nontender. Extremities no edema. Neurologic exam is alert with a  normal gait.

## 2011-07-20 ENCOUNTER — Ambulatory Visit (INDEPENDENT_AMBULATORY_CARE_PROVIDER_SITE_OTHER)
Admission: RE | Admit: 2011-07-20 | Discharge: 2011-07-20 | Disposition: A | Discharge status: Home / Self Care | Payer: Medicare Other | Source: Ambulatory Visit | Attending: Internal Medicine | Admitting: Internal Medicine

## 2011-07-20 DIAGNOSIS — M25561 Pain in right knee: Secondary | ICD-10-CM

## 2011-07-20 DIAGNOSIS — M25569 Pain in unspecified knee: Secondary | ICD-10-CM

## 2011-07-27 ENCOUNTER — Other Ambulatory Visit: Payer: Self-pay | Admitting: Internal Medicine

## 2011-07-27 ENCOUNTER — Telehealth: Payer: Self-pay | Admitting: *Deleted

## 2011-07-27 MED ORDER — PREDNISONE 10 MG PO TABS: ORAL_TABLET | ORAL | Status: DC

## 2011-07-27 MED ORDER — ALLOPURINOL 300 MG PO TABS: 300.0000 mg | ORAL_TABLET | Freq: Every day | ORAL | Status: DC

## 2011-07-27 NOTE — Telephone Encounter (Signed)
Pts left foot is so swollen and painful he can't get shoe on.  Looked at Dr Cato Mulligan note and told pt that Dr Cato Mulligan thought he had gout but needed to check with him cause of pts renal insufficiency. [facility].  Per Dr Cato Mulligan he will send it in Allopurinol and Prednsione.  Pt aware just need the directions to meds to send in electronically

## 2011-07-27 NOTE — Telephone Encounter (Signed)
Please call [facility] at [phone] re: Prednisone prescription directions.

## 2011-07-27 NOTE — Telephone Encounter (Signed)
See meds 

## 2011-07-27 NOTE — Telephone Encounter (Signed)
Pt aware rx sent in electronically

## 2011-07-30 ENCOUNTER — Telehealth: Payer: Self-pay

## 2011-07-30 NOTE — Telephone Encounter (Signed)
[facility] Triage Call Report Triage Record Num: [id number] Operator: Ether Griffins Patient Name: Christian Kim Call Date & Time: 07/27/2011 6:17:44PM Patient Phone: [phone] PCP: [name] Patient Gender: Male PCP Fax : [phone] Patient DOB: 08-09-1945 Practice Name: [facility] Reason for Call: Caller: Jill Side; PCP: Birdie Sons; CB#: [phone]; Call regarding Medication Issue; Medication(s): Prednisone--needs clarification on script directions.Contacted Dr.Swords--script should read: Prednisone 10 mg tabs, 4 tabs x 3 days,2 tabs x 3 days, 1 tab x 3 days, 1/2 tab x 3 days. Protocol(s) Used: Office Note Recommended Outcome per Protocol: Information Noted and Sent to Office Reason for Outcome: Caller information to office Care Advice: ~

## 2011-08-17 ENCOUNTER — Encounter: Payer: Self-pay | Admitting: Family Medicine

## 2011-08-17 ENCOUNTER — Ambulatory Visit (INDEPENDENT_AMBULATORY_CARE_PROVIDER_SITE_OTHER): Payer: Medicare Other | Admitting: Family Medicine

## 2011-08-17 VITALS — BP 140/100 | Temp 99.2°F | Wt 192.0 lb

## 2011-08-17 DIAGNOSIS — L03119 Cellulitis of unspecified part of limb: Secondary | ICD-10-CM

## 2011-08-17 DIAGNOSIS — M25539 Pain in unspecified wrist: Secondary | ICD-10-CM

## 2011-08-17 LAB — CBC WITH DIFFERENTIAL/PLATELET
Basophils Absolute: 0 10*3/uL (ref 0.0–0.1)
Basophils Relative: 0.2 % (ref 0.0–3.0)
Eosinophils Absolute: 0.1 10*3/uL (ref 0.0–0.7)
HCT: 39.9 % (ref 39.0–52.0)
Hemoglobin: 13.8 g/dL (ref 13.0–17.0)
Lymphs Abs: 1.1 10*3/uL (ref 0.7–4.0)
MCHC: 34.6 g/dL (ref 30.0–36.0)
MCV: 91.9 fl (ref 78.0–100.0)
Monocytes Absolute: 0.9 10*3/uL (ref 0.1–1.0)
Neutro Abs: 5.2 10*3/uL (ref 1.4–7.7)
RDW: 14.3 % (ref 11.5–14.6)

## 2011-08-17 MED ORDER — CEPHALEXIN 500 MG PO CAPS: 500.0000 mg | ORAL_CAPSULE | Freq: Three times a day (TID) | ORAL | Status: AC

## 2011-08-17 NOTE — Patient Instructions (Signed)
Elevate hand and arm frequently. Follow up promptly for any vomiting, worsening redness, increasing fever, or any other problems.

## 2011-08-17 NOTE — Progress Notes (Signed)
  Subjective:    Patient ID: Christian Kim, male    DOB: 1945-06-13, 66 y.o.   MRN: [id number]  HPI  Patient seen with couple day history of progressive right wrist pain. Noted swelling, redness, and warmth. No injury. Questionable history of gout-not confirmed with crystal analysis. Recently chlorthalidone discontinued and started on allopurinol. He's never had wrist problems before. Possible low-grade fever yesterday. Had mild nausea yesterday without vomiting but none today. Pain is throbbing and severe with dependency. No known drug allergies. No  Alleviating factors.  Pain is progressive. Moderated severity.  Reviewed:  Past Medical History  Diagnosis Date  . Hyperlipidemia   . Hypertension   . CAD (coronary artery disease)   . Choledocholithiasis     recurrent/multiple ERCPs withi stone extraction and sphincterotomy    Past Surgical History  Procedure Date  . Cholecystectomy   . Nasal septum surgery   . Coronary angioplasty with stent placement     reports that he has never smoked. He does not have any smokeless tobacco history on file. He reports that he drinks alcohol. He reports that he does not use illicit drugs. family history includes Liver cancer in his mother and Lung cancer in his mother.  There is no history of Colon cancer. No Known Allergies    Review of Systems  Constitutional: Positive for fever. Negative for chills.  Respiratory: Negative for cough and shortness of breath.   Cardiovascular: Negative for chest pain.  Skin: Positive for rash.  Neurological: Negative for dizziness and headaches.  Hematological: Negative for adenopathy.       Objective:   Physical Exam  Constitutional: He is oriented to person, place, and time. He appears well-developed and well-nourished.  Neck: Neck supple.  Cardiovascular: Normal rate and regular rhythm.   Pulmonary/Chest: Effort normal and breath sounds normal. No respiratory distress. He has no wheezes. He has no  rales.  Musculoskeletal: Normal range of motion. He exhibits tenderness.       Right wrist reveals edema and warmth. He has erythema involving much of the volar and dorsal aspect of the right wrist. Tender to palpation. He has a couple of red streaks extending along the volar surface of the forearm toward the elbow.  Lymphadenopathy:    He has no cervical adenopathy.  Neurological: He is alert and oriented to person, place, and time.          Assessment & Plan:  Right wrist pain with erythema, warmth, and edema. No injury. Strongly suspect cellulitis based on possible low-grade fever yesterday and erythematous streaks. Less likely gout.  Start Keflex 500 mg 3 times a day and elevate arm frequently. Check CBC and uric acid. Follow up promptly for any fever worsening symptoms

## 2011-08-20 ENCOUNTER — Encounter: Payer: Self-pay | Admitting: Family Medicine

## 2011-08-20 ENCOUNTER — Ambulatory Visit (INDEPENDENT_AMBULATORY_CARE_PROVIDER_SITE_OTHER): Payer: Medicare Other | Admitting: Family Medicine

## 2011-08-20 ENCOUNTER — Telehealth: Payer: Self-pay | Admitting: *Deleted

## 2011-08-20 VITALS — BP 130/80 | Temp 99.9°F | Wt 190.0 lb

## 2011-08-20 DIAGNOSIS — IMO0002 Reserved for concepts with insufficient information to code with codable children: Secondary | ICD-10-CM

## 2011-08-20 DIAGNOSIS — L03113 Cellulitis of right upper limb: Secondary | ICD-10-CM

## 2011-08-20 MED ORDER — DOXYCYCLINE HYCLATE 100 MG PO TABS: 100.0000 mg | ORAL_TABLET | Freq: Two times a day (BID) | ORAL | Status: AC

## 2011-08-20 MED ORDER — HYDROCODONE-ACETAMINOPHEN 5-325 MG PO TABS: ORAL_TABLET | ORAL | Status: DC

## 2011-08-20 MED ORDER — DOXYCYCLINE HYCLATE 100 MG PO TABS: 100.0000 mg | ORAL_TABLET | Freq: Two times a day (BID) | ORAL | Status: DC

## 2011-08-20 NOTE — Progress Notes (Signed)
  Subjective:    Patient ID: Christian Kim, male    DOB: 1946-05-28, 66 y.o.   MRN: [id number]  HPI  Followup right upper extremity edema. Suspected cellulitis. His lab work including uric acid and CBC were unremarkable. We suspected cellulitis as most of his redness and warmth was confined to the forearm. He possibly had some low-grade fever over the weekend. We started Keflex and swelling is increased today. Erythema about the same. No progression of erythema. He has not any vomiting or nausea. No history of upper extremity injury.  ?hx of gout but erythematous streaks, ?LGF, and location (forearm) more compatible with cellulitis.  Review of Systems  Constitutional: Positive for fever. Negative for chills and appetite change.  Respiratory: Negative for shortness of breath.   Cardiovascular: Negative for chest pain.       Objective:   Physical Exam  Constitutional: He appears well-developed and well-nourished.  Cardiovascular: Normal rate.   Pulmonary/Chest: Effort normal and breath sounds normal. No respiratory distress. He has no wheezes. He has no rales.  Skin:       Patient has edema involving right hand and forearm. He has some erythema involving much of the forearm and warmth with tenderness to palpation. Good distal wrist pulses.  He does not have any edema or tenderness involving the arm. No elbow involvement.           Assessment & Plan:  Right wrist and forearm pain. Suspect cellulitis.  No progression of erythema but more edematous.  Add doxycycline 100 mg twice a day for MRSA coverage. Patient does not appear toxic in any way. Continue elevation. Reassess 2 days and sooner if he has any fever or worsening symptoms

## 2011-08-20 NOTE — Patient Instructions (Signed)
Follow up immediately for any increase fever, vomiting, or any progressive swelling or pain

## 2011-08-20 NOTE — Telephone Encounter (Signed)
Wife is asking that the antibiotic from today be sent to [facility] at [place] instead of [place], please.

## 2011-08-21 NOTE — Progress Notes (Signed)
Quick Note:  Pt informed on cell VM ______ 

## 2011-08-22 ENCOUNTER — Telehealth: Payer: Self-pay

## 2011-08-22 MED ORDER — COLCHICINE 0.6 MG PO TABS: 0.6000 mg | ORAL_TABLET | Freq: Two times a day (BID) | ORAL | Status: DC

## 2011-08-22 NOTE — Telephone Encounter (Signed)
Would restrict to no lifting over 10 pounds involved hand/wrist for 2 weeks.

## 2011-08-22 NOTE — Telephone Encounter (Signed)
Shall we fill out the work/school form on my desk?

## 2011-08-22 NOTE — Telephone Encounter (Signed)
Pt was seen on 08/20/11 by Dr. Caryl Never for swelling in right arm.  Pt states the medication is working well and the swelling has gone down in his arm.  Per pt now he is having swelling in his right foot.  Pt has a concern that this could be related to gout.  Pt would like to know any recommendations. Pls advise.

## 2011-08-22 NOTE — Telephone Encounter (Signed)
Yes.  Start colcrys 0.6 mg one po bid and let's reassess within 2 weeks.

## 2011-08-22 NOTE — Telephone Encounter (Signed)
Pt is aware.  Pt states that his employer is calling him requesting a letter about pt's cellulitis in his wrist since pt uses his hands a lot with work.  Pt states the letter can be faxed to Attn:  Alona Bene at [phone].  Pt would like a call back when this is done.  Thanks

## 2011-08-22 NOTE — Telephone Encounter (Signed)
Pt wants to speak to [place] only.  Call [phone]

## 2011-08-23 NOTE — Telephone Encounter (Signed)
I spoke with pt, informed of restriction, faxed to provider, confirmation received.

## 2011-08-27 ENCOUNTER — Telehealth: Payer: Self-pay | Admitting: *Deleted

## 2011-08-27 NOTE — Telephone Encounter (Signed)
Pt wants to talk to [place] about changing his appt this week, please.

## 2011-08-27 NOTE — Telephone Encounter (Signed)
LMTCB

## 2011-08-30 ENCOUNTER — Encounter: Payer: Self-pay | Admitting: Family Medicine

## 2011-08-30 ENCOUNTER — Telehealth: Payer: Self-pay

## 2011-08-30 ENCOUNTER — Ambulatory Visit (INDEPENDENT_AMBULATORY_CARE_PROVIDER_SITE_OTHER): Payer: Medicare Other | Admitting: Family Medicine

## 2011-08-30 VITALS — BP 158/88 | Temp 98.2°F | Wt 188.0 lb

## 2011-08-30 DIAGNOSIS — L03113 Cellulitis of right upper limb: Secondary | ICD-10-CM

## 2011-08-30 DIAGNOSIS — I10 Essential (primary) hypertension: Secondary | ICD-10-CM

## 2011-08-30 DIAGNOSIS — IMO0002 Reserved for concepts with insufficient information to code with codable children: Secondary | ICD-10-CM

## 2011-08-30 NOTE — Telephone Encounter (Signed)
Letter written, signed and faxed.  Pt informed we are not getting confirmation that the fax went thru.  Pt will look into

## 2011-08-30 NOTE — Telephone Encounter (Signed)
Pt was seen today, his fax did not go through with number Dr Caryl Never wrote down, requested call back to review fax # for RTW forms

## 2011-08-30 NOTE — Telephone Encounter (Signed)
Pt called back and states his boss called and told pt he needs a letter on letterhead stating from 08/17/11 until current that pt was on light duty.

## 2011-08-30 NOTE — Telephone Encounter (Signed)
Pt sates he was seen today and his paperwork to return to work was filled out. Pt states box 8 was checked incorrectly and pt was informed he would be able to return to work unless this was corrected.  Fax number verfied [phone].  Please re-fax paperwork and make pt aware.

## 2011-08-30 NOTE — Progress Notes (Signed)
  Subjective:    Patient ID: Christian Kim, male    DOB: March 07, 1946, 66 y.o.   MRN: [id number]  HPI  Followup right hand, wrist, and forearm cellulitis. After addition of doxycycline to cephalexin symptoms greatly improved. No known hx of MRSA.  He has history of gout but we suspected cellulitis (fever and erythematous streaks up arm). He was taking low dose colchicine as well. Symptoms have almost fully resolved at this point and is ready to go back to full work. He's been out since 08/17/2011.   Review of Systems  Constitutional: Negative for fever and chills.  Musculoskeletal: Negative for arthralgias.       Objective:   Physical Exam  Constitutional: He appears well-developed and well-nourished.  Cardiovascular: Normal rate and regular rhythm.   Pulmonary/Chest: Effort normal and breath sounds normal. No respiratory distress. He has no wheezes. He has no rales.  Musculoskeletal: He exhibits no edema.       Redness, warmth, and erythema as well as edema right wrist and forearm have resolved          Assessment & Plan:  #1 cellulitis right wrist and forearm resolved. Return to full unrestricted work #2 hypertension with elevation today but patient off metoprolol for one day. Get back on blood pressure medications and followup with primary as scheduled

## 2011-08-31 ENCOUNTER — Telehealth: Payer: Self-pay | Admitting: *Deleted

## 2011-08-31 NOTE — Telephone Encounter (Signed)
Patient is calling because he gave the wrong fax number to [place].  The correct fax number is [phone].

## 2011-08-31 NOTE — Telephone Encounter (Signed)
Faxed paper work and letter to new fax, confirmation received, pt informed on home VM

## 2011-09-07 ENCOUNTER — Encounter: Payer: Self-pay | Admitting: *Deleted

## 2011-09-07 ENCOUNTER — Encounter: Payer: Self-pay | Admitting: Family Medicine

## 2011-09-07 ENCOUNTER — Ambulatory Visit (INDEPENDENT_AMBULATORY_CARE_PROVIDER_SITE_OTHER): Payer: Federal, State, Local not specified - PPO | Admitting: Family Medicine

## 2011-09-07 ENCOUNTER — Telehealth: Payer: Self-pay | Admitting: *Deleted

## 2011-09-07 VITALS — BP 150/100 | Temp 98.3°F | Wt 192.0 lb

## 2011-09-07 DIAGNOSIS — M25539 Pain in unspecified wrist: Secondary | ICD-10-CM

## 2011-09-07 DIAGNOSIS — M109 Gout, unspecified: Secondary | ICD-10-CM

## 2011-09-07 MED ORDER — PREDNISONE 10 MG PO TABS: ORAL_TABLET | ORAL | Status: DC

## 2011-09-07 NOTE — Telephone Encounter (Signed)
Pt is sch for today at noon

## 2011-09-07 NOTE — Telephone Encounter (Signed)
Lmom for pt to callback and sch ov

## 2011-09-07 NOTE — Telephone Encounter (Signed)
Please schedule OV, and thank you

## 2011-09-07 NOTE — Progress Notes (Signed)
  Subjective:    Patient ID: Christian Kim, male    DOB: [birth year], 66 y.o.   MRN: [id number]  HPI  Acute swelling pain and warmth right wrist and hand. Patient has history of probable gout. He had presented recently with redness and pain involving the forearm and we suspected this was cellulitis and he did eventually improve with doxycycline.  He had recent uric acid level which was 4.8 with normal CBC. He had been recently prescribed allopurinol and started back on this (on his own) 300 mg once daily about 2 weeks ago. He denies any fever or chills. No recent injury. Pain started acutely yesterday. Patient has colchicine but has not taken any. No recent nonsteroidals.   Review of Systems  Constitutional: Negative for fever and chills.  Musculoskeletal: Positive for arthralgias.  Skin: Negative for rash.       Objective:   Physical Exam  Constitutional: He appears well-developed and well-nourished.  Cardiovascular: Normal rate and regular rhythm.   Pulmonary/Chest: Effort normal and breath sounds normal. No respiratory distress. He has no wheezes. He has no rales.  Musculoskeletal:       Right wrist reveals mild erythema with very minimal edema and warmth with moderate tenderness. Full range of motion wrists.  He does not have any significant forearm erythema other than around the wrist region          Assessment & Plan:  Acute mono articular arthritis right wrist. Suspect gout.  Prednisone taper and start back colchicine 0.6 mg twice a day. We have explained if he starts back allopurinol we'll need to build up gradually to reduce risk of precipitating attack. Also concomitantly use colchicine 0.6 mg daily for suppression

## 2011-09-07 NOTE — Telephone Encounter (Signed)
Recurrence of right forearm cellulitis.  Wrist is swollen and can hardly move it today.  No fever.

## 2011-09-07 NOTE — Patient Instructions (Signed)
Hold allopurinol for now. Go back on colchicine one twice daily Follow up promptly for any fever or worsening redness or edema

## 2011-09-25 ENCOUNTER — Telehealth: Payer: Self-pay | Admitting: Cardiology

## 2011-09-25 NOTE — Telephone Encounter (Signed)
Walk in pt form " pt Dropped Off Medical Review Form" sent to Lauren/[employer]uc[employer] 09/25/11/KM

## 2011-10-04 ENCOUNTER — Telehealth: Payer: Self-pay | Admitting: Cardiology

## 2011-10-04 NOTE — Telephone Encounter (Signed)
Patient request return call regarding documentation that was left for doctor Riley Kill to fill out 2 wks ago.  Patient can be reached at [phone].

## 2011-10-04 NOTE — Telephone Encounter (Signed)
Returned call and notified pt that Lauren and Dr. Riley Kill were not in the office today and that I would forward message to Lauren for review tomorrow.  Pt agreed.

## 2011-10-10 ENCOUNTER — Telehealth: Payer: Self-pay | Admitting: Cardiology

## 2011-10-10 ENCOUNTER — Encounter: Payer: Self-pay | Admitting: Cardiology

## 2011-10-10 NOTE — Telephone Encounter (Signed)
Pt calling back re papers he dropped off 09-25-11, he called 10-04-11 and was told by cory he would be called 10-05-11, and as of today he says he hasn't been called back yet, would like a status call today [phone]

## 2011-10-10 NOTE — Progress Notes (Signed)
This encounter was created in error - please disregard.

## 2011-10-10 NOTE — Telephone Encounter (Signed)
I did not receive the message that the pt had called into the office on 10/04/11. The pt's paperwork is in Dr Rosalyn Charters folder for completion.

## 2011-10-10 NOTE — Telephone Encounter (Signed)
Dr Riley Kill completed paperwork for patient and he left a message on the pt's voicemail to contact our office. The pt's paperwork is located in Dr Rosalyn Charters yellow folder on my cart.  I also called the pt and left him a voicemail.  If the pt has issues with being on plavix and this effecting his job then Dr Riley Kill would like to schedule the pt to be seen in the next few weeks to discuss this issue.

## 2011-10-12 NOTE — Telephone Encounter (Signed)
Left message on pt's phone that letter is complete.  I am unsure if the pt wants to pick-up letter or if this needs to be faxed.  I will await response from patient.

## 2011-10-12 NOTE — Telephone Encounter (Signed)
New Problem: ° ° ° °Patient returned your call.  Please call back and feel free to leave a message. °

## 2011-10-16 NOTE — Telephone Encounter (Signed)
The pt came into the office today to pick-up paperwork. I explained to the pt that he is at an increased risk for bleeding due to plavix. The pt said that he had his physical at work and this is what started the request for information from Dr Riley Kill.  At this time the pt is going to turn in the letter and documents to his employer and if they still have any concerns then the pt will schedule an appointment with Dr Riley Kill to discuss plavix.

## 2011-11-19 ENCOUNTER — Other Ambulatory Visit: Payer: Self-pay | Admitting: Internal Medicine

## 2011-12-16 ENCOUNTER — Other Ambulatory Visit: Payer: Self-pay | Admitting: Internal Medicine

## 2011-12-18 ENCOUNTER — Telehealth: Payer: Self-pay | Admitting: Cardiology

## 2011-12-18 NOTE — Telephone Encounter (Signed)
Pt requesting call from lauren re form dr Riley Kill filled out, some questions were not answered

## 2011-12-18 NOTE — Telephone Encounter (Signed)
I spoke with the pt and he will bring paperwork into the office today.

## 2011-12-20 NOTE — Telephone Encounter (Signed)
Dr Riley Kill reviewed the pt's paperwork that was brought into the office.  Per Dr Riley Kill the pt needs an appointment to discuss issues with plavix.

## 2012-01-02 NOTE — Telephone Encounter (Signed)
I spoke with the Christian Kim and made him aware that Dr Riley Kill has to see him in the office after clinic to discuss his paperwork.  The Christian Kim will come into the office on 01/16/12 between 5:30 and 5:45 to meet with Dr Riley Kill.

## 2012-01-02 NOTE — Telephone Encounter (Signed)
Follow-up:    Patient called in wanting to know what the status of his paperwork was.  Please call back.

## 2012-01-10 ENCOUNTER — Other Ambulatory Visit: Payer: Self-pay | Admitting: Internal Medicine

## 2012-01-15 ENCOUNTER — Ambulatory Visit (INDEPENDENT_AMBULATORY_CARE_PROVIDER_SITE_OTHER): Payer: Medicare Other | Admitting: Internal Medicine

## 2012-01-15 ENCOUNTER — Encounter: Payer: Self-pay | Admitting: Internal Medicine

## 2012-01-15 VITALS — BP 180/94 | HR 60 | Temp 98.3°F | Wt 190.0 lb

## 2012-01-15 DIAGNOSIS — M109 Gout, unspecified: Secondary | ICD-10-CM

## 2012-01-15 DIAGNOSIS — K8050 Calculus of bile duct without cholangitis or cholecystitis without obstruction: Secondary | ICD-10-CM

## 2012-01-15 DIAGNOSIS — I10 Essential (primary) hypertension: Secondary | ICD-10-CM

## 2012-01-15 DIAGNOSIS — M2540 Effusion, unspecified joint: Secondary | ICD-10-CM

## 2012-01-15 MED ORDER — AMLODIPINE BESYLATE 5 MG PO TABS: 5.0000 mg | ORAL_TABLET | Freq: Every day | ORAL | Status: DC

## 2012-01-15 NOTE — Progress Notes (Signed)
Patient ID: Christian Kim, male   DOB: 1946-02-10, 66 y.o.   MRN: [id number] Scheduled for f/u of multiple medical problems but comes in with acute complaint of right knee pain One week hx of intermittent pain- can be severe- last night had dificulty sleeping  HTN- no home BPs  Past Medical History  Diagnosis Date  . Hyperlipidemia   . Hypertension   . CAD (coronary artery disease)   . Choledocholithiasis     recurrent/multiple ERCPs withi stone extraction and sphincterotomy     History   Social History  . Marital Status: Married    Spouse Name: N/A    Number of Children: N/A  . Years of Education: N/A   Occupational History  . retired TEFL teacher   . [employer]     works in Office manager now   Social History Main Topics  . Smoking status: Never Smoker   . Smokeless tobacco: Not on file  . Alcohol Use: 0.0 oz/week     rarely drinks  . Drug Use: No  . Sexually Active: Not on file   Other Topics Concern  . Not on file   Social History Narrative  . No narrative on file    Past Surgical History  Procedure Date  . Cholecystectomy   . Nasal septum surgery   . Coronary angioplasty with stent placement     Family History  Problem Relation Age of Onset  . Liver cancer Mother     unknown primary origin  . Lung cancer Mother   . Colon cancer Neg Hx     No Known Allergies  Current Outpatient Prescriptions on File Prior to Visit  Medication Sig Dispense Refill  . aspirin 81 MG tablet Take 81 mg by mouth daily.        [name] atorvastatin (LIPITOR) 20 MG tablet take 1 tablet by mouth once daily  90 tablet  3  . clopidogrel (PLAVIX) 75 MG tablet take 1 tablet by mouth once daily  90 tablet  2  . lisinopril (PRINIVIL,ZESTRIL) 40 MG tablet take 1 tablet by mouth once daily  90 tablet  1  . metoprolol succinate (TOPROL-XL) 50 MG 24 hr tablet take 1 tablet by mouth once daily  30 tablet  5  . PRILOSEC OTC 20 MG tablet take 1 tablet by mouth daily  28 tablet  5  .  ursodiol (ACTIGALL) 300 MG capsule take 2 capsules by mouth twice a day  364 capsule  3     patient denies chest pain, shortness of breath, orthopnea. Denies lower extremity edema, abdominal pain, change in appetite, change in bowel movements. Patient denies rashes, musculoskeletal complaints. No other specific complaints in a complete review of systems.   BP 198/108  Pulse 60  Temp 98.3 F (36.8 C) (Oral)  Wt 190 lb (86.183 kg)  Well-developed male in no acute distress. HEENT exam atraumatic, normocephalic, extraocular muscles are intact. Conjunctivae are pink without exudate. Neck is supple without lymphadenopathy, thyromegaly, jugular venous distention. Chest is clear to auscultation without increased work of breathing. Cardiac exam S1-S2 are regular. The PMI is normal. No significant murmurs or gallops. Abdominal exam active bowel sounds, soft, nontender. No abdominal bruits.  Decreased ROM left knee with flexion- effusion present. No erythema or warmth

## 2012-01-16 ENCOUNTER — Other Ambulatory Visit: Payer: Self-pay

## 2012-01-16 NOTE — Assessment & Plan Note (Signed)
No recurrent sxs 

## 2012-01-16 NOTE — Assessment & Plan Note (Signed)
Presumed gout Informed consent obtained: Sterile technique 1% lidocaine 20 gauge needle inserted into knee joint 10cc of clear fluid removed 1 cc of depmedrol injected

## 2012-01-22 ENCOUNTER — Ambulatory Visit (INDEPENDENT_AMBULATORY_CARE_PROVIDER_SITE_OTHER): Payer: Medicare Other | Admitting: Internal Medicine

## 2012-01-22 VITALS — BP 152/88

## 2012-01-22 DIAGNOSIS — I10 Essential (primary) hypertension: Secondary | ICD-10-CM

## 2012-01-22 MED ORDER — ALLOPURINOL 300 MG PO TABS: 300.0000 mg | ORAL_TABLET | Freq: Every day | ORAL | Status: DC

## 2012-02-05 ENCOUNTER — Other Ambulatory Visit: Payer: Self-pay | Admitting: Internal Medicine

## 2012-02-22 ENCOUNTER — Ambulatory Visit: Payer: Federal, State, Local not specified - PPO | Admitting: Internal Medicine

## 2012-03-04 ENCOUNTER — Encounter: Payer: Self-pay | Admitting: Internal Medicine

## 2012-03-04 ENCOUNTER — Ambulatory Visit (INDEPENDENT_AMBULATORY_CARE_PROVIDER_SITE_OTHER): Payer: Medicare Other | Admitting: Internal Medicine

## 2012-03-04 VITALS — BP 154/92

## 2012-03-04 DIAGNOSIS — I10 Essential (primary) hypertension: Secondary | ICD-10-CM

## 2012-03-04 MED ORDER — AMLODIPINE BESYLATE 10 MG PO TABS: 10.0000 mg | ORAL_TABLET | Freq: Every day | ORAL | Status: DC

## 2012-03-04 NOTE — Progress Notes (Signed)
Pt comes in for a BP check today.  Per Dr Cato Mulligan increase the amlodipine to 10 mg and recheck BP in one month.  Pt also had a rash break out from Allopurinol on his inner thighs.  He d/c the Allopurinol himself and the rash has slowly resolved.  Per Dr Cato Mulligan keep an eye on the rash and report back if it worsens.  Pt verbalized understanding of both.

## 2012-03-10 ENCOUNTER — Encounter: Payer: Self-pay | Admitting: Internal Medicine

## 2012-04-02 ENCOUNTER — Telehealth: Payer: Self-pay | Admitting: Family Medicine

## 2012-04-02 NOTE — Telephone Encounter (Signed)
Patient called to resched his BP check that was on 11/8. He wanted to wait until 11/21 or after, but I encouraged him to come this week. He declined an appt for any day this wk, stating he could not come in d/t work. I sched him for Tues 11/12, as Dr. Cato Mulligan is out of the office 11/11. Thanks.

## 2012-04-04 ENCOUNTER — Encounter: Payer: Federal, State, Local not specified - PPO | Admitting: *Deleted

## 2012-04-04 ENCOUNTER — Ambulatory Visit: Payer: Federal, State, Local not specified - PPO | Admitting: Internal Medicine

## 2012-04-08 ENCOUNTER — Encounter: Payer: Federal, State, Local not specified - PPO | Admitting: Internal Medicine

## 2012-04-18 ENCOUNTER — Encounter: Payer: Self-pay | Admitting: Internal Medicine

## 2012-04-18 ENCOUNTER — Ambulatory Visit (INDEPENDENT_AMBULATORY_CARE_PROVIDER_SITE_OTHER): Payer: Federal, State, Local not specified - PPO | Admitting: Internal Medicine

## 2012-04-18 VITALS — BP 164/94 | Temp 98.3°F | Wt 199.0 lb

## 2012-04-18 DIAGNOSIS — B356 Tinea cruris: Secondary | ICD-10-CM

## 2012-04-18 MED ORDER — NYSTATIN-TRIAMCINOLONE 100000-0.1 UNIT/GM-% EX OINT: TOPICAL_OINTMENT | Freq: Two times a day (BID) | CUTANEOUS | Status: DC

## 2012-04-18 NOTE — Progress Notes (Signed)
Patient ID: Christian Kim, male   DOB: 1946/05/23, 66 y.o.   MRN: [id number] 1 month hx of right sided groin rash-- spreading distally Rash is pruritic  Reviewed pmh and meds  Ros-- unremarkable  Exam- well demarcated rash right and left groin  Tinea Cruris-- treat with antifungal and topical steroid

## 2012-04-21 ENCOUNTER — Other Ambulatory Visit: Payer: Self-pay | Admitting: *Deleted

## 2012-04-21 MED ORDER — ATORVASTATIN CALCIUM 20 MG PO TABS: 20.0000 mg | ORAL_TABLET | Freq: Every day | ORAL | Status: DC

## 2012-05-19 ENCOUNTER — Other Ambulatory Visit: Payer: Self-pay | Admitting: *Deleted

## 2012-05-19 MED ORDER — METOPROLOL SUCCINATE ER 50 MG PO TB24: 50.0000 mg | ORAL_TABLET | Freq: Every day | ORAL | Status: DC

## 2012-05-23 ENCOUNTER — Other Ambulatory Visit: Payer: Self-pay | Admitting: *Deleted

## 2012-05-23 ENCOUNTER — Ambulatory Visit (INDEPENDENT_AMBULATORY_CARE_PROVIDER_SITE_OTHER): Payer: Federal, State, Local not specified - PPO | Admitting: Family Medicine

## 2012-05-23 ENCOUNTER — Telehealth: Payer: Self-pay | Admitting: *Deleted

## 2012-05-23 VITALS — BP 136/78 | HR 60 | Temp 98.2°F | Resp 16 | Ht 71.0 in | Wt 194.0 lb

## 2012-05-23 DIAGNOSIS — B356 Tinea cruris: Secondary | ICD-10-CM

## 2012-05-23 MED ORDER — NAFTIFINE HCL 1 % EX CREA: TOPICAL_CREAM | Freq: Two times a day (BID) | CUTANEOUS | Status: DC

## 2012-05-23 MED ORDER — FLUCONAZOLE 150 MG PO TABS: ORAL_TABLET | ORAL | Status: DC

## 2012-05-23 NOTE — Telephone Encounter (Signed)
Pt called stating he couldn't afford the nafitin (300.00) cost-- per dr Caryl Never- take the diflucan as directed and can use mycolog to use as ointment-Left message on machine For pt

## 2012-05-23 NOTE — Patient Instructions (Addendum)
Jock Itch Jock itch is a fungal infection of the skin in the groin area. It is sometimes called "ringworm" even though it is not caused by a worm. A fungus is a type of germ that thrives in dark, damp places.  CAUSES  This infection may spread from:  A fungus infection elsewhere on the body (such as athlete's foot).  Sharing towels or clothing. This infection is more common in:  Hot, humid climates.  People who wear tight-fitting clothing or wet bathing suits for long periods of time.  Athletes.  Overweight people.  People with diabetes. SYMPTOMS  Jock itch causes the following symptoms:  Red, pink or brown rash in the groin. Rash may spread to the thighs, anus, and buttocks.  Itching. DIAGNOSIS  Your caregiver may make the diagnosis by looking at the rash. Sometimes a skin scraping will be sent to test for fungus. Testing can be done either by looking under the microscope or by doing a culture (test to try to grow the fungus). A culture can take up to 2 weeks to come back. TREATMENT  Jock itch may be treated with:  Skin cream or ointment to kill fungus.  Medicine by mouth to kill fungus.  Skin cream or ointment to calm the itching.  Compresses or medicated powders to dry the infected skin. HOME CARE INSTRUCTIONS   Be sure to treat the rash completely. Follow your caregiver's instructions. It can take a couple of weeks to treat. If you do not treat the infection long enough, the rash can come back.  Wear loose-fitting clothing.  Men should wear cotton boxer shorts.  Women should wear cotton underwear.  Avoid hot baths.  Dry the groin area well after bathing. SEEK MEDICAL CARE IF:   Your rash is worse.  Your rash is spreading.  Your rash returns after treatment is finished.  Your rash is not gone in 4 weeks. Fungal infections are slow to respond to treatment. Some redness may remain for several weeks after the fungus is gone. SEEK IMMEDIATE MEDICAL CARE  IF:  The area becomes red, warm, tender, and swollen.  You have a fever. Document Released: 05/04/2002 Document Revised: 08/06/2011 Document Reviewed: 04/02/2008 Exit[employer] Patient Information 2013 [employer].  

## 2012-05-23 NOTE — Progress Notes (Signed)
  Subjective:    Patient ID: Christian Kim, male    DOB: Oct 05, 1945, 66 y.o.   MRN: [id number]  HPI  Persistent pruritic rash groin area especially right side. Present for several weeks. Patient has been using nystatin triamcinolone cream without much improvement. He has a separate rash which is mostly upper trunk and is more papular. No vesicles. No pustules. Using Benadryl at night which helps his itching somewhat. No history of diabetes. Chronic problems include history of hypertension, hyperlipidemia, and gout.   Review of Systems  Constitutional: Negative for fever and chills.  Skin: Positive for rash.       Objective:   Physical Exam  Constitutional: He appears well-developed and well-nourished.  Cardiovascular: Normal rate and regular rhythm.   Pulmonary/Chest: Effort normal and breath sounds normal. No respiratory distress. He has no wheezes. He has no rales.  Skin: Rash noted.       Patient has macular rash which is erythematous and well-demarcated borders especially right groin region. Slightly scaly. No pustules. No vesicles. Nontender          Assessment & Plan:  Tinea cruris. Resistant to nystatin. Try Naftin cream twice daily for 2-4 weeks. If not responding to that consider addition of fluconazole 150 mg once weekly for 2-4 weeks

## 2012-06-17 ENCOUNTER — Telehealth: Payer: Self-pay

## 2012-06-17 ENCOUNTER — Encounter: Payer: Self-pay | Admitting: Cardiology

## 2012-06-17 ENCOUNTER — Ambulatory Visit (INDEPENDENT_AMBULATORY_CARE_PROVIDER_SITE_OTHER): Payer: Medicare PPO | Admitting: Cardiology

## 2012-06-17 VITALS — BP 152/88 | HR 63 | Ht 70.0 in | Wt 192.0 lb

## 2012-06-17 DIAGNOSIS — E785 Hyperlipidemia, unspecified: Secondary | ICD-10-CM

## 2012-06-17 DIAGNOSIS — N289 Disorder of kidney and ureter, unspecified: Secondary | ICD-10-CM

## 2012-06-17 DIAGNOSIS — I2510 Atherosclerotic heart disease of native coronary artery without angina pectoris: Secondary | ICD-10-CM

## 2012-06-17 DIAGNOSIS — I10 Essential (primary) hypertension: Secondary | ICD-10-CM

## 2012-06-17 NOTE — Patient Instructions (Addendum)
Your physician wants you to follow-up in: 1 year with Dr. Cooper.  You will receive a reminder letter in the mail two months in advance. If you don't receive a letter, please call our office to schedule the follow-up appointment.  

## 2012-06-17 NOTE — Assessment & Plan Note (Addendum)
Continue current medication.  No recurrent angina.  Will have patient follow up in one year with Dr. Excell Seltzer.

## 2012-06-17 NOTE — Progress Notes (Signed)
HPI:  Patient returns in a followup visit. He is doing extremely well. He denies any chest pain or shortness of breath. He's not had any unusual bleeding. He has occasional skin reaction, was given some medication but it is periodic. He's not been on any medications at the present time. He feels well currently. He had lipid studies done a recent visit to the urgent care, and these revealed an LDL cholesterol that was at target. HDL slightly low at triglycerides mildly elevated. Also, it should be noted that his blood pressures at home have been 130 range, and was a bit more elevated today, but better when rechecked.   Current Outpatient Prescriptions  Medication Sig Dispense Refill  . amLODipine (NORVASC) 10 MG tablet Take 1 tablet (10 mg total) by mouth daily.  90 tablet  3  . aspirin 81 MG tablet Take 81 mg by mouth daily.        [name] atorvastatin (LIPITOR) 20 MG tablet Take 1 tablet (20 mg total) by mouth daily.  90 tablet  0  . lisinopril (PRINIVIL,ZESTRIL) 40 MG tablet take 1 tablet by mouth once daily  90 tablet  1  . metoprolol succinate (TOPROL-XL) 50 MG 24 hr tablet Take 1 tablet (50 mg total) by mouth daily. Take with or immediately following a meal.  30 tablet  5  . nystatin-triamcinolone ointment (MYCOLOG) Apply topically 2 (two) times daily.  30 g  0  . PRILOSEC OTC 20 MG tablet take 1 tablet by mouth daily  28 tablet  5  . ursodiol (ACTIGALL) 300 MG capsule take 2 capsules by mouth twice a day  364 capsule  3    Allergies  Allergen Reactions  . Allopurinol Rash    Past Medical History  Diagnosis Date  . Hyperlipidemia   . Hypertension   . CAD (coronary artery disease)   . Choledocholithiasis     recurrent/multiple ERCPs withi stone extraction and sphincterotomy     Past Surgical History  Procedure Date  . Cholecystectomy   . Nasal septum surgery   . Coronary angioplasty with stent placement     Family History  Problem Relation Age of Onset  . Liver cancer Mother       unknown primary origin  . Lung cancer Mother   . Colon cancer Neg Hx     History   Social History  . Marital Status: Married    Spouse Name: N/A    Number of Children: N/A  . Years of Education: N/A   Occupational History  . retired TEFL teacher   . [employer]     works in Office manager now   Social History Main Topics  . Smoking status: Never Smoker   . Smokeless tobacco: Not on file  . Alcohol Use: 0.0 oz/week     Comment: rarely drinks  . Drug Use: No  . Sexually Active: Not on file   Other Topics Concern  . Not on file   Social History Narrative  . No narrative on file    ROS: Please see the HPI.  All other systems reviewed and negative.  PHYSICAL EXAM:  BP 152/88  Pulse 63  Ht 5\' 10"  (1.778 m)  Wt 192 lb (87.091 kg)  BMI 27.55 kg/m2  130/70 when rechecked by me.    General: Well developed, well nourished, in no acute distress. Head:  Normocephalic and atraumatic. Neck: no JVD Lungs: Clear to auscultation and percussion. Heart: Normal S1 and S2.  No  murmur, rubs or gallops.  Pulses: Pulses normal in all 4 extremities. Extremities: No clubbing or cyanosis. No edema. Neurologic: Alert and oriented x 3.  EKG:  Awaiting ECG from Urgent care.    ASSESSMENT AND PLAN:

## 2012-06-17 NOTE — Assessment & Plan Note (Signed)
CKD 2 by numbers.  Last cr 1.27

## 2012-06-17 NOTE — Assessment & Plan Note (Addendum)
Mildly elevated.  However, pressures normally about 130 systolic.

## 2012-06-17 NOTE — Assessment & Plan Note (Signed)
Stable on current medications.  No change in treatment.

## 2012-06-17 NOTE — Telephone Encounter (Signed)
patient's nurse  Kandee Keen at [facility] cardiology needs recent EKG in chart for patient. Please fax at [phone]. Thank you!

## 2012-06-18 NOTE — Telephone Encounter (Signed)
EKG faxed with confirmation to [facility] at [phone].

## 2012-06-27 ENCOUNTER — Other Ambulatory Visit: Payer: Self-pay | Admitting: Internal Medicine

## 2012-07-02 ENCOUNTER — Encounter: Payer: Self-pay | Admitting: Cardiology

## 2012-07-17 ENCOUNTER — Other Ambulatory Visit: Payer: Self-pay | Admitting: Family Medicine

## 2012-07-20 ENCOUNTER — Other Ambulatory Visit: Payer: Self-pay | Admitting: Internal Medicine

## 2012-09-09 ENCOUNTER — Other Ambulatory Visit: Payer: Self-pay | Admitting: *Deleted

## 2012-09-09 MED ORDER — ATORVASTATIN CALCIUM 20 MG PO TABS: 20.0000 mg | ORAL_TABLET | Freq: Every day | ORAL | Status: DC

## 2012-09-10 ENCOUNTER — Other Ambulatory Visit: Payer: Self-pay | Admitting: *Deleted

## 2012-11-22 ENCOUNTER — Other Ambulatory Visit: Payer: Self-pay | Admitting: Internal Medicine

## 2012-12-05 ENCOUNTER — Ambulatory Visit (INDEPENDENT_AMBULATORY_CARE_PROVIDER_SITE_OTHER): Payer: Medicare PPO | Admitting: Family Medicine

## 2012-12-05 ENCOUNTER — Encounter: Payer: Self-pay | Admitting: Family Medicine

## 2012-12-05 ENCOUNTER — Encounter: Payer: Self-pay | Admitting: Internal Medicine

## 2012-12-05 VITALS — BP 120/68 | HR 81 | Temp 100.4°F | Wt 187.0 lb

## 2012-12-05 DIAGNOSIS — J069 Acute upper respiratory infection, unspecified: Secondary | ICD-10-CM

## 2012-12-05 MED ORDER — DOXYCYCLINE HYCLATE 100 MG PO TABS: 100.0000 mg | ORAL_TABLET | Freq: Two times a day (BID) | ORAL | Status: DC

## 2012-12-05 MED ORDER — HYDROCODONE-HOMATROPINE 5-1.5 MG/5ML PO SYRP: 5.0000 mL | ORAL_SOLUTION | Freq: Three times a day (TID) | ORAL | Status: DC | PRN

## 2012-12-05 NOTE — Progress Notes (Signed)
Chief Complaint  Patient presents with  . Sore Throat    hoarseness, cough, headache    HPI:  Acute visit for cough and sore throat: -started: 1 week ago -symptoms: drainage in throat, cough, nasal congestion, chills on and off, hoariness - seems like cough has worsened -denies: SOB, NVD, tooth pain, ear pain -has tried: OTC cough medication -sick contacts: none -no recent travel  ROS: See pertinent positives and negatives per HPI.  Past Medical History  Diagnosis Date  . Hyperlipidemia   . Hypertension   . CAD (coronary artery disease)   . Choledocholithiasis     recurrent/multiple ERCPs withi stone extraction and sphincterotomy     Family History  Problem Relation Age of Onset  . Liver cancer Mother     unknown primary origin  . Lung cancer Mother   . Colon cancer Neg Hx     History   Social History  . Marital Status: Married    Spouse Name: N/A    Number of Children: N/A  . Years of Education: N/A   Occupational History  . retired TEFL teacher   . [employer]     works in Office manager now   Social History Main Topics  . Smoking status: Never Smoker   . Smokeless tobacco: None  . Alcohol Use: 0.0 oz/week     Comment: rarely drinks  . Drug Use: No  . Sexually Active: None   Other Topics Concern  . None   Social History Narrative  . None    Current outpatient prescriptions:amLODipine (NORVASC) 10 MG tablet, Take 1 tablet (10 mg total) by mouth daily., Disp: 90 tablet, Rfl: 3;  aspirin 81 MG tablet, Take 81 mg by mouth daily.  , Disp: , Rfl: ;  atorvastatin (LIPITOR) 20 MG tablet, Take 1 tablet (20 mg total) by mouth daily., Disp: 90 tablet, Rfl: 3;  COLCRYS 0.6 MG tablet, take 1 tablet by mouth twice a day, Disp: 60 tablet, Rfl: 0 lisinopril (PRINIVIL,ZESTRIL) 40 MG tablet, take 1 tablet by mouth once daily, Disp: 90 tablet, Rfl: 1;  metoprolol succinate (TOPROL-XL) 50 MG 24 hr tablet, take 1 tablet by mouth once daily WITH OR IMMEDIATELY  FOLLOWING A MEAL, Disp: 30 tablet, Rfl: 5;  nystatin-triamcinolone ointment (MYCOLOG), Apply topically 2 (two) times daily., Disp: 30 g, Rfl: 0;  PRILOSEC OTC 20 MG tablet, take 1 tablet by mouth daily, Disp: 28 tablet, Rfl: 5 ursodiol (ACTIGALL) 300 MG capsule, take 2 capsules by mouth twice a day, Disp: 364 capsule, Rfl: 3;  doxycycline (VIBRA-TABS) 100 MG tablet, Take 1 tablet (100 mg total) by mouth 2 (two) times daily., Disp: 20 tablet, Rfl: 0;  HYDROcodone-homatropine (HYCODAN) 5-1.5 MG/5ML syrup, Take 5 mLs by mouth every 8 (eight) hours as needed for cough., Disp: 120 mL, Rfl: 0  EXAM:  Filed Vitals:   12/05/12 0920  BP: 120/68  Pulse: 81  Temp: 100.4 F (38 C)    Body mass index is 26.83 kg/(m^2).  GENERAL: vitals reviewed and listed above, alert, oriented, appears well hydrated and in no acute distress  HEENT: atraumatic, conjunttiva clear, no obvious abnormalities on inspection of external nose and ears, normal appearance of ear canals and TMs, thick white nasal congestion, mild post oropharyngeal erythema with PND, no tonsillar edema or exudate, no sinus TTP  NECK: no obvious masses on inspection  LUNGS: clear to auscultation bilaterally, no wheezes, rales or rhonchi, good air movement  CV: HRRR, no peripheral edema  MS: moves  all extremities without noticeable abnormality  PSYCH: pleasant and cooperative, no obvious depression or anxiety  ASSESSMENT AND PLAN:  Discussed the following assessment and plan:  Upper respiratory infection - Plan: doxycycline (VIBRA-TABS) 100 MG tablet, HYDROcodone-homatropine (HYCODAN) 5-1.5 MG/5ML syrup  -given low grade fever, worsening will tx with abx - discussed risks. Cough medication - risks discussed. Return precuations. -Patient advised to return or notify a doctor immediately if symptoms worsen or persist or new concerns arise.  Patient Instructions  -As we discussed, we have prescribed a new medication for you at this  appointment. We discussed the common and serious potential adverse effects of this medication and you can review these and more with the pharmacist when you pick up your medication.  Please follow the instructions for use carefully and notify us immediately if you have any problems taking this medication.  -follow up if worsening or not improving     Mikyla Schachter R.

## 2012-12-05 NOTE — Patient Instructions (Signed)

## 2012-12-06 ENCOUNTER — Other Ambulatory Visit: Payer: Self-pay | Admitting: Internal Medicine

## 2012-12-07 ENCOUNTER — Other Ambulatory Visit: Payer: Self-pay | Admitting: Internal Medicine

## 2012-12-11 ENCOUNTER — Telehealth: Payer: Self-pay | Admitting: Internal Medicine

## 2012-12-11 NOTE — Telephone Encounter (Signed)
noted 

## 2012-12-11 NOTE — Telephone Encounter (Signed)
RN attempted to reach patient for cough/congestion.  On callback, unable to reach patient at number given; message left on unidentified voicemail.  krs/can

## 2012-12-11 NOTE — Telephone Encounter (Signed)
Caller: Elijiah/Patient; Phone: [phone]; Reason for Call: Returning a call to Natalia Leatherwood, who left a message on his home phone instead of cell phone.

## 2012-12-12 ENCOUNTER — Ambulatory Visit (INDEPENDENT_AMBULATORY_CARE_PROVIDER_SITE_OTHER): Payer: Medicare PPO | Admitting: Internal Medicine

## 2012-12-12 ENCOUNTER — Ambulatory Visit (INDEPENDENT_AMBULATORY_CARE_PROVIDER_SITE_OTHER)
Admission: RE | Admit: 2012-12-12 | Discharge: 2012-12-12 | Disposition: A | Discharge status: Home / Self Care | Payer: Medicare PPO | Source: Ambulatory Visit | Attending: Internal Medicine | Admitting: Internal Medicine

## 2012-12-12 ENCOUNTER — Other Ambulatory Visit: Payer: Self-pay | Admitting: Family Medicine

## 2012-12-12 ENCOUNTER — Encounter: Payer: Self-pay | Admitting: Internal Medicine

## 2012-12-12 ENCOUNTER — Telehealth: Payer: Self-pay | Admitting: Internal Medicine

## 2012-12-12 VITALS — BP 120/64 | HR 58 | Temp 98.4°F | Wt 189.0 lb

## 2012-12-12 DIAGNOSIS — J189 Pneumonia, unspecified organism: Secondary | ICD-10-CM

## 2012-12-12 DIAGNOSIS — R05 Cough: Secondary | ICD-10-CM

## 2012-12-12 DIAGNOSIS — J069 Acute upper respiratory infection, unspecified: Secondary | ICD-10-CM

## 2012-12-12 DIAGNOSIS — J329 Chronic sinusitis, unspecified: Secondary | ICD-10-CM | POA: Insufficient documentation

## 2012-12-12 DIAGNOSIS — I2510 Atherosclerotic heart disease of native coronary artery without angina pectoris: Secondary | ICD-10-CM

## 2012-12-12 DIAGNOSIS — Z8701 Personal history of pneumonia (recurrent): Secondary | ICD-10-CM | POA: Insufficient documentation

## 2012-12-12 MED ORDER — LEVOFLOXACIN 750 MG PO TABS: 750.0000 mg | ORAL_TABLET | Freq: Every day | ORAL | Status: DC

## 2012-12-12 MED ORDER — HYDROCODONE-HOMATROPINE 5-1.5 MG/5ML PO SYRP: 5.0000 mL | ORAL_SOLUTION | Freq: Four times a day (QID) | ORAL | Status: DC | PRN

## 2012-12-12 NOTE — Telephone Encounter (Signed)
Pt has appt for 12/12/12.

## 2012-12-12 NOTE — Telephone Encounter (Signed)
Patient Information:  Caller Name: Kristofor  Phone: [phone]  Patient: Christian Kim, Christian Kim  Gender: Male  DOB: [birth year]  Age: 67 Years  PCP: Birdie Sons (Adults only)  Office Follow Up:  Does the office need to follow up with this patient?: No  Instructions For The Office: N/A  RN Note:  Pt's Cough is not improving after Doxycycline and Hycodan prescribed on 7-11.  Pt states Coughing is constant, unable to sleep at night w/ almost vomiting.  Discussed honey w/ herbal tea.  Symptoms  Reason For Call & Symptoms: Cough, Runny Nose, Watery Eyes.  Reviewed Health History In EMR: Yes  Reviewed Medications In EMR: Yes  Reviewed Allergies In EMR: Yes  Reviewed Surgeries / Procedures: Yes  Date of Onset of Symptoms: 11/28/2012  Treatments Tried: Doxycycline and Hycodan  Treatments Tried Worked: No  Guideline(s) Used:  Cough  Disposition Per Guideline:   See Today or Tomorrow in Office  Reason For Disposition Reached:   Continuous (nonstop) coughing interferes with work or school and no improvement using cough treatment per Care Advice  Advice Given:  N/A  Patient Will Follow Care Advice:  YES  Appointment Scheduled:  12/12/2012 13:15:00 Appointment Scheduled Provider:  Berniece Andreas (Family Practice)

## 2012-12-12 NOTE — Progress Notes (Signed)
Chief Complaint  Patient presents with  . Cough    HPI: Patient comes in today for SDA for  Acute problem evaluation. PCP and Dr Selena Batten NA    On going problem onset about 2 weeks ago fever cough upper respiratory symptoms. Was given doxycycline cough medicine. He thinks his fever is gone other he does get chills and malaise and fatigue but he continues to have coughing greally bad      has continued upper respiratory nasal discharge thick green and yellow but no face pain. Neg hx  Asthma or tobacco .   Hoarse. No stridor vomiting diarrhea. Hard to sleep . Cause of coughing but no real shortness of breath or chest pain with this.  ROS: See pertinent positives and negatives per HPI.  Past Medical History  Diagnosis Date  . Hyperlipidemia   . Hypertension   . CAD (coronary artery disease)   . Choledocholithiasis     recurrent/multiple ERCPs withi stone extraction and sphincterotomy     Family History  Problem Relation Age of Onset  . Liver cancer Mother     unknown primary origin  . Lung cancer Mother   . Colon cancer Neg Hx     History   Social History  . Marital Status: Married    Spouse Name: N/A    Number of Children: N/A  . Years of Education: N/A   Occupational History  . retired TEFL teacher   . [employer]     works in Office manager now   Social History Main Topics  . Smoking status: Never Smoker   . Smokeless tobacco: Never Used  . Alcohol Use: 0.0 oz/week     Comment: rarely drinks  . Drug Use: No  . Sexually Active: None   Other Topics Concern  . None   Social History Narrative  . None    Outpatient Encounter Prescriptions as of 12/12/2012  Medication Sig Dispense Refill  . amLODipine (NORVASC) 10 MG tablet Take 1 tablet (10 mg total) by mouth daily.  90 tablet  3  . aspirin 81 MG tablet Take 81 mg by mouth daily.        [name] atorvastatin (LIPITOR) 20 MG tablet Take 1 tablet (20 mg total) by mouth daily.  90 tablet  3  . colchicine (COLCRYS)  0.6 MG tablet       . doxycycline (VIBRA-TABS) 100 MG tablet Take 1 tablet (100 mg total) by mouth 2 (two) times daily.  20 tablet  0  . lisinopril (PRINIVIL,ZESTRIL) 40 MG tablet take 1 tablet by mouth once daily  90 tablet  1  . metoprolol succinate (TOPROL-XL) 50 MG 24 hr tablet take 1 tablet by mouth once daily WITH OR IMMEDIATELY FOLLOWING A MEAL  30 tablet  5  . PRILOSEC OTC 20 MG tablet take 1 tablet by mouth daily  28 tablet  5  . ursodiol (ACTIGALL) 300 MG capsule take 2 capsules by mouth twice a day  364 capsule  0  . [DISCONTINUED] COLCRYS 0.6 MG tablet take 1 tablet by mouth twice a day  60 tablet  0  . HYDROcodone-homatropine (HYCODAN) 5-1.5 MG/5ML syrup Take 5 mLs by mouth every 6 (six) hours as needed for cough.  140 mL  0  . levofloxacin (LEVAQUIN) 750 MG tablet Take 1 tablet (750 mg total) by mouth daily.  7 tablet  0  . nystatin-triamcinolone ointment (MYCOLOG) Apply topically 2 (two) times daily.  30 g  0  . [DISCONTINUED]  HYDROcodone-homatropine (HYCODAN) 5-1.5 MG/5ML syrup Take 5 mLs by mouth every 8 (eight) hours as needed for cough.  120 mL  0   No facility-administered encounter medications on file as of 12/12/2012.    EXAM:  BP 120/64  Pulse 58  Temp(Src) 98.4 F (36.9 C)  Wt 189 lb (85.73 kg)  BMI 27.12 kg/m2  SpO2 98%  Body mass index is 27.12 kg/(m^2).  GENERAL: vitals reviewed and listed above, alert, oriented, appears well hydrated and in no acute distress nontoxic but looks tired normal respirations HEENT: atraumatic, conjunctiva  clear, no obvious abnormalities on inspection of external nose and ears congested nares face non-tender  OP : no lesion edema or exudate mild redness. NECK: no obvious masses on inspection palpation no adenopathy LUNGS: Question decreased breath sounds a few crackles at the left base clear with coughing no acute wheezes or rales. CV: HRRR, no clubbing cyanosis or  peripheral edema nl cap refill  MS: moves all extremities without  noticeable focal  abnormality PSYCH: pleasant and cooperative, no obvious depression or anxiety  ASSESSMENT AND PLAN:  Discussed the following assessment and plan:  Cough, persistent - prolongesd and severe  with sinusitis sx  s/p rx with doxy check chest x-ray rule out pneumonia - Plan: colchicine (COLCRYS) 0.6 MG tablet, HYDROcodone-homatropine (HYCODAN) 5-1.5 MG/5ML syrup, DG Chest 2 View  Upper respiratory infection - Plan: colchicine (COLCRYS) 0.6 MG tablet, HYDROcodone-homatropine (HYCODAN) 5-1.5 MG/5ML syrup, DG Chest 2 View  Sinusitis - Plan: colchicine (COLCRYS) 0.6 MG tablet, HYDROcodone-homatropine (HYCODAN) 5-1.5 MG/5ML syrup, DG Chest 2 View  CORONARY ARTERY DISEASE  Left lower lobe pneumonia - Versus atelectasis but clinically more consistent with pneumonia. Possibly partially treated change to quinolone. Benefit more than risk  -Patient advised to return or notify health care team  if symptoms worsen or persist or new concerns arise.  Patient Instructions  This still could be a viral infection and continuing.     But  Could have sinus infection  Get a chest x ray   Today and  Will let you know results .  Will send in antibiotic   Depending on results.      Neta Mends. Panosh M.D.  Cathlean Sauer shows possible early left bronchopneumonia versus patchy atelectasis treated with Levaquin 750 one a day for week plan followup in 10-14 days or as needed.

## 2012-12-12 NOTE — Telephone Encounter (Signed)
Appt made for pt by CAN.

## 2012-12-12 NOTE — Patient Instructions (Signed)
This still could be a viral infection and continuing.     But  Could have sinus infection  Get a chest x ray   Today and  Will let you know results .  Will send in antibiotic   Depending on results.

## 2013-01-03 ENCOUNTER — Other Ambulatory Visit: Payer: Self-pay | Admitting: Internal Medicine

## 2013-01-30 ENCOUNTER — Encounter: Payer: Self-pay | Admitting: Family Medicine

## 2013-01-30 ENCOUNTER — Ambulatory Visit (INDEPENDENT_AMBULATORY_CARE_PROVIDER_SITE_OTHER): Payer: Medicare PPO | Admitting: Family Medicine

## 2013-01-30 VITALS — BP 134/82 | HR 62 | Temp 97.7°F | Wt 188.0 lb

## 2013-01-30 DIAGNOSIS — M109 Gout, unspecified: Secondary | ICD-10-CM

## 2013-01-30 MED ORDER — PREDNISONE 10 MG PO TABS: ORAL_TABLET | ORAL | Status: DC

## 2013-01-30 NOTE — Progress Notes (Signed)
  Subjective:    Patient ID: Christian Kim, male    DOB: 15-Jun-1945, 67 y.o.   MRN: [id number]  HPI Patient is seen regarding acute gout flare right wrist Typical of similar flareups in the past. No injury. Onset of symptoms about one week ago. He is noted warmth and some mild erythema. Pain is moderate to severe at times and is inhibited regular activities.  Generally, he gets about one to 3 flareups per year. He has had flares previously involving wrist and occasionally feet. Was previously on allopurinol had rash which may have possibly been related. Currently not taking any medications. He does not consume much alcohol. He has never found any clear dietary triggers. Denies any recent fever or chills  Past Medical History  Diagnosis Date  . Hyperlipidemia   . Hypertension   . CAD (coronary artery disease)   . Choledocholithiasis     recurrent/multiple ERCPs withi stone extraction and sphincterotomy    Past Surgical History  Procedure Laterality Date  . Cholecystectomy    . Nasal septum surgery    . Coronary angioplasty with stent placement      reports that he has never smoked. He has never used smokeless tobacco. He reports that  drinks alcohol. He reports that he does not use illicit drugs. family history includes Liver cancer in his mother; Lung cancer in his mother. There is no history of Colon cancer. Allergies  Allergen Reactions  . Allopurinol Rash      Review of Systems  Constitutional: Negative for fever and chills.  Musculoskeletal: Positive for arthralgias (right wrist).  Skin: Negative for rash.       Objective:   Physical Exam  Constitutional: He appears well-developed and well-nourished.  Cardiovascular: Normal rate and regular rhythm.   Pulmonary/Chest: Effort normal and breath sounds normal. No respiratory distress. He has no wheezes. He has no rales.  Musculoskeletal:  Right wrist reveals small effusion. He has some diffuse warmth and very faint  erythema. Moderate tenderness          Assessment & Plan:  Acute gout involving right wrist. Previous questionable intolerance to allopurinol. Prednisone taper for this acute flare. Discussed possible dietary triggers.  We discussed other possible preventatives such as Uloric and he will discuss with his new primary provider.

## 2013-01-30 NOTE — Patient Instructions (Addendum)
Gout  Gout is an inflammatory condition (arthritis) caused by a buildup of uric acid crystals in the joints. Uric acid is a chemical that is normally present in the blood. Under some circumstances, uric acid can form into crystals in your joints. This causes joint redness, soreness, and swelling (inflammation). Repeat attacks are common. Over time, uric acid crystals can form into masses (tophi) near a joint, causing disfigurement. Gout is treatable and often preventable.  CAUSES   The disease begins with elevated levels of uric acid in the blood. Uric acid is produced by your body when it breaks down a naturally found substance called purines. This also happens when you eat certain foods such as meats and fish. Causes of an elevated uric acid level include:   Being passed down from parent to child (heredity).   Diseases that cause increased uric acid production (obesity, psoriasis, some cancers).   Excessive alcohol use.   Diet, especially diets rich in meat and seafood.   Medicines, including certain cancer-fighting drugs (chemotherapy), diuretics, and aspirin.   Chronic kidney disease. The kidneys are no longer able to remove uric acid well.   Problems with metabolism.  Conditions strongly associated with gout include:   Obesity.   High blood pressure.   High cholesterol.   Diabetes.  Not everyone with elevated uric acid levels gets gout. It is not understood why some people get gout and others do not. Surgery, joint injury, and eating too much of certain foods are some of the factors that can lead to gout.  SYMPTOMS    An attack of gout comes on quickly. It causes intense pain with redness, swelling, and warmth in a joint.   Fever can occur.   Often, only one joint is involved. Certain joints are more commonly involved:   Base of the big toe.   Knee.   Ankle.   Wrist.   Finger.  Without treatment, an attack usually goes away in a few days to weeks. Between attacks, you usually will not have  symptoms, which is different from many other forms of arthritis.  DIAGNOSIS   Your caregiver will suspect gout based on your symptoms and exam. Removal of fluid from the joint (arthrocentesis) is done to check for uric acid crystals. Your caregiver will give you a medicine that numbs the area (local anesthetic) and use a needle to remove joint fluid for exam. Gout is confirmed when uric acid crystals are seen in joint fluid, using a special microscope. Sometimes, blood, urine, and X-ray tests are also used.  TREATMENT   There are 2 phases to gout treatment: treating the sudden onset (acute) attack and preventing attacks (prophylaxis).  Treatment of an Acute Attack   Medicines are used. These include anti-inflammatory medicines or steroid medicines.   An injection of steroid medicine into the affected joint is sometimes necessary.   The painful joint is rested. Movement can worsen the arthritis.   You may use warm or cold treatments on painful joints, depending which works best for you.   Discuss the use of coffee, vitamin C, or cherries with your caregiver. These may be helpful treatment options.  Treatment to Prevent Attacks  After the acute attack subsides, your caregiver may advise prophylactic medicine. These medicines either help your kidneys eliminate uric acid from your body or decrease your uric acid production. You may need to stay on these medicines for a very long time.  The early phase of treatment with prophylactic medicine can be associated   with an increase in acute gout attacks. For this reason, during the first few months of treatment, your caregiver may also advise you to take medicines usually used for acute gout treatment. Be sure you understand your caregiver's directions.  You should also discuss dietary treatment with your caregiver. Certain foods such as meats and fish can increase uric acid levels. Other foods such as dairy can decrease levels. Your caregiver can give you a list of foods  to avoid.  HOME CARE INSTRUCTIONS    Do not take aspirin to relieve pain. This raises uric acid levels.   Only take over-the-counter or prescription medicines for pain, discomfort, or fever as directed by your caregiver.   Rest the joint as much as possible. When in bed, keep sheets and blankets off painful areas.   Keep the affected joint raised (elevated).   Use crutches if the painful joint is in your leg.   Drink enough water and fluids to keep your urine clear or pale yellow. This helps your body get rid of uric acid. Do not drink alcoholic beverages. They slow the passage of uric acid.   Follow your caregiver's dietary instructions. Pay careful attention to the amount of protein you eat. Your daily diet should emphasize fruits, vegetables, whole grains, and fat-free or low-fat milk products.   Maintain a healthy body weight.  SEEK MEDICAL CARE IF:    You have an oral temperature above 102 F (38.9 C).   You develop diarrhea, vomiting, or any side effects from medicines.   You do not feel better in 24 hours, or you are getting worse.  SEEK IMMEDIATE MEDICAL CARE IF:    Your joint becomes suddenly more tender and you have:   Chills.   An oral temperature above 102 F (38.9 C), not controlled by medicine.  MAKE SURE YOU:    Understand these instructions.   Will watch your condition.   Will get help right away if you are not doing well or get worse.  Document Released: 05/11/2000 Document Revised: 08/06/2011 Document Reviewed: 08/22/2009  [employer] Patient Information 2014 [employer]

## 2013-02-06 ENCOUNTER — Other Ambulatory Visit: Payer: Self-pay | Admitting: Internal Medicine

## 2013-03-04 ENCOUNTER — Encounter: Payer: Self-pay | Admitting: Family Medicine

## 2013-03-04 ENCOUNTER — Ambulatory Visit (INDEPENDENT_AMBULATORY_CARE_PROVIDER_SITE_OTHER): Payer: Medicare PPO | Admitting: Family Medicine

## 2013-03-04 VITALS — BP 142/84 | HR 60 | Temp 98.1°F | Wt 185.2 lb

## 2013-03-04 DIAGNOSIS — I10 Essential (primary) hypertension: Secondary | ICD-10-CM

## 2013-03-04 DIAGNOSIS — I2510 Atherosclerotic heart disease of native coronary artery without angina pectoris: Secondary | ICD-10-CM

## 2013-03-04 DIAGNOSIS — Z23 Encounter for immunization: Secondary | ICD-10-CM

## 2013-03-04 DIAGNOSIS — M109 Gout, unspecified: Secondary | ICD-10-CM

## 2013-03-04 DIAGNOSIS — N289 Disorder of kidney and ureter, unspecified: Secondary | ICD-10-CM

## 2013-03-04 DIAGNOSIS — E785 Hyperlipidemia, unspecified: Secondary | ICD-10-CM

## 2013-03-04 MED ORDER — COLCHICINE 0.6 MG PO TABS: 0.6000 mg | ORAL_TABLET | Freq: Every day | ORAL | Status: DC | PRN

## 2013-03-04 NOTE — Addendum Note (Signed)
Addended by: Josph Macho A on: 03/04/2013 12:43 PM   Modules accepted: Orders

## 2013-03-04 NOTE — Assessment & Plan Note (Signed)
Asxs. Continue meds and aspirin.

## 2013-03-04 NOTE — Assessment & Plan Note (Signed)
Check FLP next blood work. 

## 2013-03-04 NOTE — Progress Notes (Signed)
Subjective:    Patient ID: Christian Kim, male    DOB: April 15, 1946, 67 y.o.   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    Gout - recent flare last month - prednisone significantly helped but still with residual pain.  Using ibuprofen for this.  Started getting sore despite NSAID use.  Has had several flares but not more than 2 per year.  Knows foods to avoid.  Allopurinol caused itchy rash. No results found for this basename: URICACID   Lab Results  Component Value Date   CREATININE 1.5 07/04/2011  h/o renal insuff in chart but recently better.  (last Cr 1.27, 05/2012)  HTN - tolerating bp meds well. No HA, vision changes, CP/tightness, SOB, leg swelling. HLD - tolerating lipitor at bedtime, no muscle aches.  Preventative: Last CPE unsure Flu shot today. Td 2003 Zostavax 2012 Pneumovax 2012  Medications and allergies reviewed and updated in chart.  Past histories reviewed and updated if relevant as below. Patient Active Problem List   Diagnosis Date Noted  . Sinusitis 12/12/2012  . Cough, persistent 12/12/2012  . Left lower lobe pneumonia 12/12/2012  . Gout 07/18/2011  . Renal insufficiency 10/06/2010  . CORONARY ARTERY DISEASE 10/29/2007  . CHOLEDOCHOLITHIASIS 09/20/2007  . HYPERLIPIDEMIA 11/12/2006  . HYPERTENSION 11/12/2006  . COMMON BILE DUCT OBSTRUCTION, HX OF 11/12/2006   Past Medical History  Diagnosis Date  . Hyperlipidemia   . Hypertension   . CAD (coronary artery disease) 2008    stent  . Choledocholithiasis     recurrent/multiple ERCPs withi stone extraction and sphincterotomy   . Gout    Past Surgical History  Procedure Laterality Date  . Cholecystectomy  2006    procedure needed to be revised 2/2 persistent gallstones  . Nasal septum surgery    . Coronary angioplasty with stent placement  2008    stent, Dr. Tedra Senegal   History  Substance Use Topics  . Smoking status: Never Smoker   . Smokeless  tobacco: Never Used  . Alcohol Use: 0.0 oz/week     Comment: rarely drinks   Family History  Problem Relation Age of Onset  . Liver cancer Mother     unknown primary origin  . Lung cancer Mother   . Colon cancer Neg Hx    Allergies  Allergen Reactions  . Allopurinol Rash   Current Outpatient Prescriptions on File Prior to Visit  Medication Sig Dispense Refill  . amLODipine (NORVASC) 10 MG tablet take 1 tablet by mouth once daily  90 tablet  0  . aspirin 81 MG tablet Take 81 mg by mouth daily.        [name] atorvastatin (LIPITOR) 20 MG tablet Take 1 tablet (20 mg total) by mouth daily.  90 tablet  3  . lisinopril (PRINIVIL,ZESTRIL) 40 MG tablet take 1 tablet by mouth once daily  90 tablet  1  . metoprolol succinate (TOPROL-XL) 50 MG 24 hr tablet take 1 tablet by mouth once daily WITH OR IMMEDIATELY FOLLOWING A MEAL  30 tablet  5  . PRILOSEC OTC 20 MG tablet take 1 tablet by mouth once daily  30 tablet  1  . ursodiol (ACTIGALL) 300 MG capsule take 2 capsules by mouth twice a day  364 capsule  0   No current facility-administered medications on file prior to visit.     Review of Systems Per HPI    Objective:   Physical Exam  Nursing  note and vitals reviewed. Constitutional: He appears well-developed and well-nourished. No distress.  HENT:  Mouth/Throat: Oropharynx is clear and moist. No oropharyngeal exudate.  Cardiovascular: Normal rate, regular rhythm, normal heart sounds and intact distal pulses.   No murmur heard. Pulmonary/Chest: Effort normal and breath sounds normal. No respiratory distress. He has no wheezes. He has no rales.  Musculoskeletal: He exhibits no edema.  No erythema or swelling of R wrist compared to L Tender to palpation lateral and dorsal wrist, limited ROM especially in extension.       Assessment & Plan:

## 2013-03-04 NOTE — Assessment & Plan Note (Signed)
Slightly elevated. Continue meds.  Check Cr next blood work. BP Readings from Last 3 Encounters:  03/04/13 142/84  01/30/13 134/82  12/12/12 120/64

## 2013-03-04 NOTE — Assessment & Plan Note (Signed)
Check Cr next blood draw.

## 2013-03-04 NOTE — Patient Instructions (Addendum)
Flu shot today. Good to meet you today, call us with quesitons. Take colchicine 1 pill today then one pill daily as needed for gout flare - no more than 5 days in a row.  Hold lipitor for 1-2 days if on this medicine. Once acute flare has resolved, we may consider starting uloric to help prevent future flares. Return at your convenience fasting for blood work and afterwards for [name] visit.

## 2013-03-04 NOTE — Assessment & Plan Note (Addendum)
Steroids did help, but persistent discomfort. Currently taking ibuprofen 400-600mg  to control discomfort. H/o renal insuff, but latest Cr 05/2012 1.27 with eGFR 58. Will treat with low dose colchicine (once daily) - discussed hold lipitor if takes, discussed hold NSAID if takes. Pt aware of dietary changes to help prevent gout flare.

## 2013-03-07 ENCOUNTER — Other Ambulatory Visit: Payer: Self-pay | Admitting: Internal Medicine

## 2013-05-06 ENCOUNTER — Other Ambulatory Visit: Payer: Self-pay | Admitting: Internal Medicine

## 2013-05-07 ENCOUNTER — Telehealth: Payer: Self-pay | Admitting: Internal Medicine

## 2013-05-07 MED ORDER — URSODIOL 300 MG PO CAPS: ORAL_CAPSULE | ORAL | Status: DC

## 2013-05-07 NOTE — Telephone Encounter (Signed)
Patient scheduled office visit; refilled Ursodial

## 2013-05-15 ENCOUNTER — Other Ambulatory Visit: Payer: Self-pay | Admitting: Internal Medicine

## 2013-05-19 ENCOUNTER — Other Ambulatory Visit: Payer: Self-pay | Admitting: Internal Medicine

## 2013-05-29 ENCOUNTER — Other Ambulatory Visit: Payer: Self-pay | Admitting: Family Medicine

## 2013-05-29 DIAGNOSIS — I10 Essential (primary) hypertension: Secondary | ICD-10-CM

## 2013-05-29 DIAGNOSIS — E785 Hyperlipidemia, unspecified: Secondary | ICD-10-CM

## 2013-05-29 DIAGNOSIS — Z125 Encounter for screening for malignant neoplasm of prostate: Secondary | ICD-10-CM

## 2013-05-29 DIAGNOSIS — M109 Gout, unspecified: Secondary | ICD-10-CM

## 2013-05-29 DIAGNOSIS — N289 Disorder of kidney and ureter, unspecified: Secondary | ICD-10-CM

## 2013-06-02 ENCOUNTER — Other Ambulatory Visit (INDEPENDENT_AMBULATORY_CARE_PROVIDER_SITE_OTHER): Payer: Medicare PPO

## 2013-06-02 DIAGNOSIS — I10 Essential (primary) hypertension: Secondary | ICD-10-CM

## 2013-06-02 DIAGNOSIS — Z125 Encounter for screening for malignant neoplasm of prostate: Secondary | ICD-10-CM

## 2013-06-02 DIAGNOSIS — N289 Disorder of kidney and ureter, unspecified: Secondary | ICD-10-CM

## 2013-06-02 DIAGNOSIS — M109 Gout, unspecified: Secondary | ICD-10-CM

## 2013-06-02 DIAGNOSIS — E785 Hyperlipidemia, unspecified: Secondary | ICD-10-CM

## 2013-06-02 LAB — COMPREHENSIVE METABOLIC PANEL
ALBUMIN: 4.4 g/dL (ref 3.5–5.2)
ALT: 32 U/L (ref 0–53)
AST: 23 U/L (ref 0–37)
Alkaline Phosphatase: 126 U/L — ABNORMAL HIGH (ref 39–117)
BUN: 20 mg/dL (ref 6–23)
CALCIUM: 9.3 mg/dL (ref 8.4–10.5)
CHLORIDE: 103 meq/L (ref 96–112)
CO2: 30 meq/L (ref 19–32)
Creatinine, Ser: 1.3 mg/dL (ref 0.4–1.5)
GFR: 59.94 mL/min — AB (ref >60.00)
Glucose, Bld: 94 mg/dL (ref 70–99)
POTASSIUM: 4.4 meq/L (ref 3.5–5.1)
Sodium: 140 mEq/L (ref 135–145)
Total Bilirubin: 1 mg/dL (ref 0.3–1.2)
Total Protein: 7.1 g/dL (ref 6.0–8.3)

## 2013-06-02 LAB — CBC WITH DIFFERENTIAL/PLATELET
Basophils Absolute: 0 10*3/uL (ref 0.0–0.1)
Basophils Relative: 0.2 % (ref 0.0–3.0)
EOS ABS: 0.2 10*3/uL (ref 0.0–0.7)
Eosinophils Relative: 4.4 % (ref 0.0–5.0)
HCT: 43.8 % (ref 39.0–52.0)
Hemoglobin: 14.9 g/dL (ref 13.0–17.0)
LYMPHS PCT: 25.7 % (ref 12.0–46.0)
Lymphs Abs: 1.4 10*3/uL (ref 0.7–4.0)
MCHC: 34.1 g/dL (ref 30.0–36.0)
MCV: 92.3 fl (ref 78.0–100.0)
MONO ABS: 0.4 10*3/uL (ref 0.1–1.0)
Monocytes Relative: 8 % (ref 3.0–12.0)
NEUTROS ABS: 3.4 10*3/uL (ref 1.4–7.7)
Neutrophils Relative %: 61.7 % (ref 43.0–77.0)
Platelets: 181 10*3/uL (ref 150.0–400.0)
RBC: 4.74 Mil/uL (ref 4.22–5.81)
RDW: 13.2 % (ref 11.5–14.6)
WBC: 5.5 10*3/uL (ref 4.5–10.5)

## 2013-06-02 LAB — PSA: PSA: 3.56 ng/mL (ref 0.10–4.00)

## 2013-06-02 LAB — LIPID PANEL
CHOL/HDL RATIO: 4
CHOLESTEROL: 132 mg/dL (ref 0–200)
HDL: 30.4 mg/dL — ABNORMAL LOW (ref >39.00)
Triglycerides: 233 mg/dL — ABNORMAL HIGH (ref 0.0–149.0)
VLDL: 46.6 mg/dL — ABNORMAL HIGH (ref 0.0–40.0)

## 2013-06-02 LAB — LDL CHOLESTEROL, DIRECT: Direct LDL: 70.9 mg/dL

## 2013-06-02 LAB — URIC ACID: Uric Acid, Serum: 7 mg/dL (ref 4.0–7.8)

## 2013-06-05 ENCOUNTER — Ambulatory Visit (INDEPENDENT_AMBULATORY_CARE_PROVIDER_SITE_OTHER): Payer: Medicare PPO | Admitting: Family Medicine

## 2013-06-05 ENCOUNTER — Ambulatory Visit (INDEPENDENT_AMBULATORY_CARE_PROVIDER_SITE_OTHER): Payer: Medicare PPO | Admitting: Internal Medicine

## 2013-06-05 ENCOUNTER — Encounter: Payer: Self-pay | Admitting: Family Medicine

## 2013-06-05 ENCOUNTER — Encounter: Payer: Self-pay | Admitting: Internal Medicine

## 2013-06-05 VITALS — BP 138/68 | HR 68 | Ht 69.29 in | Wt 195.0 lb

## 2013-06-05 VITALS — BP 136/78 | HR 64 | Temp 97.8°F | Ht 70.0 in | Wt 194.8 lb

## 2013-06-05 DIAGNOSIS — M109 Gout, unspecified: Secondary | ICD-10-CM

## 2013-06-05 DIAGNOSIS — B351 Tinea unguium: Secondary | ICD-10-CM

## 2013-06-05 DIAGNOSIS — R972 Elevated prostate specific antigen [PSA]: Secondary | ICD-10-CM

## 2013-06-05 DIAGNOSIS — I10 Essential (primary) hypertension: Secondary | ICD-10-CM

## 2013-06-05 DIAGNOSIS — Z1211 Encounter for screening for malignant neoplasm of colon: Secondary | ICD-10-CM

## 2013-06-05 DIAGNOSIS — I2510 Atherosclerotic heart disease of native coronary artery without angina pectoris: Secondary | ICD-10-CM

## 2013-06-05 DIAGNOSIS — E785 Hyperlipidemia, unspecified: Secondary | ICD-10-CM

## 2013-06-05 DIAGNOSIS — N289 Disorder of kidney and ureter, unspecified: Secondary | ICD-10-CM

## 2013-06-05 DIAGNOSIS — K219 Gastro-esophageal reflux disease without esophagitis: Secondary | ICD-10-CM

## 2013-06-05 DIAGNOSIS — K8050 Calculus of bile duct without cholangitis or cholecystitis without obstruction: Secondary | ICD-10-CM

## 2013-06-05 DIAGNOSIS — Z0000 Encounter for general adult medical examination without abnormal findings: Secondary | ICD-10-CM | POA: Insufficient documentation

## 2013-06-05 MED ORDER — MOVIPREP 100 G PO SOLR: 1.0000 | Freq: Once | ORAL | Status: DC

## 2013-06-05 MED ORDER — URSODIOL 300 MG PO CAPS: ORAL_CAPSULE | ORAL | Status: DC

## 2013-06-05 MED ORDER — OMEPRAZOLE MAGNESIUM 20 MG PO TBEC: 20.0000 mg | DELAYED_RELEASE_TABLET | Freq: Every day | ORAL | Status: DC

## 2013-06-05 NOTE — Assessment & Plan Note (Signed)
Chronic, stable. Continue meds. 

## 2013-06-05 NOTE — Progress Notes (Signed)
Pre-visit discussion using our clinic review tool. No additional management support is needed unless otherwise documented below in the visit note.  

## 2013-06-05 NOTE — Assessment & Plan Note (Signed)
I have personally reviewed the Medicare Annual Wellness questionnaire and have noted 1. The patient's medical and social history 2. Their use of alcohol, tobacco or illicit drugs 3. Their current medications and supplements 4. The patient's functional ability including ADL's, fall risks, home safety risks and hearing or visual impairment. 5. Diet and physical activity 6. Evidence for depression or mood disorders The patients weight, height, BMI have been recorded in the chart.  Hearing and vision has been addressed. I have made referrals, counseling and provided education to the patient based review of the above and I have provided the pt with a written personalized care plan for preventive services. See scanned questionairre. Advanced directives discussed: packet provided.  Reviewed preventative protocols and updated unless pt declined. Pending colonoscopy Watch PSA, DRE reassuring today.

## 2013-06-05 NOTE — Assessment & Plan Note (Signed)
Actually improvement noted.  Encouraged continued abstinence from NSAIDs.

## 2013-06-05 NOTE — Assessment & Plan Note (Signed)
reviewed blood work - trig elevated. Discussed dietary and lifestyle choices to lower chol levels

## 2013-06-05 NOTE — Patient Instructions (Signed)
Advanced directive packet provided today. We will recheck prostate next year. Watch triglycerides - Decrease added sugars, eliminate trans fats, increase fiber and limit alcohol.  All these changes together can drop triglycerides by almost 50%. Good to see you today, call us with quesitons Return as needed or in 1 year for next wellness exam.

## 2013-06-05 NOTE — Assessment & Plan Note (Signed)
Stable

## 2013-06-05 NOTE — Progress Notes (Signed)
HISTORY OF PRESENT ILLNESS:  Christian Kim is a 68 y.o. male with hypertension, hyperlipidemia, coronary artery disease with prior stent placement, gout, GERD, and recurrent choledocholithiasis post cholecystectomy for which he is undergone repeat ERCP with common duct clearance. Last seen in the office April 2012 for recurrent abdominal pain. Etiology unclear, though common duct stone recurrence was a concern. He subsequently underwent ERCP 09/26/2010. Examination revealed a normal biliary tree post cholecystectomy without evidence of recurrent choledocholithiasis. He was noted to have duodenitis at that time and was placed on omeprazole 20 mg daily. Since that time he reports doing well. No recurrence of abdominal pain. He remains on ursodiol in hopes of preventing recurrent choledocholithiasis. GI review of systems is currently negative. His last colonoscopy, with Dr. Sharlett Iles, was performed in 2003. Examination was normal. Recall colonoscopy letter sent to the patient. He did not respond. He is however interested in repeat screening.  REVIEW OF SYSTEMS:  All non-GI ROS negative except for gout  Past Medical History  Diagnosis Date  . Hyperlipidemia   . Hypertension   . CAD (coronary artery disease) 2008    stent  . Choledocholithiasis     recurrent/multiple ERCPs with stone extraction and sphincterotomy   . Gout 2013    dx by crystal analysis  . GERD (gastroesophageal reflux disease)     Past Surgical History  Procedure Laterality Date  . Cholecystectomy  2006    w/ revision 2/2 persistent gallstones  . Nasal septum surgery    . Coronary angioplasty with stent placement  2008    stent, Dr. Hyman Hopes    Social History Dierdre Searles  reports that he has never smoked. He has never used smokeless tobacco. He reports that he drinks alcohol. He reports that he does not use illicit drugs.  family history includes Liver cancer in his mother; Lung cancer in his mother. There is no  history of Colon cancer.  Allergies  Allergen Reactions  . Allopurinol Rash       PHYSICAL EXAMINATION: Vital signs: BP 138/68  Pulse 68  Ht 5' 9.29" (1.76 m)  Wt 195 lb (88.451 kg)  BMI 28.55 kg/m2 General: Well-developed, well-nourished, no acute distress HEENT: Sclerae are anicteric, conjunctiva pink. Oral mucosa intact Lungs: Clear Heart: Regular Abdomen: soft, nontender, nondistended, no obvious ascites, no peritoneal signs, normal bowel sounds. No organomegaly. Extremities: No edema Psychiatric: alert and oriented x3. Cooperative   ASSESSMENT:  #1. History of recurrent choledocholithiasis post cholecystectomy. No issues since his last ERCP 09/26/2010. He continues on ursodiol #2. GERD/duodenitis. On PPI. #3. Screening colonoscopy. Baseline risk. No contraindication #4. Multiple general medical problems. Stable  PLAN:  #1. Refill ursodiol #2. Refill omeprazole #3. Screening colonoscopy.The nature of the procedure, as well as the risks, benefits, and alternatives were carefully and thoroughly reviewed with the patient. Ample time for discussion and questions allowed. The patient understood, was satisfied, and agreed to proceed. Movi prep prescribed. The patient instructed on its use

## 2013-06-05 NOTE — Assessment & Plan Note (Signed)
Reassuring DRE today, will recheck next year.

## 2013-06-05 NOTE — Assessment & Plan Note (Signed)
Chronic, asxs. Continue lipitor and aspirin.

## 2013-06-05 NOTE — Assessment & Plan Note (Signed)
Isolated to L great toenail - recommended OTC funginail

## 2013-06-05 NOTE — Progress Notes (Signed)
Subjective:    Patient ID: Christian Kim, male    DOB: 1945-12-11, 68 y.o.   MRN: [id number]  HPI CC: medicare wellness exam  Woke up this morning with sore right wrist. Has colchicine to take at home.  HLD - knows to hold lipitor if colchicine used.  Passes hearing and vision screens. Denies depression/anhedonia, sadness. No falls in the last year.  Preventative:  Last CPE unsure  Colonoscopy - saw Dr. [name] today - scheduled upcoming colonoscopy 06/2013. Prostate cancer screening - has had routinely checked but not in last few years.  Denies nocturia or LUTS sxs. Flu shot 02/2013 Td 2003 - will defer this Zostavax 2012  Pneumovax 2012  Advanced directives: would like packet today  Medications and allergies reviewed and updated in chart.  Past histories reviewed and updated if relevant as below. Patient Active Problem List   Diagnosis Date Noted  . Left lower lobe pneumonia 12/12/2012  . Gout 07/18/2011  . Renal insufficiency 10/06/2010  . CORONARY ARTERY DISEASE 10/29/2007  . CHOLEDOCHOLITHIASIS 09/20/2007  . HYPERLIPIDEMIA 11/12/2006  . HYPERTENSION 11/12/2006  . COMMON BILE DUCT OBSTRUCTION, HX OF 11/12/2006   Past Medical History  Diagnosis Date  . Hyperlipidemia   . Hypertension   . CAD (coronary artery disease) 2008    stent  . Choledocholithiasis     recurrent/multiple ERCPs with stone extraction and sphincterotomy   . Gout 2013    dx by crystal analysis  . GERD (gastroesophageal reflux disease)    Past Surgical History  Procedure Laterality Date  . Cholecystectomy  2006    w/ revision 2/2 persistent gallstones  . Nasal septum surgery    . Coronary angioplasty with stent placement  2008    stent, Dr. Hyman Hopes   History  Substance Use Topics  . Smoking status: Never Smoker   . Smokeless tobacco: Never Used  . Alcohol Use: 0.0 oz/week     Comment: rarely drinks   Family History  Problem Relation Age of Onset  . Liver cancer Mother     unknown  primary origin  . Lung cancer Mother   . Colon cancer Neg Hx    Allergies  Allergen Reactions  . Allopurinol Rash   Current Outpatient Prescriptions on File Prior to Visit  Medication Sig Dispense Refill  . amLODipine (NORVASC) 10 MG tablet take 1 tablet by mouth once daily  90 tablet  0  . aspirin 81 MG tablet Take 81 mg by mouth daily.        [name] atorvastatin (LIPITOR) 20 MG tablet Take 1 tablet (20 mg total) by mouth daily.  90 tablet  3  . colchicine 0.6 MG tablet Take 1 tablet (0.6 mg total) by mouth daily as needed.  30 tablet  0  . ibuprofen (ADVIL,MOTRIN) 200 MG tablet Take 200 mg by mouth every 6 (six) hours as needed for pain.      [name] lisinopril (PRINIVIL,ZESTRIL) 40 MG tablet take 1 tablet by mouth once daily  90 tablet  1  . metoprolol succinate (TOPROL-XL) 50 MG 24 hr tablet take 1 tablet by mouth once daily WITH OR IMMEDIATELY FOLLOWING A MEAL  30 tablet  5   No current facility-administered medications on file prior to visit.     Review of Systems  Constitutional: Negative for fever, chills, activity change, appetite change, fatigue and unexpected weight change.  HENT: Negative for hearing loss.   Eyes: Negative for visual disturbance.  Respiratory: Negative for cough, chest tightness,  shortness of breath and wheezing.   Cardiovascular: Negative for chest pain, palpitations and leg swelling.  Gastrointestinal: Positive for blood in stool (last week, ?hemorrhoids, pending colonsocopy). Negative for nausea, vomiting, abdominal pain, diarrhea, constipation and abdominal distention.  Genitourinary: Negative for hematuria and difficulty urinating.  Musculoskeletal: Negative for arthralgias, myalgias and neck pain.  Skin: Negative for rash.  Neurological: Negative for dizziness, seizures, syncope and headaches.  Hematological: Negative for adenopathy. Does not bruise/bleed easily.  Psychiatric/Behavioral: Negative for dysphoric mood. The patient is not nervous/anxious.         Objective:   Physical Exam  Nursing note and vitals reviewed. Constitutional: He is oriented to person, place, and time. He appears well-developed and well-nourished. No distress.  HENT:  Head: Normocephalic and atraumatic.  Right Ear: Hearing, tympanic membrane, external ear and ear canal normal.  Left Ear: Hearing, tympanic membrane, external ear and ear canal normal.  Mouth/Throat: Oropharynx is clear and moist. No oropharyngeal exudate.  Eyes: Conjunctivae and EOM are normal. Pupils are equal, round, and reactive to light. No scleral icterus.  Neck: Normal range of motion. Neck supple. Carotid bruit is not present.  Cardiovascular: Normal rate, regular rhythm, normal heart sounds and intact distal pulses.   No murmur heard. Pulses:      Radial pulses are 2+ on the right side, and 2+ on the left side.  Pulmonary/Chest: Effort normal and breath sounds normal. No respiratory distress. He has no wheezes. He has no rales.  Abdominal: Soft. Bowel sounds are normal. He exhibits no distension and no mass. There is no tenderness. There is no rebound and no guarding.  Genitourinary: Rectum normal and prostate normal. Rectal exam shows no external hemorrhoid, no internal hemorrhoid, no fissure, no mass, no tenderness and anal tone normal. Prostate is not enlarged (20gm) and not tender.  Firm prostate throughout but no obvious induration  Musculoskeletal: Normal range of motion. He exhibits no edema.  Left great toe - thickened and slightly yellow  Lymphadenopathy:    He has no cervical adenopathy.  Neurological: He is alert and oriented to person, place, and time.  CN grossly intact, station and gait intact Normal serial 3s, 3/3 recall.  Skin: Skin is warm and dry. No rash noted.  Psychiatric: He has a normal mood and affect. His behavior is normal. Judgment and thought content normal.       Assessment & Plan:

## 2013-06-05 NOTE — Patient Instructions (Signed)
We have sent the following medications to your pharmacy for you to pick up at your convenience:  Prilosec, [place]  You have been scheduled for a colonoscopy with propofol. Please follow written instructions given to you at your visit today.  Please pick up your prep kit at the pharmacy within the next 1-3 days. If you use inhalers (even only as needed), please bring them with you on the day of your procedure. Your physician has requested that you go to www.startemmi.com and enter the access code given to you at your visit today. This web site gives a general overview about your procedure. However, you should still follow specific instructions given to you by our office regarding your preparation for the procedure.

## 2013-06-08 ENCOUNTER — Other Ambulatory Visit: Payer: Self-pay | Admitting: Internal Medicine

## 2013-06-23 ENCOUNTER — Ambulatory Visit (INDEPENDENT_AMBULATORY_CARE_PROVIDER_SITE_OTHER): Payer: Medicare PPO | Admitting: Cardiovascular Disease

## 2013-06-23 ENCOUNTER — Encounter: Payer: Self-pay | Admitting: Cardiovascular Disease

## 2013-06-23 VITALS — BP 134/78 | HR 59 | Ht 71.0 in | Wt 194.0 lb

## 2013-06-23 DIAGNOSIS — I2510 Atherosclerotic heart disease of native coronary artery without angina pectoris: Secondary | ICD-10-CM

## 2013-06-23 DIAGNOSIS — E785 Hyperlipidemia, unspecified: Secondary | ICD-10-CM

## 2013-06-23 NOTE — Progress Notes (Signed)
    HPI:  68 year old gentleman presenting for followup evaluation. He has been followed by Dr. Lia Foyer since 2008 when he presented with non-ST segment elevation MI. At that time he underwent complex PCI the left circumflex and a long area of total occlusion was treated with overlapping drug-eluting stents. He then underwent staged PCI of the right coronary artery during that same hospital admission. He was noted to have moderate LAD stenosis and this is been managed medically. Last lipids from January 6 demonstrated a cholesterol of 132, triglycerides 233, HDL 30, and LDL 71.  The patient is doing well. 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 He denies chest pain or pressure, dyspnea, or palpitations. He's been compliant with his medications. He stopped Plavix about a year ago.  Outpatient Encounter Prescriptions as of 06/23/2013  Medication Sig  . amLODipine (NORVASC) 10 MG tablet take 1 tablet by mouth once daily  . aspirin 81 MG tablet Take 81 mg by mouth daily.    [name] atorvastatin (LIPITOR) 20 MG tablet Take 1 tablet (20 mg total) by mouth daily.  . colchicine 0.6 MG tablet Take 1 tablet (0.6 mg total) by mouth daily as needed.  [name] ibuprofen (ADVIL,MOTRIN) 200 MG tablet Take 200 mg by mouth every 6 (six) hours as needed for pain.  [name] lisinopril (PRINIVIL,ZESTRIL) 40 MG tablet take 1 tablet by mouth once daily  . metoprolol succinate (TOPROL-XL) 50 MG 24 hr tablet take 1 tablet by mouth once daily WITH OR IMMEDIATELY FOLLOWING A MEAL  . MOVIPREP 100 G SOLR Take 1 kit (200 g total) by mouth once.  [name] omeprazole (PRILOSEC OTC) 20 MG tablet Take 1 tablet (20 mg total) by mouth daily.  . ursodiol (ACTIGALL) 300 MG capsule Take 2 capsules by mouth twice a day    Allergies  Allergen Reactions  . Allopurinol Rash    Past Medical History  Diagnosis Date  . Hyperlipidemia   . Hypertension   . CAD (coronary artery disease) 2008    stent  . Choledocholithiasis     recurrent/multiple ERCPs with  stone extraction and sphincterotomy   . Gout 2013    dx by crystal analysis  . GERD (gastroesophageal reflux disease)     ROS: Negative except as per HPI  BP 134/78  Pulse 59  Ht $R'5\' 11"'OL$  (1.803 m)  Wt 194 lb (87.998 kg)  BMI 27.07 kg/m2  PHYSICAL EXAM: Pt is alert and oriented, NAD HEENT: normal Neck: JVP - normal, carotids 2+= without bruits Lungs: CTA bilaterally CV: RRR without murmur or gallop Abd: soft, NT, Positive BS, no hepatomegaly Ext: no C/C/E, distal pulses intact and equal Skin: warm/dry no rash  EKG: Sinus rhythm 59 beats per minute, possible age indeterminate inferior infarct. Otherwise within normal limits.  ASSESSMENT AND PLAN: 1. Coronary artery disease, native vessel. The patient is stable without symptoms of angina. I reviewed his cardiac cath studies from 2008. He is doing well off of Plavix. He should continue on long-term aspirin. He is on appropriate risk reduction medications.  2. Hypertension. Blood pressure is well controlled on a combination of amlodipine, lisinopril, and metoprolol.  3. Hyperlipidemia. Continue atorvastatin 20 mg daily.  I will see him back in one year for followup.  Sherren Mocha 06/23/2013 10:36 AM

## 2013-06-23 NOTE — Patient Instructions (Signed)
Your physician recommends that you continue on your current medications as directed. Please refer to the Current Medication list given to you today.  Your physician wants you to follow-up in: 1 year with Dr. Cooper.  You will receive a reminder letter in the mail two months in advance. If you don't receive a letter, please call our office to schedule the follow-up appointment.   

## 2013-06-24 ENCOUNTER — Encounter: Payer: Self-pay | Admitting: Internal Medicine

## 2013-06-28 HISTORY — PX: COLONOSCOPY: SHX174

## 2013-07-16 ENCOUNTER — Ambulatory Visit (AMBULATORY_SURGERY_CENTER): Payer: Medicare PPO | Admitting: Internal Medicine

## 2013-07-16 ENCOUNTER — Encounter: Payer: Self-pay | Admitting: Internal Medicine

## 2013-07-16 VITALS — BP 144/65 | HR 50 | Temp 97.0°F | Resp 18 | Ht 71.0 in | Wt 194.0 lb

## 2013-07-16 DIAGNOSIS — D126 Benign neoplasm of colon, unspecified: Secondary | ICD-10-CM

## 2013-07-16 DIAGNOSIS — Z1211 Encounter for screening for malignant neoplasm of colon: Secondary | ICD-10-CM

## 2013-07-16 MED ORDER — SODIUM CHLORIDE 0.9 % IV SOLN: 500.0000 mL | INTRAVENOUS | Status: DC

## 2013-07-16 NOTE — Progress Notes (Signed)
No egg or soy allergy. emw No problems with past sedation. emw

## 2013-07-16 NOTE — Progress Notes (Signed)
Report to pacu rn, vss, bbs=clear 

## 2013-07-16 NOTE — Op Note (Signed)
[place]  [address] [place], [address]   COLONOSCOPY PROCEDURE REPORT  PATIENT: Christian Kim, Christian Kim  MR#: [id number] BIRTHDATE: June 14, 1945 , 68  yrs. old GENDER: Male ENDOSCOPIST: Eustace Quail, MD REFERRED AO:[id number] Recall PROCEDURE DATE:  07/16/2013 PROCEDURE:   Colonoscopy with snare polypectomy x 1 First Screening Colonoscopy - Avg.  risk and is 50 yrs.  old or older Yes.  Prior Negative Screening - Now for repeat screening. N/A  History of Adenoma - Now for follow-up colonoscopy & has been > or = to 3 yrs.  N/A  Polyps Removed Today? Yes. ASA CLASS:   Class II INDICATIONS:average risk screening.   normal index screening colonoscopy October 2003 MEDICATIONS: MAC sedation, administered by CRNA and propofol (Diprivan) 220mg  IV  DESCRIPTION OF PROCEDURE:   After the risks benefits and alternatives of the procedure were thoroughly explained, informed consent was obtained.  A digital rectal exam revealed no abnormalities of the rectum.   The LB [id number]  endoscope was introduced through the anus and advanced to the cecum, which was identified by both the appendix and ileocecal valve. No adverse events experienced.   The quality of the prep was excellent, using MoviPrep  The instrument was then slowly withdrawn as the colon was fully examined.    COLON FINDINGS: A diminutive polyp was found at the cecum.  A polypectomy was performed with a cold snare.  The resection was complete and the polyp tissue was completely retrieved.   The colon mucosa was otherwise normal.  Retroflexed views revealed internal hemorrhoids. The time to cecum=2 minutes 02 seconds.  Withdrawal time=10 minutes 35 seconds.  The scope was withdrawn and the procedure completed. COMPLICATIONS: There were no complications.  ENDOSCOPIC IMPRESSION: 1.   Diminutive polyp was found at the cecum; polypectomy was performed with a cold snare 2.   The colon mucosa was otherwise  normal  RECOMMENDATIONS: 1. Repeat colonoscopy in 5 years if polyp adenomatous; otherwise 10 years   eSigned:  Eustace Quail, MD 07/16/2013 10:15 AM   cc: Ria Bush MD and The Patient

## 2013-07-16 NOTE — Progress Notes (Signed)
Called to room to assist during endoscopic procedure.  Patient ID and intended procedure confirmed with present staff. Received instructions for my participation in the procedure from the performing physician.  

## 2013-07-16 NOTE — Patient Instructions (Signed)
YOU HAD AN ENDOSCOPIC PROCEDURE TODAY AT THE [facility]: Refer to the procedure report that was given to you for any specific questions about what was found during the examination.  If the procedure report does not answer your questions, please call your gastroenterologist to clarify.  If you requested that your care partner not be given the details of your procedure findings, then the procedure report has been included in a sealed envelope for you to review at your convenience later.  YOU SHOULD EXPECT: Some feelings of bloating in the abdomen. Passage of more gas than usual.  Walking can help get rid of the air that was put into your GI tract during the procedure and reduce the bloating. If you had a lower endoscopy (such as a colonoscopy or flexible sigmoidoscopy) you may notice spotting of blood in your stool or on the toilet paper. If you underwent a bowel prep for your procedure, then you may not have a normal bowel movement for a few days.  DIET: Your first meal following the procedure should be a light meal and then it is ok to progress to your normal diet.  A half-sandwich or bowl of soup is an example of a good first meal.  Heavy or fried foods are harder to digest and may make you feel nauseous or bloated.  Likewise meals heavy in dairy and vegetables can cause extra gas to form and this can also increase the bloating.  Drink plenty of fluids but you should avoid alcoholic beverages for 24 hours.  ACTIVITY: Your care partner should take you home directly after the procedure.  You should plan to take it easy, moving slowly for the rest of the day.  You can resume normal activity the day after the procedure however you should NOT DRIVE or use heavy machinery for 24 hours (because of the sedation medicines used during the test).    SYMPTOMS TO REPORT IMMEDIATELY: A gastroenterologist can be reached at any hour.  During normal business hours, 8:30 AM to 5:00 PM Monday through Friday,  call [phone].  After hours and on weekends, please call the GI answering service at [phone] who will take a message and have the physician on call contact you.   Following lower endoscopy (colonoscopy or flexible sigmoidoscopy):  Excessive amounts of blood in the stool  Significant tenderness or worsening of abdominal pains  Swelling of the abdomen that is new, acute  Fever of 100F or higher   FOLLOW UP: If any biopsies were taken you will be contacted by phone or by letter within the next 1-3 weeks.  Call your gastroenterologist if you have not heard about the biopsies in 3 weeks.  Our staff will call the home number listed on your records the next business day following your procedure to check on you and address any questions or concerns that you may have at that time regarding the information given to you following your procedure. This is a courtesy call and so if there is no answer at the home number and we have not heard from you through the emergency physician on call, we will assume that you have returned to your regular daily activities without incident.  SIGNATURES/CONFIDENTIALITY: You and/or your care partner have signed paperwork which will be entered into your electronic medical record.  These signatures attest to the fact that that the information above on your After Visit Summary has been reviewed and is understood.  Full responsibility of the confidentiality of   this discharge information lies with you and/or your care-partner.  Polyp-handout given  Repeat colonoscopy will be determined by pathology   

## 2013-07-17 ENCOUNTER — Telehealth: Payer: Self-pay | Admitting: *Deleted

## 2013-07-17 NOTE — Telephone Encounter (Signed)
  Follow up Call-  Call back number 07/16/2013  Post procedure Call Back phone  # [phone]  Permission to leave phone message Yes     Patient questions:  Do you have a fever, pain , or abdominal swelling? no Pain Score  0 *  Have you tolerated food without any problems? yes  Have you been able to return to your normal activities? yes  Do you have any questions about your discharge instructions: Diet   no Medications  no Follow up visit  no  Do you have questions or concerns about your Care? no  Actions: * If pain score is 4 or above: No action needed, pain <4.

## 2013-07-21 ENCOUNTER — Encounter: Payer: Self-pay | Admitting: Internal Medicine

## 2013-07-23 ENCOUNTER — Encounter: Payer: Self-pay | Admitting: Family Medicine

## 2013-08-17 ENCOUNTER — Other Ambulatory Visit: Payer: Self-pay | Admitting: Internal Medicine

## 2013-08-28 ENCOUNTER — Other Ambulatory Visit: Payer: Self-pay

## 2013-08-28 MED ORDER — ATORVASTATIN CALCIUM 20 MG PO TABS
20.0000 mg | ORAL_TABLET | Freq: Every day | ORAL | Status: DC
Start: 2013-08-28 — End: 2014-06-07

## 2013-09-08 ENCOUNTER — Telehealth: Payer: Self-pay | Admitting: Internal Medicine

## 2013-09-08 DIAGNOSIS — K8050 Calculus of bile duct without cholangitis or cholecystitis without obstruction: Secondary | ICD-10-CM

## 2013-09-08 DIAGNOSIS — K219 Gastro-esophageal reflux disease without esophagitis: Secondary | ICD-10-CM

## 2013-09-08 DIAGNOSIS — Z1211 Encounter for screening for malignant neoplasm of colon: Secondary | ICD-10-CM

## 2013-09-08 MED ORDER — URSODIOL 300 MG PO CAPS: ORAL_CAPSULE | ORAL | Status: DC

## 2013-09-08 NOTE — Telephone Encounter (Signed)
Refilled 90 day supply Actigall

## 2013-11-15 ENCOUNTER — Other Ambulatory Visit: Payer: Self-pay | Admitting: Internal Medicine

## 2014-04-14 ENCOUNTER — Ambulatory Visit (INDEPENDENT_AMBULATORY_CARE_PROVIDER_SITE_OTHER): Payer: Medicare PPO | Admitting: Family Medicine

## 2014-04-14 ENCOUNTER — Encounter: Payer: Self-pay | Admitting: Family Medicine

## 2014-04-14 VITALS — BP 136/74 | HR 64 | Temp 98.3°F | Wt 193.0 lb

## 2014-04-14 DIAGNOSIS — H6503 Acute serous otitis media, bilateral: Secondary | ICD-10-CM

## 2014-04-14 DIAGNOSIS — H6500 Acute serous otitis media, unspecified ear: Secondary | ICD-10-CM | POA: Insufficient documentation

## 2014-04-14 MED ORDER — FLUTICASONE PROPIONATE 50 MCG/ACT NA SUSP: 2.0000 | Freq: Every day | NASAL | Status: DC

## 2014-04-14 NOTE — Progress Notes (Signed)
BP 136/74 mmHg  Pulse 64  Temp(Src) 98.3 F (36.8 C) (Oral)  Wt 193 lb (87.544 kg)   CC: check ears  Subjective:    Patient ID: Christian Kim, male    DOB: 1946/02/20, 68 y.o.   MRN: [id number]  HPI: [name] is a 68 y.o. male presenting on 04/14/2014 for Ears stopped up   Bad cold a few weeks ago, ears clogged up for last week with trouble hearing. "Sounds like I'm talking in a drum". No ear pain or drainage. No nausea, dizziness/vertigo.  Still with residual cough but cold overall doing better.  Relevant past medical, surgical, family and social history reviewed and updated as indicated.  Allergies and medications reviewed and updated. Current Outpatient Prescriptions on File Prior to Visit  Medication Sig  . amLODipine (NORVASC) 10 MG tablet take 1 tablet by mouth once daily  . aspirin 81 MG tablet Take 81 mg by mouth daily.    [name] atorvastatin (LIPITOR) 20 MG tablet Take 1 tablet (20 mg total) by mouth daily.  . colchicine 0.6 MG tablet Take 1 tablet (0.6 mg total) by mouth daily as needed.  [name] ibuprofen (ADVIL,MOTRIN) 200 MG tablet Take 200 mg by mouth every 6 (six) hours as needed for pain.  [name] lisinopril (PRINIVIL,ZESTRIL) 40 MG tablet take 1 tablet by mouth once daily  . metoprolol succinate (TOPROL-XL) 50 MG 24 hr tablet take 1 tablet by mouth once daily WITH OR IMMEDIATELY FOLLOWING A MEAL  . omeprazole (PRILOSEC OTC) 20 MG tablet Take 1 tablet (20 mg total) by mouth daily.  . ursodiol (ACTIGALL) 300 MG capsule Take 2 capsules by mouth twice a day   No current facility-administered medications on file prior to visit.    Review of Systems Per HPI unless specifically indicated above    Objective:    BP 136/74 mmHg  Pulse 64  Temp(Src) 98.3 F (36.8 C) (Oral)  Wt 193 lb (87.544 kg)  Physical Exam  Constitutional: He appears well-developed and well-nourished. No distress.  HENT:  Head: Normocephalic and atraumatic.  Right Ear: Hearing, external ear and  ear canal normal.  Left Ear: Hearing, external ear and ear canal normal.  Nose: Nose normal. No mucosal edema or rhinorrhea. Right sinus exhibits no maxillary sinus tenderness and no frontal sinus tenderness. Left sinus exhibits no maxillary sinus tenderness and no frontal sinus tenderness.  Mouth/Throat: Uvula is midline, oropharynx is clear and moist and mucous membranes are normal. No oropharyngeal exudate, posterior oropharyngeal edema, posterior oropharyngeal erythema or tonsillar abscesses.  Retracted TMs bilaterally, yellow serous fluid behind R TM, mildly injected L TM  Eyes: Conjunctivae and EOM are normal. Pupils are equal, round, and reactive to light. No scleral icterus.  Neck: Normal range of motion. Neck supple.  Cardiovascular: Normal rate, regular rhythm, normal heart sounds and intact distal pulses.   No murmur heard. Pulmonary/Chest: Effort normal and breath sounds normal. No respiratory distress. He has no wheezes. He has no rales.  Lymphadenopathy:    He has no cervical adenopathy.  Skin: Skin is warm and dry. No rash noted.  Nursing note and vitals reviewed.      Assessment & Plan:   Problem List Items Addressed This Visit    Acute serous otitis media - Primary    After recent URI. Discussed this. No evidence of infection today. Supportive care, nasal saline, start nasal steroid as well. Update if not improved in next 2-3 wks for consideration of ENT referral. Discussed  red flags to suggest infection (fever, ear pain, worsening and prolonged head congestion)        Follow up plan: Return if symptoms worsen or fail to improve.

## 2014-04-14 NOTE — Patient Instructions (Signed)
You have fluid in your middle ears - after recent cold. This should get better with time. Use nasal saline irrigation and start nasal steroid (sent to pharmacy). If fever >101, ear pain or worsening head congestion let me know. If persistent muffled hearing into December, also let me know for referral to ENT.  Serous Otitis Media Serous otitis media is fluid in the middle ear space. This space contains the bones for hearing and air. Air in the middle ear space helps to transmit sound.  The air gets there through the eustachian tube. This tube goes from the back of the nose (nasopharynx) to the middle ear space. It keeps the pressure in the middle ear the same as the outside world. It also helps to drain fluid from the middle ear space. CAUSES  Serous otitis media occurs when the eustachian tube gets blocked. Blockage can come from:  Ear infections.  Colds and other upper respiratory infections.  Allergies.  Irritants such as cigarette smoke.  Sudden changes in air pressure (such as descending in an airplane).  Enlarged adenoids.  A mass in the nasopharynx. During colds and upper respiratory infections, the middle ear space can become temporarily filled with fluid. This can happen after an ear infection also. Once the infection clears, the fluid will generally drain out of the ear through the eustachian tube. If it does not, then serous otitis media occurs. SIGNS AND SYMPTOMS   Hearing loss.  A feeling of fullness in the ear, without pain.  Young children may not show any symptoms but may show slight behavioral changes, such as agitation, ear pulling, or crying. DIAGNOSIS  Serous otitis media is diagnosed by an ear exam. Tests may be done to check on the movement of the eardrum. Hearing exams may also be done. TREATMENT  The fluid most often goes away without treatment. If allergy is the cause, allergy treatment may be helpful. Fluid that persists for several months may require  minor surgery. A small tube is placed in the eardrum to:  Drain the fluid.  Restore the air in the middle ear space. In certain situations, antibiotic medicines are used to avoid surgery. Surgery may be done to remove enlarged adenoids (if this is the cause). HOME CARE INSTRUCTIONS   Keep children away from tobacco smoke.  Keep all follow-up visits as directed by your health care provider. SEEK MEDICAL CARE IF:   Your hearing is not better in 3 months.  Your hearing is worse.  You have ear pain.  You have drainage from the ear.  You have dizziness.  You have serous otitis media only in one ear or have any bleeding from your nose (epistaxis).  You notice a lump on your neck. MAKE SURE YOU:  Understand these instructions.   Will watch your condition.   Will get help right away if you are not doing well or get worse.  Document Released: 08/04/2003 Document Revised: 09/28/2013 Document Reviewed: 12/09/2012 [facility] Patient Information 2015 [place], [place]. This information is not intended to replace advice given to you by your health care provider. Make sure you discuss any questions you have with your health care provider.

## 2014-04-14 NOTE — Assessment & Plan Note (Signed)
After recent URI. Discussed this. No evidence of infection today. Supportive care, nasal saline, start nasal steroid as well. Update if not improved in next 2-3 wks for consideration of ENT referral. Discussed red flags to suggest infection (fever, ear pain, worsening and prolonged head congestion)

## 2014-04-14 NOTE — Progress Notes (Signed)
Pre visit review using our clinic review tool, if applicable. No additional management support is needed unless otherwise documented below in the visit note. 

## 2014-05-24 ENCOUNTER — Telehealth: Payer: Self-pay

## 2014-05-24 NOTE — Telephone Encounter (Signed)
error 

## 2014-05-25 ENCOUNTER — Other Ambulatory Visit: Payer: Self-pay | Admitting: *Deleted

## 2014-05-25 MED ORDER — METOPROLOL SUCCINATE ER 50 MG PO TB24: ORAL_TABLET | ORAL | Status: DC

## 2014-05-29 ENCOUNTER — Other Ambulatory Visit: Payer: Self-pay | Admitting: Family Medicine

## 2014-05-29 DIAGNOSIS — E785 Hyperlipidemia, unspecified: Secondary | ICD-10-CM

## 2014-05-29 DIAGNOSIS — I10 Essential (primary) hypertension: Secondary | ICD-10-CM

## 2014-05-29 DIAGNOSIS — M109 Gout, unspecified: Secondary | ICD-10-CM

## 2014-05-29 DIAGNOSIS — N289 Disorder of kidney and ureter, unspecified: Secondary | ICD-10-CM

## 2014-05-29 DIAGNOSIS — R972 Elevated prostate specific antigen [PSA]: Secondary | ICD-10-CM

## 2014-05-31 ENCOUNTER — Other Ambulatory Visit (INDEPENDENT_AMBULATORY_CARE_PROVIDER_SITE_OTHER): Payer: Medicare PPO

## 2014-05-31 DIAGNOSIS — R972 Elevated prostate specific antigen [PSA]: Secondary | ICD-10-CM

## 2014-05-31 DIAGNOSIS — M109 Gout, unspecified: Secondary | ICD-10-CM

## 2014-05-31 DIAGNOSIS — I10 Essential (primary) hypertension: Secondary | ICD-10-CM

## 2014-05-31 DIAGNOSIS — N289 Disorder of kidney and ureter, unspecified: Secondary | ICD-10-CM

## 2014-05-31 DIAGNOSIS — E785 Hyperlipidemia, unspecified: Secondary | ICD-10-CM

## 2014-05-31 LAB — BASIC METABOLIC PANEL
BUN: 16 mg/dL (ref 6–23)
CALCIUM: 9.3 mg/dL (ref 8.4–10.5)
CHLORIDE: 104 meq/L (ref 96–112)
CO2: 30 mEq/L (ref 19–32)
Creatinine, Ser: 1.3 mg/dL (ref 0.4–1.5)
GFR: 60.87 mL/min (ref >60.00)
Glucose, Bld: 102 mg/dL — ABNORMAL HIGH (ref 70–99)
Potassium: 4 mEq/L (ref 3.5–5.1)
SODIUM: 143 meq/L (ref 135–145)

## 2014-05-31 LAB — LIPID PANEL
CHOLESTEROL: 132 mg/dL (ref 0–200)
HDL: 32.4 mg/dL — AB (ref >39.00)
NonHDL: 99.6
Total CHOL/HDL Ratio: 4
Triglycerides: 278 mg/dL — ABNORMAL HIGH (ref 0.0–149.0)
VLDL: 55.6 mg/dL — AB (ref 0.0–40.0)

## 2014-05-31 LAB — PSA: PSA: 1.28 ng/mL (ref 0.10–4.00)

## 2014-05-31 LAB — URIC ACID: Uric Acid, Serum: 6.6 mg/dL (ref 4.0–7.8)

## 2014-05-31 LAB — LDL CHOLESTEROL, DIRECT: Direct LDL: 63.6 mg/dL

## 2014-06-01 ENCOUNTER — Telehealth: Payer: Self-pay

## 2014-06-01 DIAGNOSIS — K219 Gastro-esophageal reflux disease without esophagitis: Secondary | ICD-10-CM

## 2014-06-01 MED ORDER — OMEPRAZOLE MAGNESIUM 20 MG PO TBEC: 20.0000 mg | DELAYED_RELEASE_TABLET | Freq: Every day | ORAL | Status: DC

## 2014-06-01 NOTE — Telephone Encounter (Signed)
Refilled Prilosec

## 2014-06-07 ENCOUNTER — Ambulatory Visit (INDEPENDENT_AMBULATORY_CARE_PROVIDER_SITE_OTHER): Payer: Medicare PPO | Admitting: Family Medicine

## 2014-06-07 ENCOUNTER — Encounter: Payer: Self-pay | Admitting: Family Medicine

## 2014-06-07 VITALS — BP 142/82 | HR 60 | Temp 98.1°F | Ht 70.0 in | Wt 195.5 lb

## 2014-06-07 DIAGNOSIS — Z0000 Encounter for general adult medical examination without abnormal findings: Secondary | ICD-10-CM | POA: Insufficient documentation

## 2014-06-07 DIAGNOSIS — Z7189 Other specified counseling: Secondary | ICD-10-CM | POA: Insufficient documentation

## 2014-06-07 DIAGNOSIS — I2510 Atherosclerotic heart disease of native coronary artery without angina pectoris: Secondary | ICD-10-CM

## 2014-06-07 DIAGNOSIS — B356 Tinea cruris: Secondary | ICD-10-CM | POA: Insufficient documentation

## 2014-06-07 DIAGNOSIS — E785 Hyperlipidemia, unspecified: Secondary | ICD-10-CM

## 2014-06-07 DIAGNOSIS — K219 Gastro-esophageal reflux disease without esophagitis: Secondary | ICD-10-CM

## 2014-06-07 DIAGNOSIS — I10 Essential (primary) hypertension: Secondary | ICD-10-CM

## 2014-06-07 DIAGNOSIS — Z23 Encounter for immunization: Secondary | ICD-10-CM

## 2014-06-07 DIAGNOSIS — N289 Disorder of kidney and ureter, unspecified: Secondary | ICD-10-CM

## 2014-06-07 DIAGNOSIS — Z8701 Personal history of pneumonia (recurrent): Secondary | ICD-10-CM

## 2014-06-07 DIAGNOSIS — R972 Elevated prostate specific antigen [PSA]: Secondary | ICD-10-CM

## 2014-06-07 MED ORDER — METOPROLOL SUCCINATE ER 50 MG PO TB24: ORAL_TABLET | ORAL | Status: DC

## 2014-06-07 MED ORDER — LISINOPRIL 40 MG PO TABS: 40.0000 mg | ORAL_TABLET | Freq: Every day | ORAL | Status: DC

## 2014-06-07 MED ORDER — COLCHICINE 0.6 MG PO TABS: 0.6000 mg | ORAL_TABLET | Freq: Every day | ORAL | Status: DC | PRN

## 2014-06-07 MED ORDER — OMEPRAZOLE MAGNESIUM 20 MG PO TBEC: 20.0000 mg | DELAYED_RELEASE_TABLET | Freq: Every day | ORAL | Status: DC

## 2014-06-07 MED ORDER — ATORVASTATIN CALCIUM 20 MG PO TABS: 20.0000 mg | ORAL_TABLET | Freq: Every day | ORAL | Status: DC

## 2014-06-07 MED ORDER — NAFTIFINE HCL 1 % EX CREA: TOPICAL_CREAM | Freq: Every day | CUTANEOUS | Status: DC

## 2014-06-07 MED ORDER — AMLODIPINE BESYLATE 10 MG PO TABS: 10.0000 mg | ORAL_TABLET | Freq: Every day | ORAL | Status: DC

## 2014-06-07 NOTE — Progress Notes (Signed)
BP 142/82 mmHg  Pulse 60  Temp(Src) 98.1 F (36.7 C) (Oral)  Ht 5\' 10"  (1.778 m)  Wt 195 lb 8 oz (88.678 kg)  BMI 28.05 kg/m2   CC: medicare wellness visit  Subjective:    Patient ID: Christian Kim, male    DOB: Aug 01, 1945, 69 y.o.   MRN: [id number]  HPI: Christian Kim is a 69 y.o. male presenting on 06/07/2014 for Annual Exam   Chronic ursodiol use 2/2 recurrent gallstones after cholecystectomy.  Passes hearing and vision screens. Denies depression/anhedonia, sadness. No falls in the last year.  Preventative: COLONOSCOPY Date: 06/2013 1 polyp, rpt 5 yrs [name]) Prostate cancer screening - has had routinely checked but not in last few years. Denies nocturia or LUTS sxs.  Flu shot 02/2013 Pneumovax 2012, prevnar today. Td 2003 - will defer this Zostavax 2012  Advanced directives: requests packet today. Would want wife to be HCPOA.   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 Interim medical history since our last visit reviewed. Allergies and medications reviewed and updated. Current Outpatient Prescriptions on File Prior to Visit  Medication Sig  . aspirin 81 MG tablet Take 81 mg by mouth daily.    . ursodiol (ACTIGALL) 300 MG capsule Take 2 capsules by mouth twice a day  . ibuprofen (ADVIL,MOTRIN) 200 MG tablet Take 200 mg by mouth every 6 (six) hours as needed for pain.   No current facility-administered medications on file prior to visit.    Review of Systems  Constitutional: Negative for fever, chills, activity change, appetite change, fatigue and unexpected weight change.  HENT: Negative for hearing loss.   Eyes: Negative for visual disturbance.  Respiratory: Negative for cough,  chest tightness, shortness of breath and wheezing.   Cardiovascular: Negative for chest pain, palpitations and leg swelling.  Gastrointestinal: Negative for nausea, vomiting, abdominal pain, diarrhea, constipation, blood in stool and abdominal distention.  Genitourinary: Negative for hematuria and difficulty urinating.  Musculoskeletal: Negative for myalgias, arthralgias and neck pain.  Skin: Negative for rash.  Neurological: Negative for dizziness, seizures, syncope and headaches.  Hematological: Negative for adenopathy. Does not bruise/bleed easily.  Psychiatric/Behavioral: Negative for dysphoric mood. The patient is not nervous/anxious.    Per HPI unless specifically indicated above     Objective:    BP 142/82 mmHg  Pulse 60  Temp(Src) 98.1 F (36.7 C) (Oral)  Ht 5\' 10"  (1.778 m)  Wt 195 lb 8 oz (88.678 kg)  BMI 28.05 kg/m2  Wt Readings from Last 3 Encounters:  06/07/14 195 lb 8 oz (88.678 kg)  04/14/14 193 lb (87.544 kg)  07/16/13 194 lb (87.998 kg)    Physical Exam  Constitutional: He is oriented to person, place, and time. He appears well-developed and well-nourished. No distress.  HENT:  Head: Normocephalic and atraumatic.  Right Ear: Hearing, tympanic membrane, external ear and ear canal normal.  Left Ear: Hearing, tympanic membrane, external ear and ear canal normal.  Nose: Nose normal.  Mouth/Throat: Uvula is midline, oropharynx is clear and moist and mucous membranes are normal. No oropharyngeal exudate, posterior oropharyngeal edema or posterior oropharyngeal erythema.  Eyes: Conjunctivae and EOM are normal. Pupils are equal, round, and reactive to light. No scleral icterus.  Neck: Normal range of  motion. Neck supple. Carotid bruit is not present. No thyromegaly present.  Cardiovascular: Normal rate, regular rhythm, normal heart sounds and intact distal pulses.   No murmur heard. Pulses:      Radial pulses are 2+ on the right side, and 2+ on the left side.    Pulmonary/Chest: Effort normal and breath sounds normal. No respiratory distress. He has no wheezes. He has no rales.  Abdominal: Soft. Bowel sounds are normal. He exhibits no distension and no mass. There is no tenderness. There is no rebound and no guarding.  Genitourinary: Rectum normal and prostate normal. Rectal exam shows no external hemorrhoid, no internal hemorrhoid, no fissure, no mass, no tenderness and anal tone normal. Prostate is not enlarged and not tender.  Firm prostate throughout but no induration noted, unchanged  Musculoskeletal: Normal range of motion. He exhibits no edema.  Lymphadenopathy:    He has no cervical adenopathy.  Neurological: He is alert and oriented to person, place, and time.  CN grossly intact, station and gait intact Recall 3/3  Calculation 5/5 serial 3s  Skin: Skin is warm and dry. Rash noted.  Hyperpigmented macular slightly erythematous rash groin region with scrotal involvement.  Psychiatric: He has a normal mood and affect. His behavior is normal. Judgment and thought content normal.  Nursing note and vitals reviewed.  Results for orders placed or performed in visit on 05/31/14  Lipid panel  Result Value Ref Range   Cholesterol 132 0 - 200 mg/dL   Triglycerides 278.0 (H) 0.0 - 149.0 mg/dL   HDL 32.40 (L) >39.00 mg/dL   VLDL 55.6 (H) 0.0 - 40.0 mg/dL   Total CHOL/HDL Ratio 4    NonHDL 36.64   Basic metabolic panel  Result Value Ref Range   Sodium 143 135 - 145 mEq/L   Potassium 4.0 3.5 - 5.1 mEq/L   Chloride 104 96 - 112 mEq/L   CO2 30 19 - 32 mEq/L   Glucose, Bld 102 (H) 70 - 99 mg/dL   BUN 16 6 - 23 mg/dL   Creatinine, Ser 1.3 0.4 - 1.5 mg/dL   Calcium 9.3 8.4 - 10.5 mg/dL   GFR 60.87 >60.00 mL/min  PSA  Result Value Ref Range   PSA 1.28 0.10 - 4.00 ng/mL  Uric acid  Result Value Ref Range   Uric Acid, Serum 6.6 4.0 - 7.8 mg/dL  LDL cholesterol, direct  Result Value Ref Range   Direct LDL 63.6 mg/dL      Assessment & Plan:    Problem List Items Addressed This Visit    Tinea cruris    Resistant to several OTC remedies - will try naftin cream x 2-4 weeks, if not improved consider oral diflucan course.    Relevant Medications      naftifine (NAFTIN) 1 % cream   Renal insufficiency    Continued improvement noted.    Medicare annual wellness visit, subsequent - Primary    I have personally reviewed the Medicare Annual Wellness questionnaire and have noted 1. The patient's medical and social history 2. Their use of alcohol, tobacco or illicit drugs 3. Their current medications and supplements 4. The patient's functional ability including ADL's, fall risks, home safety risks and hearing or visual impairment. 5. Diet and physical activity 6. Evidence for depression or mood disorders The patients weight, height, BMI have been recorded in the chart.  Hearing and vision has been addressed. I have made referrals, counseling and provided education to the patient based review of  the above and I have provided the pt with a written personalized care plan for preventive services. Provider list updated - see scanned questionairre. Reviewed preventative protocols and updated unless pt declined.     Increased prostate specific antigen (PSA) velocity    Level back to normal. Continue to monitor yearly with DRE/PSA.    HLD (hyperlipidemia)    Reviewed diet/lifestyle choices to improve trig/HDL levels. LDL at goal.    Relevant Medications      amLODIpine (NORVASC) tablet      atorvastatin (LIPITOR) tablet      lisinopril (PRINIVIL,ZESTRIL) tablet      metoprolol succinate (TOPROL-XL) 24 hr tablet   History of pneumonia   Health maintenance examination    Preventative protocols reviewed and updated unless pt declined. Discussed healthy diet and lifestyle.     Essential hypertension    Chronic, stable. Continue regimen.    Relevant Medications      amLODIpine (NORVASC) tablet      atorvastatin (LIPITOR) tablet       lisinopril (PRINIVIL,ZESTRIL) tablet      metoprolol succinate (TOPROL-XL) 24 hr tablet   Coronary atherosclerosis    Stable, asxs.    Relevant Medications      amLODIpine (NORVASC) tablet      atorvastatin (LIPITOR) tablet      lisinopril (PRINIVIL,ZESTRIL) tablet      metoprolol succinate (TOPROL-XL) 24 hr tablet   Advanced care planning/counseling discussion    Advanced directives: requests packet today. Would want wife to be HCPOA.      Other Visit Diagnoses    Gastroesophageal reflux disease without esophagitis        Relevant Medications       omeprazole (PRILOSEC OTC) EC tablet 20 mg        Follow up plan: Return in about 1 year (around 06/08/2015), or as needed, for medicare wellness.

## 2014-06-07 NOTE — Assessment & Plan Note (Signed)
Level back to normal. Continue to monitor yearly with DRE/PSA.

## 2014-06-07 NOTE — Assessment & Plan Note (Addendum)
Resistant to several OTC remedies - will try naftin cream x 2-4 weeks, if not improved consider oral diflucan course.

## 2014-06-07 NOTE — Progress Notes (Signed)
Pre visit review using our clinic review tool, if applicable. No additional management support is needed unless otherwise documented below in the visit note. 

## 2014-06-07 NOTE — Assessment & Plan Note (Signed)
Preventative protocols reviewed and updated unless pt declined. Discussed healthy diet and lifestyle.  

## 2014-06-07 NOTE — Patient Instructions (Addendum)
prevnar today.  Advanced directive packet provided today. Good to see you today.  Return as needed or in 1 year for next physical. For jock itch - try naftin cream twice daily for next 2-4 weeks. Update if not improving with this.

## 2014-06-07 NOTE — Addendum Note (Signed)
Addended by: Royann Shivers A on: 06/07/2014 12:33 PM   Modules accepted: Orders

## 2014-06-07 NOTE — Assessment & Plan Note (Signed)
Advanced directives: requests packet today. Would want wife to be HCPOA.

## 2014-06-07 NOTE — Assessment & Plan Note (Signed)
Reviewed diet/lifestyle choices to improve trig/HDL levels. LDL at goal.

## 2014-06-07 NOTE — Assessment & Plan Note (Signed)
Continued improvement noted.

## 2014-06-07 NOTE — Assessment & Plan Note (Signed)

## 2014-06-07 NOTE — Assessment & Plan Note (Signed)
Chronic, stable. Continue regimen. 

## 2014-06-07 NOTE — Assessment & Plan Note (Signed)
Stable, asxs.

## 2014-06-08 ENCOUNTER — Telehealth: Payer: Self-pay | Admitting: Family Medicine

## 2014-06-08 NOTE — Telephone Encounter (Signed)
emmi mailed  °

## 2014-06-23 ENCOUNTER — Encounter: Payer: Self-pay | Admitting: Cardiovascular Disease

## 2014-06-23 ENCOUNTER — Ambulatory Visit (INDEPENDENT_AMBULATORY_CARE_PROVIDER_SITE_OTHER): Payer: Medicare PPO | Admitting: Cardiovascular Disease

## 2014-06-23 VITALS — BP 122/62 | HR 61 | Ht 70.0 in | Wt 193.0 lb

## 2014-06-23 DIAGNOSIS — I2510 Atherosclerotic heart disease of native coronary artery without angina pectoris: Secondary | ICD-10-CM

## 2014-06-23 DIAGNOSIS — I10 Essential (primary) hypertension: Secondary | ICD-10-CM

## 2014-06-23 NOTE — Patient Instructions (Signed)
Your physician wants you to follow-up in: 1 YEAR with Dr Cooper.  You will receive a reminder letter in the mail two months in advance. If you don't receive a letter, please call our office to schedule the follow-up appointment.  Your physician recommends that you continue on your current medications as directed. Please refer to the Current Medication list given to you today.  

## 2014-06-23 NOTE — Progress Notes (Signed)
Cardiology Office Note   Date:  06/23/2014   ID:  Christian Kim, DOB 1945/09/11, MRN [id number]  PCP:  Ria Bush, MD  Cardiologist:  Sherren Mocha, MD    Chief Complaint  Patient presents with  . Coronary artery disease     History of Present Illness: Christian Kim is a 69 y.o. male who presents for follow-up of coronary artery disease. He initially presented in 2008 with a non-ST elevation infarct. At that time he underwent complex PCI the left circumflex and a long area of total occlusion was treated with overlapping drug-eluting stents. He then underwent staged PCI of the right coronary artery during that same hospital admission. He was noted to have moderate LAD stenosis and this is been managed medically.  He's doing well. 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 No chest pain, shortness of breath, leg swelling. Reports no medication changes since I last saw him.   Past Medical History  Diagnosis Date  . Hyperlipidemia   . Hypertension   . CAD (coronary artery disease) 2008    stent  . Choledocholithiasis     recurrent/multiple ERCPs with stone extraction and sphincterotomy   . Gout 2013    dx by crystal analysis  . GERD (gastroesophageal reflux disease)   . Myocardial infarction 12-2006    Past Surgical History  Procedure Laterality Date  . Cholecystectomy  2006    w/ revision 2/2 persistent gallstones  . Nasal septum surgery    . Coronary angioplasty with stent placement  2008    stent, Dr. Hyman Hopes  . Colonoscopy  06/2013    1 polyp, rpt 5 yrs [name])    Current Outpatient Prescriptions  Medication Sig Dispense Refill  . amLODipine (NORVASC) 10 MG tablet Take 1 tablet (10 mg total) by mouth daily. 90 tablet 3  . aspirin 81 MG tablet Take 81 mg by mouth daily.      [name] atorvastatin (LIPITOR) 20 MG tablet Take 1 tablet (20 mg total) by mouth daily. 90 tablet 3  . colchicine 0.6 MG tablet Take 1 tablet (0.6 mg total) by mouth daily as  needed. 30 tablet 3  . ibuprofen (ADVIL,MOTRIN) 200 MG tablet Take 200 mg by mouth every 6 (six) hours as needed for pain.    [name] lisinopril (PRINIVIL,ZESTRIL) 40 MG tablet Take 1 tablet (40 mg total) by mouth daily. 90 tablet 3  . metoprolol succinate (TOPROL-XL) 50 MG 24 hr tablet take 1 tablet by mouth once daily WITH OR IMMEDIATELY FOLLOWING A MEAL 90 tablet 3  . naftifine (NAFTIN) 1 % cream Apply topically daily. 60 g 0  . omeprazole (PRILOSEC OTC) 20 MG tablet Take 1 tablet (20 mg total) by mouth daily. 90 tablet 3  . ursodiol (ACTIGALL) 300 MG capsule Take 2 capsules by mouth twice a day 360 capsule 2   No current facility-administered medications for this visit.    Allergies:   Allopurinol   Social History:  The patient  reports that he has never smoked. He has never used smokeless tobacco. He reports that he drinks alcohol. He reports that he does not use illicit drugs.   Family History:  The patient's  family history includes Liver cancer in his mother; Lung cancer in his mother. There is no history of Colon cancer, Esophageal cancer, Stomach cancer, or Rectal cancer.    ROS:  Please see the history of present illness, otherwise negative.   PHYSICAL EXAM: VS:  BP 122/62 mmHg  Pulse 61  Ht 5\' 10"  (1.778 m)  Wt 193 lb (87.544 kg)  BMI 27.69 kg/m2 , BMI Body mass index is 27.69 kg/(m^2). GEN: Well nourished, well developed, in no acute distress HEENT: normal Neck: no JVD, carotid bruits, or masses Cardiac: RRR; no murmurs, rubs, or gallops,no edema  Respiratory:  clear to auscultation bilaterally, normal work of breathing GI: soft, nontender, nondistended, + BS MS: no deformity or atrophy Skin: warm and dry, no rash Neuro:  Strength and sensation are intact Psych: euthymic mood, full affect  EKG:  EKG is ordered today. The ekg ordered today shows normal sinus rhythm 61 bpm, possible age-indeterminate inferior infarct  Recent Labs: 05/31/2014: BUN 16; Creatinine 1.3;  Potassium 4.0; Sodium 143   Lipid Panel     Component Value Date/Time   CHOL 132 05/31/2014 0811   TRIG 278.0* 05/31/2014 0811   HDL 32.40* 05/31/2014 0811   CHOLHDL 4 05/31/2014 0811   VLDL 55.6* 05/31/2014 0811   LDLCALC 36 09/16/2008 0813   LDLDIRECT 63.6 05/31/2014 0811      Wt Readings from Last 3 Encounters:  06/23/14 193 lb (87.544 kg)  06/07/14 195 lb 8 oz (88.678 kg)  04/14/14 193 lb (87.544 kg)     ASSESSMENT AND PLAN: 1.  CAD, native vessel: The patient is stable without symptoms of angina. I reviewed his medication list and his medicines will be continued without change.  2. Essential hypertension: Blood pressure is under good control on his current medical program.  3. Hyperlipidemia, managed with atorvastatin. Most recent lipids and LFTs reviewed. He is in range. We discussed his low HDL cholesterol and hypertriglyceridemia, and reviewed the importance of lifestyle modification as part of his treatment plan.   Current medicines are reviewed with the patient today.  The patient does not have concerns regarding medicines.  The following changes have been made:  no change  Disposition:   FU with me in one year  Signed, Sherren Mocha, MD  06/23/2014 10:28 PM    [facility] [place], [place], [place]  [address] Phone: [phone]; Fax: [phone]

## 2014-11-09 ENCOUNTER — Encounter: Payer: Self-pay | Admitting: Gastroenterology

## 2014-11-27 ENCOUNTER — Other Ambulatory Visit: Payer: Self-pay | Admitting: Internal Medicine

## 2015-03-07 ENCOUNTER — Ambulatory Visit (INDEPENDENT_AMBULATORY_CARE_PROVIDER_SITE_OTHER): Payer: Medicare PPO | Admitting: Family Medicine

## 2015-03-07 ENCOUNTER — Ambulatory Visit (INDEPENDENT_AMBULATORY_CARE_PROVIDER_SITE_OTHER)
Admission: RE | Admit: 2015-03-07 | Discharge: 2015-03-07 | Disposition: A | Discharge status: Home / Self Care | Payer: Medicare PPO | Source: Ambulatory Visit | Attending: Family Medicine | Admitting: Family Medicine

## 2015-03-07 ENCOUNTER — Encounter: Payer: Self-pay | Admitting: Family Medicine

## 2015-03-07 VITALS — BP 118/72 | HR 65 | Temp 98.1°F | Wt 193.0 lb

## 2015-03-07 DIAGNOSIS — J189 Pneumonia, unspecified organism: Secondary | ICD-10-CM | POA: Diagnosis not present

## 2015-03-07 DIAGNOSIS — R059 Cough, unspecified: Secondary | ICD-10-CM

## 2015-03-07 DIAGNOSIS — R05 Cough: Secondary | ICD-10-CM | POA: Diagnosis not present

## 2015-03-07 HISTORY — DX: Pneumonia, unspecified organism: J18.9

## 2015-03-07 MED ORDER — AZITHROMYCIN 250 MG PO TABS: ORAL_TABLET | ORAL | Status: DC

## 2015-03-07 MED ORDER — GUAIFENESIN-CODEINE 100-10 MG/5ML PO SYRP: 5.0000 mL | ORAL_SOLUTION | Freq: Every evening | ORAL | Status: DC | PRN

## 2015-03-07 MED ORDER — CEFTRIAXONE SODIUM 1 G IJ SOLR
1.0000 g | Freq: Once | INTRAMUSCULAR | Status: AC
  Administered 2015-03-07: 1 g via INTRAMUSCULAR

## 2015-03-07 NOTE — Patient Instructions (Signed)
I do think you have developed pneumonia - treat with shot of rocephin in office today then take zpack sent to pharmacy. Push fluids and rest, use plain mucinex with plenty of water to help mobilize mucous. May use cheratussin for cough at night time. Let us know if worsening symptoms despite treatment or just not improving as expected.  Community-Acquired Pneumonia, Adult Pneumonia is an infection of the lungs. There are different types of pneumonia. One type can develop while a person is in a hospital. A different type, called community-acquired pneumonia, develops in people who are not, or have not recently been, in the hospital or other health care facility.  CAUSES Pneumonia may be caused by bacteria, viruses, or funguses. Community-acquired pneumonia is often caused by Streptococcus pneumonia bacteria. These bacteria are often passed from one person to another by breathing in droplets from the cough or sneeze of an infected person. RISK FACTORS The condition is more likely to develop in:  People who havechronic diseases, such as chronic obstructive pulmonary disease (COPD), asthma, congestive heart failure, cystic fibrosis, diabetes, or kidney disease.  People who haveearly-stage or late-stage HIV.  People who havesickle cell disease.  People who havehad their spleen removed (splenectomy).  People who havepoor Human resources officer.  People who havemedical conditions that increase the risk of breathing in (aspirating) secretions their own mouth and nose.   People who havea weakened immune system (immunocompromised).  People who smoke.  People whotravel to areas where pneumonia-causing germs commonly exist.  People whoare around animal habitats or animals that have pneumonia-causing germs, including birds, bats, rabbits, cats, and farm animals. SYMPTOMS Symptoms of this condition include:  Adry cough.  A wet (productive) cough.  Fever.  Sweating.  Chest pain,  especially when breathing deeply or coughing.  Rapid breathing or difficulty breathing.  Shortness of breath.  Shaking chills.  Fatigue.  Muscle aches. DIAGNOSIS Your health care provider will take a medical history and perform a physical exam. You may also have other tests, including:  Imaging studies of your chest, including X-rays.  Tests to check your blood oxygen level and other blood gases.  Other tests on blood, mucus (sputum), fluid around your lungs (pleural fluid), and urine. If your pneumonia is severe, other tests may be done to identify the specific cause of your illness. TREATMENT The type of treatment that you receive depends on many factors, such as the cause of your pneumonia, the medicines you take, and other medical conditions that you have. For most adults, treatment and recovery from pneumonia may occur at home. In some cases, treatment must happen in a hospital. Treatment may include:  Antibiotic medicines, if the pneumonia was caused by bacteria.  Antiviral medicines, if the pneumonia was caused by a virus.  Medicines that are given by mouth or through an IV tube.  Oxygen.  Respiratory therapy. Although rare, treating severe pneumonia may include:  Mechanical ventilation. This is done if you are not breathing well on your own and you cannot maintain a safe blood oxygen level.  Thoracentesis. This procedureremoves fluid around one lung or both lungs to help you breathe better. HOME CARE INSTRUCTIONS  Take over-the-counter and prescription medicines only as told by your health care provider.  Only takecough medicine if you are losing sleep. Understand that cough medicine can prevent your body's natural ability to remove mucus from your lungs.  If you were prescribed an antibiotic medicine, take it as told by your health care provider. Do not stop taking the  antibiotic even if you start to feel better.  Sleep in a semi-upright position at night. Try  sleeping in a reclining chair, or place a few pillows under your head.  Do not use tobacco products, including cigarettes, chewing tobacco, and e-cigarettes. If you need help quitting, ask your health care provider.  Drink enough water to keep your urine clear or pale yellow. This will help to thin out mucus secretions in your lungs. PREVENTION There are ways that you can decrease your risk of developing community-acquired pneumonia. Consider getting a pneumococcal vaccine if:  You are older than 69 years of age.  You are older than 69 years of age and are undergoing cancer treatment, have chronic lung disease, or have other medical conditions that affect your immune system. Ask your health care provider if this applies to you. There are different types and schedules of pneumococcal vaccines. Ask your health care provider which vaccination option is best for you. You may also prevent community-acquired pneumonia if you take these actions:  Get an influenza vaccine every year. Ask your health care provider which type of influenza vaccine is best for you.  Go to the dentist on a regular basis.  Wash your hands often. Use hand sanitizer if soap and water are not available. SEEK MEDICAL CARE IF:  You have a fever.  You are losing sleep because you cannot control your cough with cough medicine. SEEK IMMEDIATE MEDICAL CARE IF:  You have worsening shortness of breath.  You have increased chest pain.  Your sickness becomes worse, especially if you are an older adult or have a weakened immune system.  You cough up blood.   This information is not intended to replace advice given to you by your health care provider. Make sure you discuss any questions you have with your health care provider.   Document Released: 05/14/2005 Document Revised: 02/02/2015 Document Reviewed: 09/08/2014 Elsevier Interactive Patient Education [employer].

## 2015-03-07 NOTE — Assessment & Plan Note (Addendum)
Concern for LLL CAP. Check CXR today - with some consolidation noted at LLL. Treat with rocephin 1gm + zpack. Cheratussin for cough at night time. Update if worsening despite improvement.

## 2015-03-07 NOTE — Progress Notes (Signed)
Pre visit review using our clinic review tool, if applicable. No additional management support is needed unless otherwise documented below in the visit note. 

## 2015-03-07 NOTE — Progress Notes (Signed)
BP 118/72 mmHg  Pulse 65  Temp(Src) 98.1 F (36.7 C) (Oral)  Wt 193 lb (87.544 kg)  SpO2 96%   CC: cough  Subjective:    Patient ID: Christian Kim, male    DOB: Jun 08, 1945, 69 y.o.   MRN: [id number]  HPI: Christian Kim is a 69 y.o. male presenting on 03/07/2015 for Cough   10d h/o productive cough, HA, coughing fits where he gets lightheaded. Persistent sweats. Chest > head congestion. Started with fever/chills, diaphoresis and sore throat. Tried OTC remedies without improvement. Sore chest, no pressure.  No abd pain, ear or tooth pain, PNDrainage.  No sick contacts at home. No smokers at home.  No h/o asthma.   Relevant past medical, surgical, family and social history reviewed and updated as indicated. Interim medical history since our last visit reviewed. Allergies and medications reviewed and updated. Current Outpatient Prescriptions on File Prior to Visit  Medication Sig  . amLODipine (NORVASC) 10 MG tablet Take 1 tablet (10 mg total) by mouth daily.  [name] aspirin 81 MG tablet Take 81 mg by mouth daily.    [name] atorvastatin (LIPITOR) 20 MG tablet Take 1 tablet (20 mg total) by mouth daily.  . colchicine 0.6 MG tablet Take 1 tablet (0.6 mg total) by mouth daily as needed.  [name] ibuprofen (ADVIL,MOTRIN) 200 MG tablet Take 200 mg by mouth every 6 (six) hours as needed for pain.  [name] lisinopril (PRINIVIL,ZESTRIL) 40 MG tablet Take 1 tablet (40 mg total) by mouth daily.  . metoprolol succinate (TOPROL-XL) 50 MG 24 hr tablet take 1 tablet by mouth once daily WITH OR IMMEDIATELY FOLLOWING A MEAL  . omeprazole (PRILOSEC OTC) 20 MG tablet Take 1 tablet (20 mg total) by mouth daily.  . ursodiol (ACTIGALL) 300 MG capsule take 2 capsules by mouth twice a day   No current facility-administered medications on file prior to visit.   Past Medical History  Diagnosis Date  . Hyperlipidemia   . Hypertension   . CAD (coronary artery disease) 2008    stent  . Choledocholithiasis    recurrent/multiple ERCPs with stone extraction and sphincterotomy   . Gout 2013    dx by crystal analysis  . GERD (gastroesophageal reflux disease)   . Myocardial infarction [facility]) 12-2006    Past Surgical History  Procedure Laterality Date  . Cholecystectomy  2006    w/ revision 2/2 persistent gallstones  . Nasal septum surgery    . Coronary angioplasty with stent placement  2008    stent, Dr. Hyman Hopes  . Colonoscopy  06/2013    1 polyp, rpt 5 yrs [name])    Review of Systems Per HPI unless specifically indicated above     Objective:    BP 118/72 mmHg  Pulse 65  Temp(Src) 98.1 F (36.7 C) (Oral)  Wt 193 lb (87.544 kg)  SpO2 96%  Wt Readings from Last 3 Encounters:  03/07/15 193 lb (87.544 kg)  06/23/14 193 lb (87.544 kg)  06/07/14 195 lb 8 oz (88.678 kg)    Physical Exam  Constitutional: He appears well-developed and well-nourished. No distress.  HENT:  Head: Normocephalic and atraumatic.  Right Ear: Hearing, tympanic membrane, external ear and ear canal normal.  Left Ear: Hearing, tympanic membrane, external ear and ear canal normal.  Nose: Mucosal edema present. No rhinorrhea. Right sinus exhibits no maxillary sinus tenderness and no frontal sinus tenderness. Left sinus exhibits no maxillary sinus tenderness and no frontal sinus tenderness.  Mouth/Throat: Uvula is  midline, oropharynx is clear and moist and mucous membranes are normal. No oropharyngeal exudate, posterior oropharyngeal edema, posterior oropharyngeal erythema or tonsillar abscesses.  Eyes: Conjunctivae and EOM are normal. Pupils are equal, round, and reactive to light. No scleral icterus.  Neck: Normal range of motion. Neck supple.  Cardiovascular: Normal rate, regular rhythm, normal heart sounds and intact distal pulses.   No murmur heard. Pulmonary/Chest: Effort normal. No respiratory distress. He has no wheezes. He has rales (LLL).  Deep hoarse cough  Lymphadenopathy:    He has no cervical adenopathy.    Skin: Skin is warm and dry. No rash noted.  Nursing note and vitals reviewed.  Lab Results  Component Value Date   CREATININE 1.3 05/31/2014       Assessment & Plan:   Problem List Items Addressed This Visit    CAP (community acquired pneumonia) - Primary    Concern for LLL CAP. Check CXR today - with some consolidation noted at LLL. Treat with rocephin 1gm + zpack. Cheratussin for cough at night time. Update if worsening despite improvement.      Relevant Medications   azithromycin (ZITHROMAX) 250 MG tablet   guaiFENesin-codeine (ROBITUSSIN AC) 100-10 MG/5ML syrup   Other Relevant Orders   DG Chest 2 View       Follow up plan: Return if symptoms worsen or fail to improve.

## 2015-03-07 NOTE — Addendum Note (Signed)
Addended by: Avilla Lions on: 03/07/2015 01:03 PM   Modules accepted: Orders

## 2015-03-16 ENCOUNTER — Ambulatory Visit (INDEPENDENT_AMBULATORY_CARE_PROVIDER_SITE_OTHER): Payer: Medicare PPO | Admitting: Family Medicine

## 2015-03-16 ENCOUNTER — Encounter: Payer: Self-pay | Admitting: Family Medicine

## 2015-03-16 VITALS — BP 122/70 | HR 96 | Temp 98.0°F | Wt 191.5 lb

## 2015-03-16 DIAGNOSIS — J189 Pneumonia, unspecified organism: Secondary | ICD-10-CM

## 2015-03-16 MED ORDER — BENZONATATE 100 MG PO CAPS: 100.0000 mg | ORAL_CAPSULE | Freq: Three times a day (TID) | ORAL | Status: DC | PRN

## 2015-03-16 NOTE — Progress Notes (Signed)
BP 122/70 mmHg  Pulse 96  Temp(Src) 98 F (36.7 C) (Oral)  Wt 191 lb 8 oz (86.864 kg)  SpO2 97%   CC: f/u visit  Subjective:    Patient ID: Christian Kim, male    DOB: 10/05/1945, 69 y.o.   MRN: [id number]  HPI: Christian Kim is a 69 y.o. male presenting on 03/16/2015 for Follow-up   Seen here 03/07/2015 with 10d h/o productive cough with dx CAP and CXR showing LLL opacity. Treated with rocephin 1gm + zpack and cheratussin for night time cough. Improved but still with coughing fits and mild production of mucous. Staying fatigued.   No fevers/chills, no more sweats. Headache improved.   No sick contacts at home. No smokers at home.  No h/o asthma.   Relevant past medical, surgical, family and social history reviewed and updated as indicated. Interim medical history since our last visit reviewed. Allergies and medications reviewed and updated. Current Outpatient Prescriptions on File Prior to Visit  Medication Sig  . amLODipine (NORVASC) 10 MG tablet Take 1 tablet (10 mg total) by mouth daily.  [name] aspirin 81 MG tablet Take 81 mg by mouth daily.    [name] atorvastatin (LIPITOR) 20 MG tablet Take 1 tablet (20 mg total) by mouth daily.  . colchicine 0.6 MG tablet Take 1 tablet (0.6 mg total) by mouth daily as needed.  [name] guaiFENesin-codeine (ROBITUSSIN AC) 100-10 MG/5ML syrup Take 5 mLs by mouth at bedtime as needed.  [name] ibuprofen (ADVIL,MOTRIN) 200 MG tablet Take 200 mg by mouth every 6 (six) hours as needed for pain.  [name] lisinopril (PRINIVIL,ZESTRIL) 40 MG tablet Take 1 tablet (40 mg total) by mouth daily.  . metoprolol succinate (TOPROL-XL) 50 MG 24 hr tablet take 1 tablet by mouth once daily WITH OR IMMEDIATELY FOLLOWING A MEAL  . omeprazole (PRILOSEC OTC) 20 MG tablet Take 1 tablet (20 mg total) by mouth daily.  . ursodiol (ACTIGALL) 300 MG capsule take 2 capsules by mouth twice a day   No current facility-administered medications on file prior to visit.    Review of  Systems Per HPI unless specifically indicated above     Objective:    BP 122/70 mmHg  Pulse 96  Temp(Src) 98 F (36.7 C) (Oral)  Wt 191 lb 8 oz (86.864 kg)  SpO2 97%  Wt Readings from Last 3 Encounters:  03/16/15 191 lb 8 oz (86.864 kg)  03/07/15 193 lb (87.544 kg)  06/23/14 193 lb (87.544 kg)    Physical Exam  Constitutional: He appears well-developed and well-nourished. No distress.  HENT:  Head: Normocephalic and atraumatic.  Right Ear: Hearing, tympanic membrane, external ear and ear canal normal.  Left Ear: Hearing, tympanic membrane, external ear and ear canal normal.  Nose: Nose normal. No mucosal edema or rhinorrhea. Right sinus exhibits no maxillary sinus tenderness and no frontal sinus tenderness. Left sinus exhibits no maxillary sinus tenderness and no frontal sinus tenderness.  Mouth/Throat: Uvula is midline, oropharynx is clear and moist and mucous membranes are normal. No oropharyngeal exudate, posterior oropharyngeal edema, posterior oropharyngeal erythema or tonsillar abscesses.  Eyes: Conjunctivae and EOM are normal. Pupils are equal, round, and reactive to light. No scleral icterus.  Neck: Normal range of motion. Neck supple.  Cardiovascular: Normal rate, regular rhythm, normal heart sounds and intact distal pulses.   No murmur heard. Pulmonary/Chest: Effort normal. No respiratory distress. He has no wheezes. He has no rhonchi. He has rales (persistent at LLL) in the left  lower field.  Lymphadenopathy:    He has no cervical adenopathy.  Skin: Skin is warm and dry. No rash noted.  Nursing note and vitals reviewed.   CHEST 2 VIEW COMPARISON: 12/12/2012 FINDINGS: Increasing density noted at the left lung base. Cannot exclude infiltrate/pneumonia. Right lung is clear. Heart is normal size. No effusions. No acute bony abnormality. IMPRESSION: Left lower lobe airspace opacity. Cannot exclude pneumonia Electronically Signed  By: Rolm Baptise M.D.  On:  03/07/2015 13:35    Assessment & Plan:   Problem List Items Addressed This Visit    CAP (community acquired pneumonia) - Primary    Healing LLL CAP - still too early to expect full resolution. Discussed anticipated healing course with patient. No need for further abx today, supportive care discussed. Continue cheratussin, mucinex, add ibuprofen and tessalon perls during day.  If persistent cough into 3-4 wks, rec return for rpt CXR. Pt agrees with plan.      Relevant Medications   benzonatate (TESSALON) 100 MG capsule       Follow up plan: Return if symptoms worsen or fail to improve.

## 2015-03-16 NOTE — Assessment & Plan Note (Signed)
Healing LLL CAP - still too early to expect full resolution. Discussed anticipated healing course with patient. No need for further abx today, supportive care discussed. Continue cheratussin, mucinex, add ibuprofen and tessalon perls during day.  If persistent cough into 3-4 wks, rec return for rpt CXR. Pt agrees with plan.

## 2015-03-16 NOTE — Progress Notes (Signed)
Pre visit review using our clinic review tool, if applicable. No additional management support is needed unless otherwise documented below in the visit note. 

## 2015-03-16 NOTE — Patient Instructions (Signed)
I think this is persistent cough after pneumonia - will take more time to heal - up to several weeks. Continue codeine cough syrup at night time. Add on ibuprofen 400-600mg  twice daily with food for 5 days. Add on daytime tessalon perls for cough suppression (swallow don't chew) Continue mucinex with glass of water. Let us know if persistent cough over next 2 week or any worsening (fever >101, worsening productive cough instead of improving)

## 2015-05-16 ENCOUNTER — Other Ambulatory Visit: Payer: Self-pay | Admitting: Family Medicine

## 2015-06-06 ENCOUNTER — Other Ambulatory Visit: Payer: Medicare PPO

## 2015-06-06 ENCOUNTER — Other Ambulatory Visit: Payer: Self-pay | Admitting: Family Medicine

## 2015-06-06 DIAGNOSIS — M109 Gout, unspecified: Secondary | ICD-10-CM

## 2015-06-06 DIAGNOSIS — I10 Essential (primary) hypertension: Secondary | ICD-10-CM

## 2015-06-06 DIAGNOSIS — N289 Disorder of kidney and ureter, unspecified: Secondary | ICD-10-CM

## 2015-06-06 DIAGNOSIS — R972 Elevated prostate specific antigen [PSA]: Secondary | ICD-10-CM

## 2015-06-06 DIAGNOSIS — E785 Hyperlipidemia, unspecified: Secondary | ICD-10-CM

## 2015-06-09 ENCOUNTER — Other Ambulatory Visit: Payer: Self-pay | Admitting: *Deleted

## 2015-06-09 ENCOUNTER — Other Ambulatory Visit (INDEPENDENT_AMBULATORY_CARE_PROVIDER_SITE_OTHER): Payer: Medicare Other

## 2015-06-09 DIAGNOSIS — N289 Disorder of kidney and ureter, unspecified: Secondary | ICD-10-CM

## 2015-06-09 DIAGNOSIS — I10 Essential (primary) hypertension: Secondary | ICD-10-CM | POA: Diagnosis not present

## 2015-06-09 DIAGNOSIS — R972 Elevated prostate specific antigen [PSA]: Secondary | ICD-10-CM

## 2015-06-09 DIAGNOSIS — E785 Hyperlipidemia, unspecified: Secondary | ICD-10-CM | POA: Diagnosis not present

## 2015-06-09 DIAGNOSIS — K219 Gastro-esophageal reflux disease without esophagitis: Secondary | ICD-10-CM

## 2015-06-09 DIAGNOSIS — M109 Gout, unspecified: Secondary | ICD-10-CM | POA: Diagnosis not present

## 2015-06-09 LAB — VITAMIN D 25 HYDROXY (VIT D DEFICIENCY, FRACTURES): VITD: 33.7 ng/mL (ref 30.00–100.00)

## 2015-06-09 LAB — COMPREHENSIVE METABOLIC PANEL
ALBUMIN: 4.5 g/dL (ref 3.5–5.2)
ALK PHOS: 114 U/L (ref 39–117)
ALT: 18 U/L (ref 0–53)
AST: 16 U/L (ref 0–37)
BILIRUBIN TOTAL: 1.1 mg/dL (ref 0.2–1.2)
BUN: 25 mg/dL — ABNORMAL HIGH (ref 6–23)
CALCIUM: 9.8 mg/dL (ref 8.4–10.5)
CO2: 31 mEq/L (ref 19–32)
Chloride: 104 mEq/L (ref 96–112)
Creatinine, Ser: 1.44 mg/dL (ref 0.40–1.50)
GFR: 51.54 mL/min — AB (ref >60.00)
Glucose, Bld: 101 mg/dL — ABNORMAL HIGH (ref 70–99)
POTASSIUM: 4.2 meq/L (ref 3.5–5.1)
Sodium: 142 mEq/L (ref 135–145)
TOTAL PROTEIN: 6.9 g/dL (ref 6.0–8.3)

## 2015-06-09 LAB — LDL CHOLESTEROL, DIRECT: Direct LDL: 68 mg/dL

## 2015-06-09 LAB — PSA: PSA: 1.34 ng/mL (ref 0.10–4.00)

## 2015-06-09 LAB — LIPID PANEL
CHOLESTEROL: 126 mg/dL (ref 0–200)
HDL: 34.8 mg/dL — AB (ref >39.00)
NonHDL: 91.33
TRIGLYCERIDES: 233 mg/dL — AB (ref 0.0–149.0)
Total CHOL/HDL Ratio: 4
VLDL: 46.6 mg/dL — ABNORMAL HIGH (ref 0.0–40.0)

## 2015-06-09 LAB — URIC ACID: URIC ACID, SERUM: 7.7 mg/dL (ref 4.0–7.8)

## 2015-06-09 MED ORDER — OMEPRAZOLE MAGNESIUM 20 MG PO TBEC: 20.0000 mg | DELAYED_RELEASE_TABLET | Freq: Every day | ORAL | Status: DC

## 2015-06-13 ENCOUNTER — Encounter: Payer: Medicare PPO | Admitting: Family Medicine

## 2015-06-14 ENCOUNTER — Ambulatory Visit (INDEPENDENT_AMBULATORY_CARE_PROVIDER_SITE_OTHER): Payer: Medicare Other | Admitting: Family Medicine

## 2015-06-14 ENCOUNTER — Encounter: Payer: Self-pay | Admitting: Family Medicine

## 2015-06-14 VITALS — BP 132/72 | HR 64 | Temp 98.0°F | Wt 196.0 lb

## 2015-06-14 DIAGNOSIS — K831 Obstruction of bile duct: Secondary | ICD-10-CM

## 2015-06-14 DIAGNOSIS — N289 Disorder of kidney and ureter, unspecified: Secondary | ICD-10-CM

## 2015-06-14 DIAGNOSIS — I2510 Atherosclerotic heart disease of native coronary artery without angina pectoris: Secondary | ICD-10-CM

## 2015-06-14 DIAGNOSIS — E785 Hyperlipidemia, unspecified: Secondary | ICD-10-CM

## 2015-06-14 DIAGNOSIS — Z23 Encounter for immunization: Secondary | ICD-10-CM | POA: Diagnosis not present

## 2015-06-14 DIAGNOSIS — R972 Elevated prostate specific antigen [PSA]: Secondary | ICD-10-CM

## 2015-06-14 DIAGNOSIS — Z7189 Other specified counseling: Secondary | ICD-10-CM

## 2015-06-14 DIAGNOSIS — Z0000 Encounter for general adult medical examination without abnormal findings: Secondary | ICD-10-CM

## 2015-06-14 DIAGNOSIS — M109 Gout, unspecified: Secondary | ICD-10-CM

## 2015-06-14 DIAGNOSIS — Z8701 Personal history of pneumonia (recurrent): Secondary | ICD-10-CM

## 2015-06-14 DIAGNOSIS — I10 Essential (primary) hypertension: Secondary | ICD-10-CM

## 2015-06-14 NOTE — Assessment & Plan Note (Signed)
Preventative protocols reviewed and updated unless pt declined. Discussed healthy diet and lifestyle.  

## 2015-06-14 NOTE — Assessment & Plan Note (Signed)

## 2015-06-14 NOTE — Patient Instructions (Addendum)
Tdap today. Look at advanced directive packet and health care power of attorney form. Watch added sugar in diet. Return as needed or in 1 year for next medicare wellness visit.  Health Maintenance, Male A healthy lifestyle and preventative care can promote health and wellness.  Maintain regular health, dental, and eye exams.  Eat a healthy diet. Foods like vegetables, fruits, whole grains, low-fat dairy products, and lean protein foods contain the nutrients you need and are low in calories. Decrease your intake of foods high in solid fats, added sugars, and salt. Get information about a proper diet from your health care provider, if necessary.  Regular physical exercise is one of the most important things you can do for your health. Most adults should get at least 150 minutes of moderate-intensity exercise (any activity that increases your heart rate and causes you to sweat) each week. In addition, most adults need muscle-strengthening exercises on 2 or more days a week.   Maintain a healthy weight. The body mass index (BMI) is a screening tool to identify possible weight problems. It provides an estimate of body fat based on height and weight. Your health care provider can find your BMI and can help you achieve or maintain a healthy weight. For males 20 years and older:  A BMI below 18.5 is considered underweight.  A BMI of 18.5 to 24.9 is normal.  A BMI of 25 to 29.9 is considered overweight.  A BMI of 30 and above is considered obese.  Maintain normal blood lipids and cholesterol by exercising and minimizing your intake of saturated fat. Eat a balanced diet with plenty of fruits and vegetables. Blood tests for lipids and cholesterol should begin at age 8 and be repeated every 5 years. If your lipid or cholesterol levels are high, you are over age 38, or you are at high risk for heart disease, you may need your cholesterol levels checked more frequently.Ongoing high lipid and cholesterol  levels should be treated with medicines if diet and exercise are not working.  If you smoke, find out from your health care provider how to quit. If you do not use tobacco, do not start.  Lung cancer screening is recommended for adults aged 35-80 years who are at high risk for developing lung cancer because of a history of smoking. A yearly low-dose CT scan of the lungs is recommended for people who have at least a 30-pack-year history of smoking and are current smokers or have quit within the past 15 years. A pack year of smoking is smoking an average of 1 pack of cigarettes a day for 1 year (for example, a 30-pack-year history of smoking could mean smoking 1 pack a day for 30 years or 2 packs a day for 15 years). Yearly screening should continue until the smoker has stopped smoking for at least 15 years. Yearly screening should be stopped for people who develop a health problem that would prevent them from having lung cancer treatment.  If you choose to drink alcohol, do not have more than 2 drinks per day. One drink is considered to be 12 oz (360 mL) of beer, 5 oz (150 mL) of wine, or 1.5 oz (45 mL) of liquor.  Avoid the use of street drugs. Do not share needles with anyone. Ask for help if you need support or instructions about stopping the use of drugs.  High blood pressure causes heart disease and increases the risk of stroke. High blood pressure is more likely to  develop in:  People who have blood pressure in the end of the normal range (100-139/85-89 mm Hg).  People who are overweight or obese.  People who are African American.  If you are 52-27 years of age, have your blood pressure checked every 3-5 years. If you are 45 years of age or older, have your blood pressure checked every year. You should have your blood pressure measured twice--once when you are at a hospital or clinic, and once when you are not at a hospital or clinic. Record the average of the two measurements. To check your  blood pressure when you are not at a hospital or clinic, you can use:  An automated blood pressure machine at a pharmacy.  A home blood pressure monitor.  If you are 58-70 years old, ask your health care provider if you should take aspirin to prevent heart disease.  Diabetes screening involves taking a blood sample to check your fasting blood sugar level. This should be done once every 3 years after age 54 if you are at a normal weight and without risk factors for diabetes. Testing should be considered at a younger age or be carried out more frequently if you are overweight and have at least 1 risk factor for diabetes.  Colorectal cancer can be detected and often prevented. Most routine colorectal cancer screening begins at the age of 51 and continues through age 50. However, your health care provider may recommend screening at an earlier age if you have risk factors for colon cancer. On a yearly basis, your health care provider may provide home test kits to check for hidden blood in the stool. A small camera at the end of a tube may be used to directly examine the colon (sigmoidoscopy or colonoscopy) to detect the earliest forms of colorectal cancer. Talk to your health care provider about this at age 29 when routine screening begins. A direct exam of the colon should be repeated every 5-10 years through age 58, unless early forms of precancerous polyps or small growths are found.  People who are at an increased risk for hepatitis B should be screened for this virus. You are considered at high risk for hepatitis B if:  You were born in a country where hepatitis B occurs often. Talk with your health care provider about which countries are considered high risk.  Your parents were born in a high-risk country and you have not received a shot to protect against hepatitis B (hepatitis B vaccine).  You have HIV or AIDS.  You use needles to inject street drugs.  You live with, or have sex with,  someone who has hepatitis B.  You are a man who has sex with other men (MSM).  You get hemodialysis treatment.  You take certain medicines for conditions like cancer, organ transplantation, and autoimmune conditions.  Hepatitis C blood testing is recommended for all people born from [birth year] through 1965 and any individual with known risk factors for hepatitis C.  Healthy men should no longer receive prostate-specific antigen (PSA) blood tests as part of routine cancer screening. Talk to your health care provider about prostate cancer screening.  Testicular cancer screening is not recommended for adolescents or adult males who have no symptoms. Screening includes self-exam, a health care provider exam, and other screening tests. Consult with your health care provider about any symptoms you have or any concerns you have about testicular cancer.  Practice safe sex. Use condoms and avoid high-risk sexual practices to reduce  the spread of sexually transmitted infections (STIs).  You should be screened for STIs, including gonorrhea and chlamydia if:  You are sexually active and are younger than 24 years.  You are older than 24 years, and your health care provider tells you that you are at risk for this type of infection.  Your sexual activity has changed since you were last screened, and you are at an increased risk for chlamydia or gonorrhea. Ask your health care provider if you are at risk.  If you are at risk of being infected with HIV, it is recommended that you take a prescription medicine daily to prevent HIV infection. This is called pre-exposure prophylaxis (PrEP). You are considered at risk if:  You are a man who has sex with other men (MSM).  You are a heterosexual man who is sexually active with multiple partners.  You take drugs by injection.  You are sexually active with a partner who has HIV.  Talk with your health care provider about whether you are at high risk of being  infected with HIV. If you choose to begin PrEP, you should first be tested for HIV. You should then be tested every 3 months for as long as you are taking PrEP.  Use sunscreen. Apply sunscreen liberally and repeatedly throughout the day. You should seek shade when your shadow is shorter than you. Protect yourself by wearing long sleeves, pants, a wide-brimmed hat, and sunglasses year round whenever you are outdoors.  Tell your health care provider of new moles or changes in moles, especially if there is a change in shape or color. Also, tell your health care provider if a mole is larger than the size of a pencil eraser.  A one-time screening for abdominal aortic aneurysm (AAA) and surgical repair of large AAAs by ultrasound is recommended for men aged 58-75 years who are current or former smokers.  Stay current with your vaccines (immunizations).   This information is not intended to replace advice given to you by your health care provider. Make sure you discuss any questions you have with your health care provider.   Document Released: 11/10/2007 Document Revised: 06/04/2014 Document Reviewed: 10/09/2010 Elsevier Interactive Patient Education [employer].

## 2015-06-14 NOTE — Progress Notes (Signed)
Pre visit review using our clinic review tool, if applicable. No additional management support is needed unless otherwise documented below in the visit note. 

## 2015-06-14 NOTE — Progress Notes (Signed)
BP 132/72 mmHg  Pulse 64  Temp(Src) 98 F (36.7 C) (Oral)  Wt 196 lb (88.905 kg)   CC: medicare wellness visit  Subjective:    Patient ID: Christian Kim, male    DOB: 1945-10-27, 70 y.o.   MRN: [id number]  HPI: KELLY MARGULIS is a 70 y.o. male presenting on 06/14/2015 for Annual Exam   Chronic ursodiol use 2/2 recurrent gallstones after cholecystectomy. Worse with fatty greasy foods. prilosec has also significantly helped.  Passes hearing and vision screens. Denies depression/anhedonia, sadness. No falls in the last year.  Preventative: COLONOSCOPY Date: 06/2013 1 polyp, rpt 5 yrs [name])  Prostate cancer screening - has had routinely checked but not in last few years. Denies nocturia or LUTS sxs. Declines today.  Flu shot yearly Pneumovax 2012, prevnar 05/2014 Td 2003 - Tdap today.  Zostavax 2012.  Advanced directives: requests packet today. Would want wife to be HCPOA.  Seat belt use discussed Sunscreen use discussed. No changing moles on skin.   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 Interim medical history since our last visit reviewed. Allergies and medications reviewed and updated. Current Outpatient Prescriptions on File Prior to Visit  Medication Sig  . amLODipine (NORVASC) 10 MG tablet Take 1 tablet (10 mg total) by mouth daily.  [name] aspirin 81 MG tablet Take 81 mg by mouth daily.    [name] atorvastatin (LIPITOR) 20 MG tablet Take 1 tablet (20 mg total) by mouth daily.  . colchicine 0.6 MG tablet Take 1 tablet (0.6 mg total) by mouth daily as needed.  [name] ibuprofen (ADVIL,MOTRIN) 200 MG tablet Take 200 mg by mouth every 6 (six) hours as needed for pain.  [name] lisinopril (PRINIVIL,ZESTRIL) 40  MG tablet take 1 tablet by mouth daily  . metoprolol succinate (TOPROL-XL) 50 MG 24 hr tablet take 1 tablet by mouth once daily WITH OR IMMEDIATELY FOLLOWING A MEAL  . omeprazole (PRILOSEC OTC) 20 MG tablet Take 1 tablet (20 mg total) by mouth daily.  . ursodiol (ACTIGALL) 300 MG capsule take 2 capsules by mouth twice a day   No current facility-administered medications on file prior to visit.    Review of Systems  Constitutional: Negative for fever, chills, activity change, appetite change, fatigue and unexpected weight change.  HENT: Negative for hearing loss.   Eyes: Negative for visual disturbance.  Respiratory: Negative for cough, chest tightness, shortness of breath and wheezing.   Cardiovascular: Negative for chest pain, palpitations and leg swelling.  Gastrointestinal: Positive for nausea (mild gi bug?), abdominal pain and diarrhea (?gi bug). Negative for vomiting, constipation, blood in stool and abdominal distention.  Genitourinary: Negative for hematuria and difficulty urinating.  Musculoskeletal: Negative for myalgias, arthralgias and neck pain.  Skin: Negative for rash.  Neurological: Negative for dizziness, seizures, syncope and headaches.  Hematological: Negative for adenopathy. Does not bruise/bleed easily.  Psychiatric/Behavioral: Negative for dysphoric mood. The patient is not nervous/anxious.    Per HPI unless specifically indicated in ROS section     Objective:    BP 132/72 mmHg  Pulse 64  Temp(Src) 98 F (36.7 C) (Oral)  Wt 196 lb (88.905 kg)  Wt Readings from Last 3 Encounters:  06/14/15 196 lb (88.905 kg)  03/16/15 191 lb 8  oz (86.864 kg)  03/07/15 193 lb (87.544 kg)    Physical Exam  Constitutional: He is oriented to person, place, and time. He appears well-developed and well-nourished. No distress.  HENT:  Head: Normocephalic and atraumatic.  Right Ear: Hearing, tympanic membrane, external ear and ear canal normal.  Left Ear: Hearing, tympanic  membrane, external ear and ear canal normal.  Nose: Nose normal.  Mouth/Throat: Uvula is midline, oropharynx is clear and moist and mucous membranes are normal. No oropharyngeal exudate, posterior oropharyngeal edema or posterior oropharyngeal erythema.  Eyes: Conjunctivae and EOM are normal. Pupils are equal, round, and reactive to light. No scleral icterus.  Neck: Normal range of motion. Neck supple. Carotid bruit is not present. No thyromegaly present.  Cardiovascular: Normal rate, regular rhythm, normal heart sounds and intact distal pulses.   No murmur heard. Pulses:      Radial pulses are 2+ on the right side, and 2+ on the left side.  Pulmonary/Chest: Effort normal and breath sounds normal. No respiratory distress. He has no wheezes. He has no rales.  Abdominal: Soft. Bowel sounds are normal. He exhibits no distension and no mass. There is no tenderness. There is no rebound and no guarding.  Musculoskeletal: Normal range of motion. He exhibits no edema.  Lymphadenopathy:    He has no cervical adenopathy.  Neurological: He is alert and oriented to person, place, and time.  CN grossly intact, station and gait intact Recall 2/3, 3/3 with cue Calculation 5/5 serial 7s  Skin: Skin is warm and dry. No rash noted.  Psychiatric: He has a normal mood and affect. His behavior is normal. Judgment and thought content normal.  Nursing note and vitals reviewed.  Results for orders placed or performed in visit on 06/09/15  Lipid panel  Result Value Ref Range   Cholesterol 126 0 - 200 mg/dL   Triglycerides 233.0 (H) 0.0 - 149.0 mg/dL   HDL 34.80 (L) >39.00 mg/dL   VLDL 46.6 (H) 0.0 - 40.0 mg/dL   Total CHOL/HDL Ratio 4    NonHDL 91.33   Comprehensive metabolic panel  Result Value Ref Range   Sodium 142 135 - 145 mEq/L   Potassium 4.2 3.5 - 5.1 mEq/L   Chloride 104 96 - 112 mEq/L   CO2 31 19 - 32 mEq/L   Glucose, Bld 101 (H) 70 - 99 mg/dL   BUN 25 (H) 6 - 23 mg/dL   Creatinine, Ser 1.44  0.40 - 1.50 mg/dL   Total Bilirubin 1.1 0.2 - 1.2 mg/dL   Alkaline Phosphatase 114 39 - 117 U/L   AST 16 0 - 37 U/L   ALT 18 0 - 53 U/L   Total Protein 6.9 6.0 - 8.3 g/dL   Albumin 4.5 3.5 - 5.2 g/dL   Calcium 9.8 8.4 - 10.5 mg/dL   GFR 51.54 (L) >60.00 mL/min  Uric acid  Result Value Ref Range   Uric Acid, Serum 7.7 4.0 - 7.8 mg/dL  PSA  Result Value Ref Range   PSA 1.34 0.10 - 4.00 ng/mL  VITAMIN D 25 Hydroxy (Vit-D Deficiency, Fractures)  Result Value Ref Range   VITD 33.70 30.00 - 100.00 ng/mL  LDL cholesterol, direct  Result Value Ref Range   Direct LDL 68.0 mg/dL      Assessment & Plan:   Problem List Items Addressed This Visit    Renal insufficiency    Reviewed labwork with patient. Encouraged good hydration status.      Medicare annual wellness  visit, subsequent - Primary    I have personally reviewed the Medicare Annual Wellness questionnaire and have noted 1. The patient's medical and social history 2. Their use of alcohol, tobacco or illicit drugs 3. Their current medications and supplements 4. The patient's functional ability including ADL's, fall risks, home safety risks and hearing or visual impairment. Cognitive function has been assessed and addressed as indicated.  5. Diet and physical activity 6. Evidence for depression or mood disorders The patients weight, height, BMI have been recorded in the chart. I have made referrals, counseling and provided education to the patient based on review of the above and I have provided the pt with a written personalized care plan for preventive services. Provider list updated.. See scanned questionairre as needed for further documentation. Reviewed preventative protocols and updated unless pt declined.       Increased prostate specific antigen (PSA) velocity    PSA stable. Declines DRE today. No fmhx.      HLD (hyperlipidemia)    Reviewed labwork with patient. LDL at goal, triglycerides too high - encouraged  avoiding added sugars in diet.      History of pneumonia    Has completed pneumococcal vaccinations.      Health maintenance examination    Preventative protocols reviewed and updated unless pt declined. Discussed healthy diet and lifestyle.       Gout    Uric acid elevated but no recent flares - continue prn colchicine.      Essential hypertension    Chronic, stable. Continue current regimen.      Coronary atherosclerosis    Stable, asxs.      Common bile duct (CBD) obstruction    H/o this. On ursodiol.      Advanced care planning/counseling discussion    Advanced directives: requests packet today. Would want wife to be HCPOA.        Other Visit Diagnoses    Need for Tdap vaccination        Relevant Orders    Tdap vaccine greater than or equal to 7yo IM (Completed)        Follow up plan: Return in about 1 year (around 06/13/2016), or as needed, for medicare wellness visit.

## 2015-06-14 NOTE — Assessment & Plan Note (Signed)
Advanced directives: requests packet today. Would want wife to be HCPOA.  

## 2015-06-14 NOTE — Assessment & Plan Note (Signed)
H/o this. On ursodiol.

## 2015-06-14 NOTE — Assessment & Plan Note (Signed)
Stable, asxs. 

## 2015-06-14 NOTE — Assessment & Plan Note (Signed)
Chronic, stable. Continue current regimen. 

## 2015-06-15 ENCOUNTER — Encounter: Payer: Self-pay | Admitting: Family Medicine

## 2015-06-15 NOTE — Assessment & Plan Note (Signed)
Uric acid elevated but no recent flares - continue prn colchicine.

## 2015-06-15 NOTE — Assessment & Plan Note (Signed)
Reviewed labwork with patient. Encouraged good hydration status.

## 2015-06-15 NOTE — Assessment & Plan Note (Signed)
Has completed pneumococcal vaccinations.

## 2015-06-15 NOTE — Assessment & Plan Note (Signed)
PSA stable. Declines DRE today. No fmhx.

## 2015-06-15 NOTE — Assessment & Plan Note (Signed)
Reviewed labwork with patient. LDL at goal, triglycerides too high - encouraged avoiding added sugars in diet.

## 2015-07-13 ENCOUNTER — Encounter: Payer: Self-pay | Admitting: Cardiovascular Disease

## 2015-07-20 ENCOUNTER — Encounter: Payer: Self-pay | Admitting: Cardiovascular Disease

## 2015-07-20 ENCOUNTER — Ambulatory Visit (INDEPENDENT_AMBULATORY_CARE_PROVIDER_SITE_OTHER): Payer: Medicare Other | Admitting: Cardiovascular Disease

## 2015-07-20 VITALS — BP 144/80 | HR 55 | Ht 71.0 in | Wt 196.8 lb

## 2015-07-20 DIAGNOSIS — E785 Hyperlipidemia, unspecified: Secondary | ICD-10-CM | POA: Diagnosis not present

## 2015-07-20 DIAGNOSIS — I10 Essential (primary) hypertension: Secondary | ICD-10-CM | POA: Diagnosis not present

## 2015-07-20 DIAGNOSIS — I2510 Atherosclerotic heart disease of native coronary artery without angina pectoris: Secondary | ICD-10-CM

## 2015-07-20 NOTE — Patient Instructions (Signed)

## 2015-07-20 NOTE — Progress Notes (Signed)
Cardiology Office Note Date:  07/20/2015   ID:  JEHLANI ROCKEY, DOB 02/04/1946, MRN [id number]  PCP:  Ria Bush, MD  Cardiologist:  Sherren Mocha, MD    Chief Complaint  Patient presents with  . Follow-up     History of Present Illness: Christian Kim is a 70 y.o. male who presents for follow-up of coronary artery disease. He initially presented in 2008 with a non-ST elevation infarct. At that time he underwent complex PCI the left circumflex and a long area of total occlusion was treated with overlapping drug-eluting stents. He then underwent staged PCI of the right coronary artery during that same hospital admission. He was noted to have moderate LAD stenosis and this is been managed medically.  The patient is doing fine. 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 He has no complaints today. Today, he denies symptoms of palpitations, chest pain, shortness of breath, orthopnea, PND, lower extremity edema, dizziness, or syncope.   Past Medical History  Diagnosis Date  . Hyperlipidemia   . Hypertension   . CAD (coronary artery disease) 2008    stent  . Choledocholithiasis     recurrent/multiple ERCPs with stone extraction and sphincterotomy   . Gout 2013    dx by crystal analysis  . GERD (gastroesophageal reflux disease)   . Myocardial infarction ([place]) 12-2006  . CAP (community acquired pneumonia) 03/07/2015  . CHOLEDOCHOLITHIASIS 09/20/2007    Qualifier: Diagnosis of  By: Macie Burows      Past Surgical History  Procedure Laterality Date  . Cholecystectomy  2006    w/ revision 2/2 persistent gallstones  . Nasal septum surgery    . Coronary angioplasty with stent placement  2008    stent, Dr. Hyman Hopes  . Colonoscopy  06/2013    1 polyp, rpt 5 yrs [name])    Current Outpatient Prescriptions  Medication Sig Dispense Refill  . amLODipine (NORVASC) 10 MG tablet Take 1 tablet (10 mg total) by mouth daily. 90 tablet 3  . aspirin 81 MG tablet Take 81 mg  by mouth daily.      [name] atorvastatin (LIPITOR) 20 MG tablet Take 1 tablet (20 mg total) by mouth daily. 90 tablet 3  . colchicine 0.6 MG tablet Take 0.6 mg by mouth daily as needed (gout).    [name] ibuprofen (ADVIL,MOTRIN) 200 MG tablet Take 200 mg by mouth every 6 (six) hours as needed for pain.    [name] lisinopril (PRINIVIL,ZESTRIL) 40 MG tablet take 1 tablet by mouth daily 90 tablet 3  . metoprolol succinate (TOPROL-XL) 50 MG 24 hr tablet take 1 tablet by mouth once daily WITH OR IMMEDIATELY FOLLOWING A MEAL 90 tablet 3  . omeprazole (PRILOSEC OTC) 20 MG tablet Take 1 tablet (20 mg total) by mouth daily. 90 tablet 3  . ursodiol (ACTIGALL) 300 MG capsule take 2 capsules by mouth twice a day 360 capsule 2   No current facility-administered medications for this visit.    Allergies:   Allopurinol   Social History:  The patient  reports that he has never smoked. He has never used smokeless tobacco. He reports that he drinks alcohol. He reports that he does not use illicit drugs.   Family History:  The patient's family history includes Liver cancer in his mother; Lung cancer in his mother. There is no history of Colon cancer, Esophageal cancer, Stomach cancer, or Rectal cancer.    ROS:  Please see the history of present illness. All other systems  are reviewed and negative.    PHYSICAL EXAM: VS:  BP 144/80 mmHg  Pulse 55  Ht 5\' 11"  (1.803 m)  Wt 89.268 kg (196 lb 12.8 oz)  BMI 27.46 kg/m2 , BMI Body mass index is 27.46 kg/(m^2). GEN: Well nourished, well developed, in no acute distress HEENT: normal Neck: no JVD, no masses. No carotid bruits Cardiac: RRR without murmur or gallop                Respiratory:  clear to auscultation bilaterally, normal work of breathing GI: soft, nontender, nondistended, + BS MS: no deformity or atrophy Ext: no pretibial edema, pedal pulses 2+= bilaterally Skin: warm and dry, no rash Neuro:  Strength and sensation are intact Psych: euthymic mood, full  affect  EKG:  EKG is ordered today. The ekg ordered today shows sinus bradycardia 55 bpm, otherwise within normal limits.  Recent Labs: 06/09/2015: ALT 18; BUN 25*; Creatinine, Ser 1.44; Potassium 4.2; Sodium 142   Lipid Panel     Component Value Date/Time   CHOL 126 06/09/2015 0841   TRIG 233.0* 06/09/2015 0841   HDL 34.80* 06/09/2015 0841   CHOLHDL 4 06/09/2015 0841   VLDL 46.6* 06/09/2015 0841   LDLCALC 36 09/16/2008 0813   LDLDIRECT 68.0 06/09/2015 0841      Wt Readings from Last 3 Encounters:  07/20/15 89.268 kg (196 lb 12.8 oz)  06/14/15 88.905 kg (196 lb)  03/16/15 86.864 kg (191 lb 8 oz)    ASSESSMENT AND PLAN: 1.  CAD, native vessel, without angina: The patient is doing quite well without any symptoms of angina. He maintains an active lifestyle. His medications are reviewed and no changes recommended today.  2. Essential hypertension: Blood pressure is well controlled on a combination of amlodipine, lisinopril, and metoprolol.  3. Hyperlipidemia: Treated with atorvastatin. Most recent labs are reviewed as above.  Current medicines are reviewed with the patient today.  The patient does not have concerns regarding medicines.  Labs/ tests ordered today include:  No orders of the defined types were placed in this encounter.    Disposition:   FU one year  Signed, Sherren Mocha, MD  07/20/2015 11:47 AM    [facility] [address], [place], [place]  [address] Phone: [phone]; Fax: [phone]

## 2015-08-31 ENCOUNTER — Telehealth: Payer: Self-pay

## 2015-08-31 ENCOUNTER — Other Ambulatory Visit: Payer: Self-pay | Admitting: Family Medicine

## 2015-08-31 MED ORDER — URSODIOL 300 MG PO CAPS
600.0000 mg | ORAL_CAPSULE | Freq: Two times a day (BID) | ORAL | Status: DC
Start: 2015-08-31 — End: 2016-08-16

## 2015-08-31 NOTE — Telephone Encounter (Signed)
Fax request for refills on Ursodiol 300mg  2 po bid #[phone]. Last office visit for 06-05-2013. Pt has no current follow up. Please advise on refills.

## 2015-08-31 NOTE — Telephone Encounter (Signed)
Refilled for one year as directed by Dr [name].

## 2015-08-31 NOTE — Telephone Encounter (Signed)
Yes, okay to refill for 1 year

## 2015-09-07 ENCOUNTER — Other Ambulatory Visit: Payer: Self-pay | Admitting: Family Medicine

## 2016-05-11 ENCOUNTER — Other Ambulatory Visit: Payer: Self-pay | Admitting: Family Medicine

## 2016-05-11 DIAGNOSIS — K219 Gastro-esophageal reflux disease without esophagitis: Secondary | ICD-10-CM

## 2016-05-18 ENCOUNTER — Other Ambulatory Visit: Payer: Self-pay | Admitting: Family Medicine

## 2016-06-14 ENCOUNTER — Other Ambulatory Visit: Payer: Self-pay | Admitting: Family Medicine

## 2016-06-14 ENCOUNTER — Ambulatory Visit: Payer: Medicare Other

## 2016-06-14 DIAGNOSIS — Z1159 Encounter for screening for other viral diseases: Secondary | ICD-10-CM

## 2016-06-14 DIAGNOSIS — E785 Hyperlipidemia, unspecified: Secondary | ICD-10-CM

## 2016-06-14 DIAGNOSIS — R972 Elevated prostate specific antigen [PSA]: Secondary | ICD-10-CM

## 2016-06-14 DIAGNOSIS — N289 Disorder of kidney and ureter, unspecified: Secondary | ICD-10-CM

## 2016-06-14 DIAGNOSIS — M1A9XX0 Chronic gout, unspecified, without tophus (tophi): Secondary | ICD-10-CM

## 2016-06-16 ENCOUNTER — Other Ambulatory Visit: Payer: Self-pay | Admitting: Family Medicine

## 2016-06-16 DIAGNOSIS — K219 Gastro-esophageal reflux disease without esophagitis: Secondary | ICD-10-CM

## 2016-06-19 ENCOUNTER — Ambulatory Visit (INDEPENDENT_AMBULATORY_CARE_PROVIDER_SITE_OTHER): Payer: Medicare Other

## 2016-06-19 ENCOUNTER — Ambulatory Visit: Payer: Medicare Other

## 2016-06-19 VITALS — BP 138/80 | HR 57 | Temp 97.9°F | Ht 69.5 in | Wt 199.5 lb

## 2016-06-19 DIAGNOSIS — N289 Disorder of kidney and ureter, unspecified: Secondary | ICD-10-CM | POA: Diagnosis not present

## 2016-06-19 DIAGNOSIS — E785 Hyperlipidemia, unspecified: Secondary | ICD-10-CM

## 2016-06-19 DIAGNOSIS — Z1159 Encounter for screening for other viral diseases: Secondary | ICD-10-CM

## 2016-06-19 DIAGNOSIS — R972 Elevated prostate specific antigen [PSA]: Secondary | ICD-10-CM

## 2016-06-19 DIAGNOSIS — Z0000 Encounter for general adult medical examination without abnormal findings: Secondary | ICD-10-CM | POA: Diagnosis not present

## 2016-06-19 DIAGNOSIS — M1A9XX0 Chronic gout, unspecified, without tophus (tophi): Secondary | ICD-10-CM | POA: Diagnosis not present

## 2016-06-19 LAB — COMPREHENSIVE METABOLIC PANEL
ALT: 20 U/L (ref 0–53)
AST: 17 U/L (ref 0–37)
Albumin: 4.7 g/dL (ref 3.5–5.2)
Alkaline Phosphatase: 114 U/L (ref 39–117)
BUN: 18 mg/dL (ref 6–23)
CO2: 32 mEq/L (ref 19–32)
Calcium: 9.7 mg/dL (ref 8.4–10.5)
Chloride: 102 mEq/L (ref 96–112)
Creatinine, Ser: 1.35 mg/dL (ref 0.40–1.50)
GFR: 55.36 mL/min — ABNORMAL LOW (ref >60.00)
Glucose, Bld: 94 mg/dL (ref 70–99)
Potassium: 4.4 mEq/L (ref 3.5–5.1)
Sodium: 139 mEq/L (ref 135–145)
Total Bilirubin: 1.3 mg/dL — ABNORMAL HIGH (ref 0.2–1.2)
Total Protein: 7.5 g/dL (ref 6.0–8.3)

## 2016-06-19 LAB — CBC WITH DIFFERENTIAL/PLATELET
BASOS ABS: 0 10*3/uL (ref 0.0–0.1)
Basophils Relative: 0.3 % (ref 0.0–3.0)
EOS PCT: 3.9 % (ref 0.0–5.0)
Eosinophils Absolute: 0.3 10*3/uL (ref 0.0–0.7)
HEMATOCRIT: 44.1 % (ref 39.0–52.0)
Hemoglobin: 15.3 g/dL (ref 13.0–17.0)
LYMPHS PCT: 30.5 % (ref 12.0–46.0)
Lymphs Abs: 2 10*3/uL (ref 0.7–4.0)
MCHC: 34.8 g/dL (ref 30.0–36.0)
MCV: 92.6 fl (ref 78.0–100.0)
MONOS PCT: 9.3 % (ref 3.0–12.0)
Monocytes Absolute: 0.6 10*3/uL (ref 0.1–1.0)
NEUTROS ABS: 3.6 10*3/uL (ref 1.4–7.7)
Neutrophils Relative %: 56 % (ref 43.0–77.0)
PLATELETS: 194 10*3/uL (ref 150.0–400.0)
RBC: 4.76 Mil/uL (ref 4.22–5.81)
RDW: 12.8 % (ref 11.5–15.5)
WBC: 6.4 10*3/uL (ref 4.0–10.5)

## 2016-06-19 LAB — LIPID PANEL
CHOL/HDL RATIO: 4
CHOLESTEROL: 131 mg/dL (ref 0–200)
HDL: 35.7 mg/dL — ABNORMAL LOW (ref >39.00)
NONHDL: 95.49
Triglycerides: 272 mg/dL — ABNORMAL HIGH (ref 0.0–149.0)
VLDL: 54.4 mg/dL — AB (ref 0.0–40.0)

## 2016-06-19 LAB — URIC ACID: Uric Acid, Serum: 7 mg/dL (ref 4.0–7.8)

## 2016-06-19 LAB — PSA: PSA: 1 ng/mL (ref 0.10–4.00)

## 2016-06-19 LAB — LDL CHOLESTEROL, DIRECT: Direct LDL: 63 mg/dL

## 2016-06-19 LAB — TSH: TSH: 2.83 u[IU]/mL (ref 0.35–4.50)

## 2016-06-19 NOTE — Progress Notes (Signed)
Subjective:   Christian Kim is a 71 y.o. male who presents for Medicare Annual/Subsequent preventive examination.  Review of Systems:  N/A Cardiac Risk Factors include: advanced age (>12men, >29 women);dyslipidemia;male gender;hypertension     Objective:    Vitals: BP 138/80 (BP Location: Left Arm, Patient Position: Sitting, Cuff Size: Normal)   Pulse (!) 57   Temp 97.9 F (36.6 C) (Oral)   Ht 5' 9.5" (1.765 m) Comment: no shoes  Wt 199 lb 8 oz (90.5 kg)   SpO2 96%   BMI 29.04 kg/m   Body mass index is 29.04 kg/m.  Tobacco History  Smoking Status  . Never Smoker  Smokeless Tobacco  . Never Used     Counseling given: No   Past Medical History:  Diagnosis Date  . CAD (coronary artery disease) 2008   stent  . CAP (community acquired pneumonia) 03/07/2015  . Choledocholithiasis    recurrent/multiple ERCPs with stone extraction and sphincterotomy   . CHOLEDOCHOLITHIASIS 09/20/2007   Qualifier: Diagnosis of  By: Roxan Hockey, [place]    . GERD (gastroesophageal reflux disease)   . Gout 2013   dx by crystal analysis  . Hyperlipidemia   . Hypertension   . Myocardial infarction 12-2006   Past Surgical History:  Procedure Laterality Date  . CHOLECYSTECTOMY  2006   w/ revision 2/2 persistent gallstones  . COLONOSCOPY  06/2013   1 polyp, rpt 5 yrs [name])  . CORONARY ANGIOPLASTY WITH STENT PLACEMENT  2008   stent, Dr. Hyman Hopes  . NASAL SEPTUM SURGERY     Family History  Problem Relation Age of Onset  . Liver cancer Mother     unknown primary origin  . Lung cancer Mother   . Colon cancer Neg Hx   . Esophageal cancer Neg Hx   . Stomach cancer Neg Hx   . Rectal cancer Neg Hx    History  Sexual Activity  . Sexual activity: Yes    Outpatient Encounter Prescriptions as of 06/19/2016  Medication Sig  . amLODipine (NORVASC) 10 MG tablet take 1 tablet by mouth daily  . aspirin 81 MG tablet Take 81 mg by mouth daily.    [name] atorvastatin (LIPITOR) 20 MG tablet  take 1 tablet by mouth daily  . colchicine 0.6 MG tablet Take 0.6 mg by mouth daily as needed (gout).  [name] ibuprofen (ADVIL,MOTRIN) 200 MG tablet Take 200 mg by mouth every 6 (six) hours as needed for pain.  [name] lisinopril (PRINIVIL,ZESTRIL) 40 MG tablet take 1 tablet by mouth once daily  . metoprolol succinate (TOPROL-XL) 50 MG 24 hr tablet take 1 tablet by mouth once daily with or immediately FOLLOWING A MEAL  . omeprazole (PRILOSEC) 20 MG capsule take 1 capsule by mouth once daily  . ursodiol (ACTIGALL) 300 MG capsule Take 2 capsules (600 mg total) by mouth 2 (two) times daily.   No facility-administered encounter medications on file as of 06/19/2016.     Activities of Daily Living In your present state of health, do you have any difficulty performing the following activities: 06/19/2016  Hearing? Y  Vision? N  Difficulty concentrating or making decisions? N  Walking or climbing stairs? N  Dressing or bathing? N  Doing errands, shopping? N  Preparing Food and eating ? N  Using the Toilet? N  In the past six months, have you accidently leaked urine? N  Do you have problems with loss of bowel control? N  Managing your Medications? N  Managing  your Finances? N  Housekeeping or managing your Housekeeping? N  Some recent data might be hidden    Patient Care Team: Ria Bush, MD as PCP - General (Family Medicine)   Assessment:     Hearing Screening   125Hz  250Hz  500Hz  1000Hz  2000Hz  3000Hz  4000Hz  6000Hz  8000Hz   Right ear:   40 40 40  0    Left ear:   40 40 40  0      Visual Acuity Screening   Right eye Left eye Both eyes  Without correction: 20/30-1 20/30-1 20/30-1  With correction:       Exercise Activities and Dietary recommendations Current Exercise Habits: The patient has a physically strenous job, but has no regular exercise apart from work. [place] Exercise limited by: None identified  Goals    . Increase physical activity          Starting 06/19/16, I  will continue to make healthy food choices by eating lean meat, vegetables, and drinking dairy.       Fall Risk Fall Risk  06/19/2016 06/14/2015 06/07/2014 06/05/2013  Falls in the past year? No No No No   Depression Screen PHQ 2/9 Scores 06/19/2016 06/14/2015 06/07/2014 06/05/2013  PHQ - 2 Score 0 0 0 0    Cognitive Function MMSE - Mini Mental State Exam 06/19/2016  Orientation to time 5  Orientation to Place 5  Registration 3  Attention/ Calculation 0  Recall 3  Language- name 2 objects 0  Language- repeat 1  Language- follow 3 step command 3  Language- read & follow direction 0  Write a sentence 0  Copy design 0  Total score 20     PLEASE NOTE: A Mini-Cog screen was completed. Maximum score is 20. A value of 0 denotes this part of Folstein MMSE was not completed or the patient failed this part of the Mini-Cog screening.   Mini-Cog Screening Orientation to Time - Max 5 pts Orientation to Place - Max 5 pts Registration - Max 3 pts Recall - Max 3 pts Language Repeat - Max 1 pts Language Follow 3 Step Command - Max 3 pts     Immunization History  Administered Date(s) Administered  . Influenza Whole 03/09/2009, 02/26/2011, 02/26/2012  . Influenza, High Dose Seasonal PF 02/03/2014, 04/15/2015  . Influenza,inj,Quad PF,36+ Mos 03/04/2013  . Influenza-Unspecified 02/26/2016  . Pneumococcal Conjugate-13 06/07/2014  . Pneumococcal Polysaccharide-23 01/09/2011  . Td 01/29/2002  . Tdap 06/14/2015  . Zoster 01/09/2011   Screening Tests Health Maintenance  Topic Date Due  . COLONOSCOPY  07/16/2018  . DTaP/Tdap/Td (2 - Td) 06/13/2025  . TETANUS/TDAP  06/13/2025  . INFLUENZA VACCINE  Addressed  . ZOSTAVAX  Completed  . Hepatitis C Screening  Completed  . PNA vac Low Risk Adult  Completed      Plan:     I have personally reviewed and addressed the Medicare Annual Wellness questionnaire and have noted the following in the patient's chart:  A. Medical and social history B. Use  of alcohol, tobacco or illicit drugs  C. Current medications and supplements D. Functional ability and status E.  Nutritional status F.  Physical activity G. Advance directives H. List of other physicians I.  Hospitalizations, surgeries, and ER visits in previous 12 months J.  [place] to include hearing, vision, cognitive, depression L. Referrals and appointments - none  In addition, I have reviewed and discussed with patient certain preventive protocols, quality metrics, and best practice  recommendations. A written personalized care plan for preventive services as well as general preventive health recommendations were provided to patient.  See attached scanned questionnaire for additional information.   Signed,   Lindell Noe, MHA, BS, LPN Health Coach

## 2016-06-19 NOTE — Progress Notes (Signed)
Pre visit review using our clinic review tool, if applicable. No additional management support is needed unless otherwise documented below in the visit note. 

## 2016-06-19 NOTE — Progress Notes (Signed)
PCP notes:   Health maintenance:  Hep C screening - completed Flu vaccine - per pt, completed in Oct 2017  Abnormal screenings:   Hearing - failed  Patient concerns:   Pt has complaint of left arm pain. Onset in August 2017. Pain medication OTC has been effective. Cause is possible injury from lifting up 80 lb pet multiple times.   Nurse concerns:  None  Next PCP appt:   07/02/16 @ 1130

## 2016-06-19 NOTE — Patient Instructions (Signed)
Christian Kim , Thank you for taking time to come for your Medicare Wellness Visit. I appreciate your ongoing commitment to your health goals. Please review the following plan we discussed and let me know if I can assist you in the future.   These are the goals we discussed: Goals    . Increase physical activity          Starting 06/19/16, I will continue to make healthy food choices by eating lean meat, vegetables, and drinking dairy.        This is a list of the screening recommended for you and due dates:  Health Maintenance  Topic Date Due  . Colon Cancer Screening  07/16/2018  . DTaP/Tdap/Td vaccine (2 - Td) 06/13/2025  . Tetanus Vaccine  06/13/2025  . Flu Shot  Addressed  . Shingles Vaccine  Completed  .  Hepatitis C: One time screening is recommended by Center for Disease Control  (CDC) for  adults born from [birth year] through 1965.   Completed  . Pneumonia vaccines  Completed   Preventive Care for Adults  A healthy lifestyle and preventive care can promote health and wellness. Preventive health guidelines for adults include the following key practices.  . A routine yearly physical is a good way to check with your health care provider about your health and preventive screening. It is a chance to share any concerns and updates on your health and to receive a thorough exam.  . Visit your dentist for a routine exam and preventive care every 6 months. Brush your teeth twice a day and floss once a day. Good oral hygiene prevents tooth decay and gum disease.  . The frequency of eye exams is based on your age, health, family medical history, use  of contact lenses, and other factors. Follow your health care provider's ecommendations for frequency of eye exams.  . Eat a healthy diet. Foods like vegetables, fruits, whole grains, low-fat dairy products, and lean protein foods contain the nutrients you need without too many calories. Decrease your intake of foods high in solid fats, added  sugars, and salt. Eat the right amount of calories for you. Get information about a proper diet from your health care provider, if necessary.  . Regular physical exercise is one of the most important things you can do for your health. Most adults should get at least 150 minutes of moderate-intensity exercise (any activity that increases your heart rate and causes you to sweat) each week. In addition, most adults need muscle-strengthening exercises on 2 or more days a week.  Silver Sneakers may be a benefit available to you. To determine eligibility, you may visit the website: www.silversneakers.com or contact program at (631)361-6510 Mon-Fri between 8AM-8PM.   . Maintain a healthy weight. The body mass index (BMI) is a screening tool to identify possible weight problems. It provides an estimate of body fat based on height and weight. Your health care provider can find your BMI and can help you achieve or maintain a healthy weight.   For adults 20 years and older: ? A BMI below 18.5 is considered underweight. ? A BMI of 18.5 to 24.9 is normal. ? A BMI of 25 to 29.9 is considered overweight. ? A BMI of 30 and above is considered obese.   . Maintain normal blood lipids and cholesterol levels by exercising and minimizing your intake of saturated fat. Eat a balanced diet with plenty of fruit and vegetables. Blood tests for lipids and cholesterol should begin  at age 68 and be repeated every 5 years. If your lipid or cholesterol levels are high, you are over 50, or you are at high risk for heart disease, you may need your cholesterol levels checked more frequently. Ongoing high lipid and cholesterol levels should be treated with medicines if diet and exercise are not working.  . If you smoke, find out from your health care provider how to quit. If you do not use tobacco, please do not start.  . If you choose to drink alcohol, please do not consume more than 2 drinks per day. One drink is considered to  be 12 ounces (355 mL) of beer, 5 ounces (148 mL) of wine, or 1.5 ounces (44 mL) of liquor.  . If you are 35-24 years old, ask your health care provider if you should take aspirin to prevent strokes.  . Use sunscreen. Apply sunscreen liberally and repeatedly throughout the day. You should seek shade when your shadow is shorter than you. Protect yourself by wearing long sleeves, pants, a wide-brimmed hat, and sunglasses year round, whenever you are outdoors.  . Once a month, do a whole body skin exam, using a mirror to look at the skin on your back. Tell your health care provider of new moles, moles that have irregular borders, moles that are larger than a pencil eraser, or moles that have changed in shape or color.

## 2016-06-20 LAB — HEPATITIS C ANTIBODY: HCV AB: NEGATIVE

## 2016-06-20 NOTE — Progress Notes (Signed)
I reviewed health advisor's note, was available for consultation, and agree with documentation and plan.  

## 2016-07-02 ENCOUNTER — Ambulatory Visit (INDEPENDENT_AMBULATORY_CARE_PROVIDER_SITE_OTHER): Payer: Medicare Other | Admitting: Family Medicine

## 2016-07-02 ENCOUNTER — Encounter: Payer: Self-pay | Admitting: Family Medicine

## 2016-07-02 VITALS — BP 140/62 | HR 55 | Temp 98.2°F | Resp 16 | Ht 69.5 in | Wt 199.5 lb

## 2016-07-02 DIAGNOSIS — Z0000 Encounter for general adult medical examination without abnormal findings: Secondary | ICD-10-CM | POA: Diagnosis not present

## 2016-07-02 DIAGNOSIS — I10 Essential (primary) hypertension: Secondary | ICD-10-CM

## 2016-07-02 DIAGNOSIS — K831 Obstruction of bile duct: Secondary | ICD-10-CM

## 2016-07-02 DIAGNOSIS — M1A9XX0 Chronic gout, unspecified, without tophus (tophi): Secondary | ICD-10-CM

## 2016-07-02 DIAGNOSIS — N289 Disorder of kidney and ureter, unspecified: Secondary | ICD-10-CM

## 2016-07-02 DIAGNOSIS — I2510 Atherosclerotic heart disease of native coronary artery without angina pectoris: Secondary | ICD-10-CM

## 2016-07-02 DIAGNOSIS — K219 Gastro-esophageal reflux disease without esophagitis: Secondary | ICD-10-CM

## 2016-07-02 DIAGNOSIS — Z7189 Other specified counseling: Secondary | ICD-10-CM | POA: Diagnosis not present

## 2016-07-02 DIAGNOSIS — E785 Hyperlipidemia, unspecified: Secondary | ICD-10-CM | POA: Diagnosis not present

## 2016-07-02 MED ORDER — OMEPRAZOLE 20 MG PO CPDR: 20.0000 mg | DELAYED_RELEASE_CAPSULE | Freq: Every day | ORAL | 3 refills | Status: DC

## 2016-07-02 MED ORDER — LISINOPRIL 40 MG PO TABS: 40.0000 mg | ORAL_TABLET | Freq: Every day | ORAL | 3 refills | Status: DC

## 2016-07-02 NOTE — Assessment & Plan Note (Signed)
Chronic, stable. Continue current regimen. 

## 2016-07-02 NOTE — Assessment & Plan Note (Signed)
Stable, asxs. Appreciate cards care. Continue aspirin, statin.

## 2016-07-02 NOTE — Assessment & Plan Note (Addendum)
Chronic, stable. LDL at goal. Trig too high - discussed healthy diet changes to improve triglyercide levels.

## 2016-07-02 NOTE — Assessment & Plan Note (Signed)
Managed with ursodiol daily.

## 2016-07-02 NOTE — Assessment & Plan Note (Signed)
Urate elevated but no recent gout flares - no changes indicated.

## 2016-07-02 NOTE — Assessment & Plan Note (Addendum)
Advanced directives:  Would want wife to be HCPOA. To work with lawyer to set this up

## 2016-07-02 NOTE — Assessment & Plan Note (Signed)
Stable period. Continue to monitor.  

## 2016-07-02 NOTE — Assessment & Plan Note (Signed)
Preventative protocols reviewed and updated unless pt declined. Discussed healthy diet and lifestyle.  

## 2016-07-02 NOTE — Patient Instructions (Signed)
You are doing well today. Continue current medicines.  Triglyceride levels were too high - work on increasing fatty fish in diet (salmon, tuna, sardines, trout).  Return as needed or in 1 year for next physical.  Health Maintenance, Male A healthy lifestyle and preventative care can promote health and wellness.  Maintain regular health, dental, and eye exams.  Eat a healthy diet. Foods like vegetables, fruits, whole grains, low-fat dairy products, and lean protein foods contain the nutrients you need and are low in calories. Decrease your intake of foods high in solid fats, added sugars, and salt. Get information about a proper diet from your health care provider, if necessary.  Regular physical exercise is one of the most important things you can do for your health. Most adults should get at least 150 minutes of moderate-intensity exercise (any activity that increases your heart rate and causes you to sweat) each week. In addition, most adults need muscle-strengthening exercises on 2 or more days a week.   Maintain a healthy weight. The body mass index (BMI) is a screening tool to identify possible weight problems. It provides an estimate of body fat based on height and weight. Your health care provider can find your BMI and can help you achieve or maintain a healthy weight. For males 20 years and older:  A BMI below 18.5 is considered underweight.  A BMI of 18.5 to 24.9 is normal.  A BMI of 25 to 29.9 is considered overweight.  A BMI of 30 and above is considered obese.  Maintain normal blood lipids and cholesterol by exercising and minimizing your intake of saturated fat. Eat a balanced diet with plenty of fruits and vegetables. Blood tests for lipids and cholesterol should begin at age 23 and be repeated every 5 years. If your lipid or cholesterol levels are high, you are over age 43, or you are at high risk for heart disease, you may need your cholesterol levels checked more  frequently.Ongoing high lipid and cholesterol levels should be treated with medicines if diet and exercise are not working.  If you smoke, find out from your health care provider how to quit. If you do not use tobacco, do not start.  Lung cancer screening is recommended for adults aged 59-80 years who are at high risk for developing lung cancer because of a history of smoking. A yearly low-dose CT scan of the lungs is recommended for people who have at least a 30-pack-year history of smoking and are current smokers or have quit within the past 15 years. A pack year of smoking is smoking an average of 1 pack of cigarettes a day for 1 year (for example, a 30-pack-year history of smoking could mean smoking 1 pack a day for 30 years or 2 packs a day for 15 years). Yearly screening should continue until the smoker has stopped smoking for at least 15 years. Yearly screening should be stopped for people who develop a health problem that would prevent them from having lung cancer treatment.  If you choose to drink alcohol, do not have more than 2 drinks per day. One drink is considered to be 12 oz (360 mL) of beer, 5 oz (150 mL) of wine, or 1.5 oz (45 mL) of liquor.  Avoid the use of street drugs. Do not share needles with anyone. Ask for help if you need support or instructions about stopping the use of drugs.  High blood pressure causes heart disease and increases the risk of stroke. High  blood pressure is more likely to develop in:  People who have blood pressure in the end of the normal range (100-139/85-89 mm Hg).  People who are overweight or obese.  People who are African American.  If you are 61-39 years of age, have your blood pressure checked every 3-5 years. If you are 1 years of age or older, have your blood pressure checked every year. You should have your blood pressure measured twice-once when you are at a hospital or clinic, and once when you are not at a hospital or clinic. Record the  average of the two measurements. To check your blood pressure when you are not at a hospital or clinic, you can use:  An automated blood pressure machine at a pharmacy.  A home blood pressure monitor.  If you are 56-53 years old, ask your health care provider if you should take aspirin to prevent heart disease.  Diabetes screening involves taking a blood sample to check your fasting blood sugar level. This should be done once every 3 years after age 90 if you are at a normal weight and without risk factors for diabetes. Testing should be considered at a younger age or be carried out more frequently if you are overweight and have at least 1 risk factor for diabetes.  Colorectal cancer can be detected and often prevented. Most routine colorectal cancer screening begins at the age of 52 and continues through age 76. However, your health care provider may recommend screening at an earlier age if you have risk factors for colon cancer. On a yearly basis, your health care provider may provide home test kits to check for hidden blood in the stool. A small camera at the end of a tube may be used to directly examine the colon (sigmoidoscopy or colonoscopy) to detect the earliest forms of colorectal cancer. Talk to your health care provider about this at age 71 when routine screening begins. A direct exam of the colon should be repeated every 5-10 years through age 85, unless early forms of precancerous polyps or small growths are found.  People who are at an increased risk for hepatitis B should be screened for this virus. You are considered at high risk for hepatitis B if:  You were born in a country where hepatitis B occurs often. Talk with your health care provider about which countries are considered high risk.  Your parents were born in a high-risk country and you have not received a shot to protect against hepatitis B (hepatitis B vaccine).  You have HIV or AIDS.  You use needles to inject street  drugs.  You live with, or have sex with, someone who has hepatitis B.  You are a man who has sex with other men (MSM).  You get hemodialysis treatment.  You take certain medicines for conditions like cancer, organ transplantation, and autoimmune conditions.  Hepatitis C blood testing is recommended for all people born from [birth year] through 1965 and any individual with known risk factors for hepatitis C.  Healthy men should no longer receive prostate-specific antigen (PSA) blood tests as part of routine cancer screening. Talk to your health care provider about prostate cancer screening.  Testicular cancer screening is not recommended for adolescents or adult males who have no symptoms. Screening includes self-exam, a health care provider exam, and other screening tests. Consult with your health care provider about any symptoms you have or any concerns you have about testicular cancer.  Practice safe sex. Use condoms and  avoid high-risk sexual practices to reduce the spread of sexually transmitted infections (STIs).  You should be screened for STIs, including gonorrhea and chlamydia if:  You are sexually active and are younger than 24 years.  You are older than 24 years, and your health care provider tells you that you are at risk for this type of infection.  Your sexual activity has changed since you were last screened, and you are at an increased risk for chlamydia or gonorrhea. Ask your health care provider if you are at risk.  If you are at risk of being infected with HIV, it is recommended that you take a prescription medicine daily to prevent HIV infection. This is called pre-exposure prophylaxis (PrEP). You are considered at risk if:  You are a man who has sex with other men (MSM).  You are a heterosexual man who is sexually active with multiple partners.  You take drugs by injection.  You are sexually active with a partner who has HIV.  Talk with your health care provider about  whether you are at high risk of being infected with HIV. If you choose to begin PrEP, you should first be tested for HIV. You should then be tested every 3 months for as [place] as you are taking PrEP.  Use sunscreen. Apply sunscreen liberally and repeatedly throughout the day. You should seek shade when your shadow is shorter than you. Protect yourself by wearing Lange sleeves, pants, a wide-brimmed hat, and sunglasses year round whenever you are outdoors.  Tell your health care provider of new moles or changes in moles, especially if there is a change in shape or color. Also, tell your health care provider if a mole is larger than the size of a pencil eraser.  A one-time screening for abdominal aortic aneurysm (AAA) and surgical repair of large AAAs by ultrasound is recommended for men aged 68-75 years who are current or former smokers.  Stay current with your vaccines (immunizations). This information is not intended to replace advice given to you by your health care provider. Make sure you discuss any questions you have with your health care provider. Document Released: 11/10/2007 Document Revised: 06/04/2014 Document Reviewed: 02/15/2015 Elsevier Interactive Patient Education  2017 [employer].

## 2016-07-02 NOTE — Progress Notes (Signed)
BP 140/62 (BP Location: Left Arm, Patient Position: Sitting, Cuff Size: Large)   Pulse (!) 55   Temp 98.2 F (36.8 C) (Oral)   Resp 16   Ht 5' 9.5" (1.765 m)   Wt 199 lb 8 oz (90.5 kg)   SpO2 95%   BMI 29.04 kg/m    CC: CPE Subjective:    Patient ID: Christian Kim, male    DOB: October 26, 1945, 71 y.o.   MRN: [id number]  HPI: Christian Kim is a 71 y.o. male presenting on 07/02/2016 for Annual Exam   Saw Katha Cabal last week for medicare wellness visit. Note reviewed.   L forearm pain ongoing since 12/2015. May have started after lifting 80lb pet. Treating with OTC analgesic with good relief. Pain at [facility] fossa. Getting better.   Chronic ursodiol use 2/2 recurrent choledocholithiasis after cholecystectomy. Worse with fatty greasy foods. Prilosec has also significantly helped.   Preventative: COLONOSCOPY Date: 06/2013 1 polyp, rpt 5 yrs [name])  Prostate cancer screening - has had routinely checked but not in last few years. Denies nocturia or LUTS sxs. Deferred today.  Flu shot yearly.  Pneumovax 2012, prevnar 05/2014 Tdap 05/2015 Zostavax 2012.  Advanced directives:  Would want wife to be HCPOA. To work with lawyer to set this up Seat belt use discussed Sunscreen use discussed. No changing moles on skin.  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 Interim medical history since our last visit reviewed. Allergies and medications reviewed and updated. Current Outpatient Prescriptions on File Prior to Visit  Medication Sig  . amLODipine (NORVASC) 10 MG tablet take 1 tablet by mouth daily  . aspirin 81 MG tablet Take 81 mg by mouth daily.    [name] atorvastatin (LIPITOR) 20 MG  tablet take 1 tablet by mouth daily  . colchicine 0.6 MG tablet Take 0.6 mg by mouth daily as needed (gout).  [name] ibuprofen (ADVIL,MOTRIN) 200 MG tablet Take 200 mg by mouth every 6 (six) hours as needed for pain.  . metoprolol succinate (TOPROL-XL) 50 MG 24 hr tablet take 1 tablet by mouth once daily with or immediately FOLLOWING A MEAL  . ursodiol (ACTIGALL) 300 MG capsule Take 2 capsules (600 mg total) by mouth 2 (two) times daily.   No current facility-administered medications on file prior to visit.     Review of Systems  Constitutional: Negative for activity change, appetite change, chills, fatigue, fever and unexpected weight change.  HENT: Negative for hearing loss.   Eyes: Negative for visual disturbance.  Respiratory: Negative for cough, chest tightness, shortness of breath and wheezing.   Cardiovascular: Negative for chest pain, palpitations and leg swelling.  Gastrointestinal: Negative for abdominal distention, abdominal pain, blood in stool, constipation, diarrhea, nausea and vomiting.  Genitourinary: Negative for difficulty urinating and hematuria.  Musculoskeletal: Negative for arthralgias, myalgias and neck pain.  Skin: Negative for rash.  Neurological: Negative for dizziness, seizures, syncope and headaches.  Hematological: Negative for adenopathy. Does not bruise/bleed easily.  Psychiatric/Behavioral: Negative for dysphoric mood. The patient is not nervous/anxious.    Per HPI unless specifically indicated in ROS section     Objective:    BP 140/62 (BP Location: Left Arm, Patient Position: Sitting, Cuff Size: Large)  Pulse (!) 55   Temp 98.2 F (36.8 C) (Oral)   Resp 16   Ht 5' 9.5" (1.765 m)   Wt 199 lb 8 oz (90.5 kg)   SpO2 95%   BMI 29.04 kg/m   Wt Readings from Last 3 Encounters:  07/02/16 199 lb 8 oz (90.5 kg)  06/19/16 199 lb 8 oz (90.5 kg)  07/20/15 196 lb 12.8 oz (89.3 kg)    Physical Exam  Constitutional: He is oriented to person, place, and time.  He appears well-developed and well-nourished. No distress.  HENT:  Head: Normocephalic and atraumatic.  Right Ear: Hearing, tympanic membrane, external ear and ear canal normal.  Left Ear: Hearing, tympanic membrane, external ear and ear canal normal.  Nose: Nose normal.  Mouth/Throat: Uvula is midline, oropharynx is clear and moist and mucous membranes are normal. No oropharyngeal exudate, posterior oropharyngeal edema or posterior oropharyngeal erythema.  Eyes: Conjunctivae and EOM are normal. Pupils are equal, round, and reactive to light. No scleral icterus.  Neck: Normal range of motion. Neck supple. Carotid bruit is not present. No thyromegaly present.  Cardiovascular: Normal rate, regular rhythm, normal heart sounds and intact distal pulses.   No murmur heard. Pulses:      Radial pulses are 2+ on the right side, and 2+ on the left side.  Pulmonary/Chest: Effort normal and breath sounds normal. No respiratory distress. He has no wheezes. He has no rales.  Abdominal: Soft. Bowel sounds are normal. He exhibits no distension and no mass. There is no tenderness. There is no rebound and no guarding.  Genitourinary:  Genitourinary Comments: DRE declined  Musculoskeletal: Normal range of motion. He exhibits no edema.  Lymphadenopathy:    He has no cervical adenopathy.  Neurological: He is alert and oriented to person, place, and time.  CN grossly intact, station and gait intact  Skin: Skin is warm and dry. No rash noted.  Psychiatric: He has a normal mood and affect. His behavior is normal. Judgment and thought content normal.  Nursing note and vitals reviewed.  Results for orders placed or performed in visit on 06/19/16  Lipid panel  Result Value Ref Range   Cholesterol 131 0 - 200 mg/dL   Triglycerides 272.0 (H) 0.0 - 149.0 mg/dL   HDL 35.70 (L) >39.00 mg/dL   VLDL 54.4 (H) 0.0 - 40.0 mg/dL   Total CHOL/HDL Ratio 4    NonHDL 95.49   Comprehensive metabolic panel  Result Value Ref  Range   Sodium 139 135 - 145 mEq/L   Potassium 4.4 3.5 - 5.1 mEq/L   Chloride 102 96 - 112 mEq/L   CO2 32 19 - 32 mEq/L   Glucose, Bld 94 70 - 99 mg/dL   BUN 18 6 - 23 mg/dL   Creatinine, Ser 1.35 0.40 - 1.50 mg/dL   Total Bilirubin 1.3 (H) 0.2 - 1.2 mg/dL   Alkaline Phosphatase 114 39 - 117 U/L   AST 17 0 - 37 U/L   ALT 20 0 - 53 U/L   Total Protein 7.5 6.0 - 8.3 g/dL   Albumin 4.7 3.5 - 5.2 g/dL   Calcium 9.7 8.4 - 10.5 mg/dL   GFR 55.36 (L) >60.00 mL/min  TSH  Result Value Ref Range   TSH 2.83 0.35 - 4.50 uIU/mL  PSA  Result Value Ref Range   PSA 1.00 0.10 - 4.00 ng/mL  CBC with Differential/Platelet  Result Value Ref Range   WBC 6.4 4.0 - 10.5 K/uL  RBC 4.76 4.22 - 5.81 Mil/uL   Hemoglobin 15.3 13.0 - 17.0 g/dL   HCT 44.1 39.0 - 52.0 %   MCV 92.6 78.0 - 100.0 fl   MCHC 34.8 30.0 - 36.0 g/dL   RDW 12.8 11.5 - 15.5 %   Platelets 194.0 150.0 - 400.0 K/uL   Neutrophils Relative % 56.0 43.0 - 77.0 %   Lymphocytes Relative 30.5 12.0 - 46.0 %   Monocytes Relative 9.3 3.0 - 12.0 %   Eosinophils Relative 3.9 0.0 - 5.0 %   Basophils Relative 0.3 0.0 - 3.0 %   Neutro Abs 3.6 1.4 - 7.7 K/uL   Lymphs Abs 2.0 0.7 - 4.0 K/uL   Monocytes Absolute 0.6 0.1 - 1.0 K/uL   Eosinophils Absolute 0.3 0.0 - 0.7 K/uL   Basophils Absolute 0.0 0.0 - 0.1 K/uL  Uric acid  Result Value Ref Range   Uric Acid, Serum 7.0 4.0 - 7.8 mg/dL  Hepatitis C antibody  Result Value Ref Range   HCV Ab NEGATIVE NEGATIVE  LDL cholesterol, direct  Result Value Ref Range   Direct LDL 63.0 mg/dL      Assessment & Plan:   Problem List Items Addressed This Visit    Advanced care planning/counseling discussion    Advanced directives:  Would want wife to be HCPOA. To work with lawyer to set this up      Common bile duct (CBD) obstruction    Managed with ursodiol daily.       Coronary atherosclerosis    Stable, asxs. Appreciate cards care. Continue aspirin, statin.      Relevant Medications    lisinopril (PRINIVIL,ZESTRIL) 40 MG tablet   Essential hypertension    Chronic, stable. Continue current regimen.       Relevant Medications   lisinopril (PRINIVIL,ZESTRIL) 40 MG tablet   Gout    Urate elevated but no recent gout flares - no changes indicated.       Health maintenance examination - Primary    Preventative protocols reviewed and updated unless pt declined. Discussed healthy diet and lifestyle.       HLD (hyperlipidemia)    Chronic, stable. LDL at goal. Trig too high - discussed healthy diet changes to improve triglyercide levels.       Relevant Medications   lisinopril (PRINIVIL,ZESTRIL) 40 MG tablet   Renal insufficiency    Stable period. Continue to monitor.        Other Visit Diagnoses    Gastroesophageal reflux disease without esophagitis       Relevant Medications   omeprazole (PRILOSEC) 20 MG capsule       Follow up plan: No Follow-up on file.  Ria Bush, MD

## 2016-08-16 ENCOUNTER — Other Ambulatory Visit: Payer: Self-pay | Admitting: Internal Medicine

## 2016-08-16 ENCOUNTER — Other Ambulatory Visit: Payer: Self-pay | Admitting: Family Medicine

## 2016-08-23 ENCOUNTER — Other Ambulatory Visit: Payer: Self-pay | Admitting: Family Medicine

## 2016-09-05 ENCOUNTER — Encounter: Payer: Self-pay | Admitting: Cardiovascular Disease

## 2016-09-24 ENCOUNTER — Ambulatory Visit (INDEPENDENT_AMBULATORY_CARE_PROVIDER_SITE_OTHER): Payer: Medicare Other | Admitting: Cardiovascular Disease

## 2016-09-24 ENCOUNTER — Encounter (INDEPENDENT_AMBULATORY_CARE_PROVIDER_SITE_OTHER): Payer: Self-pay

## 2016-09-24 ENCOUNTER — Encounter: Payer: Self-pay | Admitting: Cardiovascular Disease

## 2016-09-24 VITALS — BP 150/78 | HR 54 | Ht 71.0 in | Wt 200.4 lb

## 2016-09-24 DIAGNOSIS — I25119 Atherosclerotic heart disease of native coronary artery with unspecified angina pectoris: Secondary | ICD-10-CM

## 2016-09-24 DIAGNOSIS — E785 Hyperlipidemia, unspecified: Secondary | ICD-10-CM | POA: Diagnosis not present

## 2016-09-24 DIAGNOSIS — I10 Essential (primary) hypertension: Secondary | ICD-10-CM

## 2016-09-24 DIAGNOSIS — R079 Chest pain, unspecified: Secondary | ICD-10-CM | POA: Diagnosis not present

## 2016-09-24 NOTE — Patient Instructions (Signed)
Medication Instructions:  Your physician recommends that you continue on your current medications as directed. Please refer to the Current Medication list given to you today.  Labwork: No new orders.   Testing/Procedures: Your physician has requested that you have a stress echocardiogram. For further information please visit [email or web address]. Please follow instruction sheet as given.  Follow-Up: Your physician wants you to follow-up in: 1 YEAR with Dr Burt Knack.  You will receive a reminder letter in the mail two months in advance. If you don't receive a letter, please call our office to schedule the follow-up appointment.   Any Other Special Instructions Will Be Listed Below (If Applicable).  Your physician has requested that you regularly monitor and record your blood pressure readings at home. Please use the same machine at the same time of day to check your readings and record them to bring to your follow-up visit. We would like to see your BP consistently less than 130/80.   If you need a refill on your cardiac medications before your next appointment, please call your pharmacy.

## 2016-09-24 NOTE — Progress Notes (Signed)
Cardiology Office Note Date:  09/25/2016   ID:  Christian Kim, DOB 04-02-1946, MRN [id number]  PCP:  Ria Bush, MD  Cardiologist:  Sherren Mocha, MD    Chief Complaint  Patient presents with  . Essential Hypertension    1 year follow up     History of Present Illness: Christian Kim is a 71 y.o. male who presents for  follow-up of coronary artery disease. He initially presented in 2008 with a non-ST elevation infarct. At that time he underwent complex PCI of the left circumflex and a long area of total occlusion was treated with overlapping drug-eluting stents. He then underwent staged PCI of the right coronary artery during that same hospital admission. He was noted to have moderate LAD stenosis and this is been managed medically.  The patient is here alone today. He's had a few episodes of right-sided chest discomfort. He does not clearly relate this to physical exertion. He is very active on his farm. Describes the episodes as a pressure-like sensation in the right side of the chest. They have been self-limited. No radiation to the arm or neck. His previous anginal equivalent was right arm and shoulder pain. He denies shortness of breath, edema, or heart palpitations. He is compliant with his medications.   Past Medical History:  Diagnosis Date  . CAD (coronary artery disease) 2008   stent  . CAP (community acquired pneumonia) 03/07/2015  . Choledocholithiasis    recurrent/multiple ERCPs with stone extraction and sphincterotomy   . CHOLEDOCHOLITHIASIS 09/20/2007   Qualifier: Diagnosis of  By: Roxan Hockey, [place]    . GERD (gastroesophageal reflux disease)   . Gout 2013   dx by crystal analysis  . Hyperlipidemia   . Hypertension   . Myocardial infarction [facility]) 12-2006    Past Surgical History:  Procedure Laterality Date  . CHOLECYSTECTOMY  2006   w/ revision 2/2 persistent gallstones  . COLONOSCOPY  06/2013   1 polyp, rpt 5 yrs [name])  . CORONARY  ANGIOPLASTY WITH STENT PLACEMENT  2008   stent, Dr. Hyman Hopes  . NASAL SEPTUM SURGERY      Current Outpatient Prescriptions  Medication Sig Dispense Refill  . amLODipine (NORVASC) 10 MG tablet take 1 tablet by mouth once daily 90 tablet 3  . aspirin 81 MG tablet Take 81 mg by mouth daily.      [name] atorvastatin (LIPITOR) 20 MG tablet take 1 tablet by mouth once daily 90 tablet 3  . colchicine 0.6 MG tablet Take 0.6 mg by mouth daily as needed (gout).    [name] ibuprofen (ADVIL,MOTRIN) 200 MG tablet Take 200 mg by mouth every 6 (six) hours as needed for pain.    [name] lisinopril (PRINIVIL,ZESTRIL) 40 MG tablet Take 1 tablet (40 mg total) by mouth daily. 90 tablet 3  . metoprolol succinate (TOPROL-XL) 50 MG 24 hr tablet take 1 tablet by mouth once daily WITH OR IMMEDIATELY FOLLOWING A MEAL 90 tablet 3  . omeprazole (PRILOSEC) 20 MG capsule Take 1 capsule (20 mg total) by mouth daily. 90 capsule 3  . ursodiol (ACTIGALL) 300 MG capsule take 2 capsules by mouth twice a day 360 capsule 2   No current facility-administered medications for this visit.     Allergies:   Allopurinol   Social History:  The patient  reports that he has never smoked. He has never used smokeless tobacco. He reports that he drinks alcohol. He reports that he does not use drugs.   Family  History:  The patient's  family history includes Liver cancer in his mother; Lung cancer in his mother.    ROS:  Please see the history of present illness. All other systems are reviewed and negative.    PHYSICAL EXAM: VS:  BP (!) 150/78   Pulse (!) 54   Ht 5\' 11"  (1.803 m)   Wt 200 lb 6.4 oz (90.9 kg)   BMI 27.95 kg/m  , BMI Body mass index is 27.95 kg/m. GEN: Well nourished, well developed, in no acute distress  HEENT: normal  Neck: no JVD, no masses. No carotid bruits Cardiac: RRR without murmur or gallop                Respiratory:  clear to auscultation bilaterally, normal work of breathing GI: soft, nontender, nondistended, +  BS MS: no deformity or atrophy  Ext: no pretibial edema, pedal pulses 2+= bilaterally Skin: warm and dry, no rash Neuro:  Strength and sensation are intact Psych: euthymic mood, full affect  EKG:  EKG is ordered today. The ekg ordered today shows sinus brady 52 bpm, otherwise WNL  Recent Labs: 06/19/2016: ALT 20; BUN 18; Creatinine, Ser 1.35; Hemoglobin 15.3; Platelets 194.0; Potassium 4.4; Sodium 139; TSH 2.83   Lipid Panel     Component Value Date/Time   CHOL 131 06/19/2016 1106   TRIG 272.0 (H) 06/19/2016 1106   HDL 35.70 (L) 06/19/2016 1106   CHOLHDL 4 06/19/2016 1106   VLDL 54.4 (H) 06/19/2016 1106   LDLCALC 36 09/16/2008 0813   LDLDIRECT 63.0 06/19/2016 1106      Wt Readings from Last 3 Encounters:  09/24/16 200 lb 6.4 oz (90.9 kg)  07/02/16 199 lb 8 oz (90.5 kg)  06/19/16 199 lb 8 oz (90.5 kg)    ASSESSMENT AND PLAN: 1.  CAD, native vessel, with angina: Patient with somewhat atypical chest discomfort in the right chest. However, even his past symptoms or in the right shoulder and arm. It's been 10 years since his PCI. I reviewed his coronary anatomy and he underwent stenting of the circumflex and right coronary arteries with moderate residual stenosis of the LAD. I recommended an exercise echocardiogram to evaluate for ischemic heart disease.  2. HTN with CKD 3: continues on a combination of metoprolol, amlodipine, and lisinopril. Blood pressure is elevated today. He generally follows a low-sodium diet, but admits to eating [place] food yesterday. He added a lot of soy sauce and thinks he had a pretty big salt load. I asked him to obtain all monitor and follow his blood pressure. He is on maximal doses of 3 antihypertensive medicines and would have to start on an additional medication if his blood pressure is above goal. He was counseled on a low-sodium diet.  3. Hyperlipidemia: treated with atorvastatin. LDL is 63 mg/dL.  Current medicines are reviewed with the patient  today.  The patient does not have concerns regarding medicines.  Labs/ tests ordered today include:   Orders Placed This Encounter  Procedures  . EKG 12-Lead  . ECHOCARDIOGRAM STRESS TEST    Disposition:   FU one year  Signed, Sherren Mocha, MD  09/25/2016 1:37 PM    [facility] [address] [place], [place]  [address] Phone: [phone]; Fax: [phone]

## 2016-10-11 ENCOUNTER — Telehealth (HOSPITAL_COMMUNITY): Payer: Self-pay | Admitting: *Deleted

## 2016-10-11 NOTE — Telephone Encounter (Signed)
Patient given detailed instructions per Stress Test Requisition Sheet for test on 10/17/16 at 2:30.Patient Notified to arrive 30 minutes early, and that it is imperative to arrive on time for appointment to keep from having the test rescheduled.  Patient verbalized understanding. Christian Kim

## 2016-10-17 ENCOUNTER — Ambulatory Visit (HOSPITAL_COMMUNITY): Payer: Medicare Other

## 2016-10-17 ENCOUNTER — Ambulatory Visit (HOSPITAL_COMMUNITY): Payer: Medicare Other | Attending: Cardiovascular Disease

## 2016-10-17 DIAGNOSIS — E785 Hyperlipidemia, unspecified: Secondary | ICD-10-CM

## 2016-10-17 DIAGNOSIS — I10 Essential (primary) hypertension: Secondary | ICD-10-CM | POA: Diagnosis not present

## 2016-10-17 DIAGNOSIS — R079 Chest pain, unspecified: Secondary | ICD-10-CM | POA: Diagnosis not present

## 2017-01-22 ENCOUNTER — Ambulatory Visit (INDEPENDENT_AMBULATORY_CARE_PROVIDER_SITE_OTHER): Payer: Medicare Other | Admitting: Family Medicine

## 2017-01-22 ENCOUNTER — Encounter: Payer: Self-pay | Admitting: Family Medicine

## 2017-01-22 VITALS — BP 138/76 | HR 57 | Temp 98.1°F | Wt 195.0 lb

## 2017-01-22 DIAGNOSIS — N419 Inflammatory disease of prostate, unspecified: Secondary | ICD-10-CM | POA: Diagnosis not present

## 2017-01-22 DIAGNOSIS — R350 Frequency of micturition: Secondary | ICD-10-CM | POA: Diagnosis not present

## 2017-01-22 HISTORY — DX: Inflammatory disease of prostate, unspecified: N41.9

## 2017-01-22 LAB — POC URINALSYSI DIPSTICK (AUTOMATED)
BILIRUBIN UA: NEGATIVE
Glucose, UA: NEGATIVE
KETONES UA: NEGATIVE
Leukocytes, UA: NEGATIVE
Nitrite, UA: NEGATIVE
Protein, UA: POSITIVE
RBC UA: NEGATIVE
SPEC GRAV UA: 1.025 (ref 1.010–1.025)
UROBILINOGEN UA: 0.2 U/dL
pH, UA: 6 (ref 5.0–8.0)

## 2017-01-22 MED ORDER — SULFAMETHOXAZOLE-TRIMETHOPRIM 800-160 MG PO TABS: 1.0000 | ORAL_TABLET | Freq: Two times a day (BID) | ORAL | 0 refills | Status: DC

## 2017-01-22 NOTE — Patient Instructions (Addendum)
Urine looked ok - we will send culture regardless. Possible prostate infection - let's treat with bactrim antibiotic twice daily with food for 14 days.  Push fluids and rest. If recurrent symptoms, call us for appointment on that day.

## 2017-01-22 NOTE — Progress Notes (Signed)
BP 138/76   Pulse (!) 57   Temp 98.1 F (36.7 C) (Oral)   Wt 195 lb (88.5 kg)   SpO2 96%   BMI 27.20 kg/m    CC: back pain Subjective:    Patient ID: Christian Kim, male    DOB: 1945-08-11, 71 y.o.   MRN: [id number]  HPI: Christian Kim is a 71 y.o. male presenting on 01/22/2017 for Back Pain and Urinary Frequency   1 mo ago had episode of lower back pain associated with chills and diaphoresis. Urine was dark yellow and worse smelling than normal. This improved after a few days. Last week had 2 days of similar symptoms of lower back pain, chills, urgency and frequency. Only residual pain is mild soreness lower back.   Denies dysuria, hematuria, nausea/vomiting, diarrhea or constipation, blood in stool. No rectal or scrotal pain. No urinary retention.   He did not check for fever.   Denies inciting trauma/injury or falls.   Relevant past medical, surgical, family and social history reviewed and updated as indicated. Interim medical history since our last visit reviewed. Allergies and medications reviewed and updated. Outpatient Medications Prior to Visit  Medication Sig Dispense Refill  . amLODipine (NORVASC) 10 MG tablet take 1 tablet by mouth once daily 90 tablet 3  . aspirin 81 MG tablet Take 81 mg by mouth daily.      [name] atorvastatin (LIPITOR) 20 MG tablet take 1 tablet by mouth once daily 90 tablet 3  . colchicine 0.6 MG tablet Take 0.6 mg by mouth daily as needed (gout).    [name] ibuprofen (ADVIL,MOTRIN) 200 MG tablet Take 200 mg by mouth every 6 (six) hours as needed for pain.    [name] lisinopril (PRINIVIL,ZESTRIL) 40 MG tablet Take 1 tablet (40 mg total) by mouth daily. 90 tablet 3  . metoprolol succinate (TOPROL-XL) 50 MG 24 hr tablet take 1 tablet by mouth once daily WITH OR IMMEDIATELY FOLLOWING A MEAL 90 tablet 3  . omeprazole (PRILOSEC) 20 MG capsule Take 1 capsule (20 mg total) by mouth daily. 90 capsule 3  . ursodiol (ACTIGALL) 300 MG capsule take 2 capsules by mouth  twice a day 360 capsule 2   No facility-administered medications prior to visit.      Per HPI unless specifically indicated in ROS section below Review of Systems     Objective:    BP 138/76   Pulse (!) 57   Temp 98.1 F (36.7 C) (Oral)   Wt 195 lb (88.5 kg)   SpO2 96%   BMI 27.20 kg/m   Wt Readings from Last 3 Encounters:  01/22/17 195 lb (88.5 kg)  09/24/16 200 lb 6.4 oz (90.9 kg)  07/02/16 199 lb 8 oz (90.5 kg)    Physical Exam  Constitutional: He appears well-developed and well-nourished. No distress.  HENT:  Mouth/Throat: Oropharynx is clear and moist. No oropharyngeal exudate.  Eyes: Pupils are equal, round, and reactive to light. Conjunctivae and EOM are normal.  Cardiovascular: Normal rate, regular rhythm, normal heart sounds and intact distal pulses.   No murmur heard. Pulmonary/Chest: Effort normal and breath sounds normal. No respiratory distress. He has no wheezes. He has no rales.  Abdominal: Soft. Normal appearance and bowel sounds are normal. He exhibits no distension and no mass. There is no hepatosplenomegaly. There is no tenderness. There is no rigidity, no rebound, no guarding, no CVA tenderness and negative Murphy's sign.  Psychiatric: He has a normal mood and affect.  Nursing note and vitals reviewed.  Results for orders placed or performed in visit on 01/22/17  POCT Urinalysis Dipstick (Automated)  Result Value Ref Range   Color, UA yellow    Clarity, UA clear    Glucose, UA neg    Bilirubin, UA neg    Ketones, UA neg    Spec Grav, UA 1.025 1.010 - 1.025   Blood, UA neg    pH, UA 6.0 5.0 - 8.0   Protein, UA positive    Urobilinogen, UA 0.2 0.2 or 1.0 E.U./dL   Nitrite, UA neg    Leukocytes, UA Negative Negative      Assessment & Plan:   Problem List Items Addressed This Visit    Prostatitis - Primary    Intermittent episodes of lower back pain, chills, and urinary symptoms of increased frequency and urgency that have resolved on their  own. Suspicious for subacute prostatitis. Discussed with patient. UA today stable. Send UCx today. Treat with 2 wk bactrim course. I asked him to return for same day eval if he has recurrent episode.       Relevant Orders   Urine Culture    Other Visit Diagnoses    Urinary frequency       Relevant Orders   POCT Urinalysis Dipstick (Automated) (Completed)       Follow up plan: Return if symptoms worsen or fail to improve.  Ria Bush, MD

## 2017-01-22 NOTE — Assessment & Plan Note (Signed)
Intermittent episodes of lower back pain, chills, and urinary symptoms of increased frequency and urgency that have resolved on their own. Suspicious for subacute prostatitis. Discussed with patient. UA today stable. Send UCx today. Treat with 2 wk bactrim course. I asked him to return for same day eval if he has recurrent episode.

## 2017-01-24 LAB — URINE CULTURE: Organism ID, Bacteria: NO GROWTH

## 2017-06-27 ENCOUNTER — Other Ambulatory Visit: Payer: Self-pay | Admitting: Family Medicine

## 2017-06-27 DIAGNOSIS — M1A9XX0 Chronic gout, unspecified, without tophus (tophi): Secondary | ICD-10-CM

## 2017-06-27 DIAGNOSIS — E785 Hyperlipidemia, unspecified: Secondary | ICD-10-CM

## 2017-06-27 DIAGNOSIS — Z1211 Encounter for screening for malignant neoplasm of colon: Secondary | ICD-10-CM

## 2017-06-27 DIAGNOSIS — Z125 Encounter for screening for malignant neoplasm of prostate: Secondary | ICD-10-CM

## 2017-06-27 DIAGNOSIS — N289 Disorder of kidney and ureter, unspecified: Secondary | ICD-10-CM

## 2017-06-28 ENCOUNTER — Ambulatory Visit (INDEPENDENT_AMBULATORY_CARE_PROVIDER_SITE_OTHER): Payer: Medicare Other

## 2017-06-28 VITALS — BP 122/68 | HR 61 | Temp 98.7°F | Ht 69.5 in | Wt 195.5 lb

## 2017-06-28 DIAGNOSIS — Z125 Encounter for screening for malignant neoplasm of prostate: Secondary | ICD-10-CM | POA: Diagnosis not present

## 2017-06-28 DIAGNOSIS — E785 Hyperlipidemia, unspecified: Secondary | ICD-10-CM | POA: Diagnosis not present

## 2017-06-28 DIAGNOSIS — Z0000 Encounter for general adult medical examination without abnormal findings: Secondary | ICD-10-CM | POA: Diagnosis not present

## 2017-06-28 DIAGNOSIS — M1A9XX0 Chronic gout, unspecified, without tophus (tophi): Secondary | ICD-10-CM | POA: Diagnosis not present

## 2017-06-28 DIAGNOSIS — N289 Disorder of kidney and ureter, unspecified: Secondary | ICD-10-CM

## 2017-06-28 LAB — PSA: PSA: 1.69 ng/mL (ref 0.10–4.00)

## 2017-06-28 LAB — COMPREHENSIVE METABOLIC PANEL
ALK PHOS: 111 U/L (ref 39–117)
ALT: 16 U/L (ref 0–53)
AST: 14 U/L (ref 0–37)
Albumin: 4.5 g/dL (ref 3.5–5.2)
BILIRUBIN TOTAL: 1.4 mg/dL — AB (ref 0.2–1.2)
BUN: 24 mg/dL — AB (ref 6–23)
CO2: 30 meq/L (ref 19–32)
Calcium: 9.4 mg/dL (ref 8.4–10.5)
Chloride: 105 mEq/L (ref 96–112)
Creatinine, Ser: 1.51 mg/dL — ABNORMAL HIGH (ref 0.40–1.50)
GFR: 48.51 mL/min — AB (ref >60.00)
GLUCOSE: 86 mg/dL (ref 70–99)
POTASSIUM: 4.4 meq/L (ref 3.5–5.1)
SODIUM: 144 meq/L (ref 135–145)
TOTAL PROTEIN: 7.2 g/dL (ref 6.0–8.3)

## 2017-06-28 LAB — LIPID PANEL
CHOL/HDL RATIO: 4
Cholesterol: 105 mg/dL (ref 0–200)
HDL: 28.1 mg/dL — AB (ref >39.00)
LDL Cholesterol: 42 mg/dL (ref 0–99)
NONHDL: 77.1
Triglycerides: 176 mg/dL — ABNORMAL HIGH (ref 0.0–149.0)
VLDL: 35.2 mg/dL (ref 0.0–40.0)

## 2017-06-28 LAB — URIC ACID: Uric Acid, Serum: 7.6 mg/dL (ref 4.0–7.8)

## 2017-06-28 LAB — VITAMIN D 25 HYDROXY (VIT D DEFICIENCY, FRACTURES): VITD: 29.78 ng/mL — AB (ref 30.00–100.00)

## 2017-06-28 NOTE — Progress Notes (Signed)
Pre visit review using our clinic review tool, if applicable. No additional management support is needed unless otherwise documented below in the visit note. 

## 2017-06-28 NOTE — Patient Instructions (Signed)
Christian Kim , Thank you for taking time to come for your Medicare Wellness Visit. I appreciate your ongoing commitment to your health goals. Please review the following plan we discussed and let me know if I can assist you in the future.   These are the goals we discussed: Goals    . 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        This is a list of the screening recommended for you and due dates:  Health Maintenance  Topic Date Due  . Colon Cancer Screening  07/16/2018  . DTaP/Tdap/Td vaccine (2 - Td) 06/13/2025  . Tetanus Vaccine  06/13/2025  . Flu Shot  Completed  .  Hepatitis C: One time screening is recommended by Center for Disease Control  (CDC) for  adults born from [birth year] through 1965.   Completed  . Pneumonia vaccines  Completed   Preventive Care for Adults  A healthy lifestyle and preventive care can promote health and wellness. Preventive health guidelines for adults include the following key practices.  . A routine yearly physical is a good way to check with your health care provider about your health and preventive screening. It is a chance to share any concerns and updates on your health and to receive a thorough exam.  . Visit your dentist for a routine exam and preventive care every 6 months. Brush your teeth twice a day and floss once a day. Good oral hygiene prevents tooth decay and gum disease.  . The frequency of eye exams is based on your age, health, family medical history, use  of contact lenses, and other factors. Follow your health care provider's recommendations for frequency of eye exams.  . Eat a healthy diet. Foods like vegetables, fruits, whole grains, low-fat dairy products, and lean protein foods contain the nutrients you need without too many calories. Decrease your intake of foods high in solid fats, added sugars, and salt.  Eat the right amount of calories for you. Get information about a proper diet from your health care provider, if necessary.  . Regular physical exercise is one of the most important things you can do for your health. Most adults should get at least 150 minutes of moderate-intensity exercise (any activity that increases your heart rate and causes you to sweat) each week. In addition, most adults need muscle-strengthening exercises on 2 or more days a week.  Silver Sneakers may be a benefit available to you. To determine eligibility, you may visit the website: www.silversneakers.com or contact program at 772-076-7958 Mon-Fri between 8AM-8PM.   . Maintain a healthy weight. The body mass index (BMI) is a screening tool to identify possible weight problems. It provides an estimate of body fat based on height and weight. Your health care provider can find your BMI and can help you achieve or maintain a healthy weight.   For adults 20 years and older: ? A BMI below 18.5 is considered underweight. ? A BMI of 18.5 to 24.9 is normal. ? A BMI of 25 to 29.9 is considered overweight. ? A BMI of 30 and above is considered obese.   . Maintain normal blood lipids and cholesterol levels by exercising and minimizing your intake of saturated fat. Eat a balanced diet with plenty of fruit and vegetables. Blood tests for lipids and cholesterol should begin at age  20 and be repeated every 5 years. If your lipid or cholesterol levels are high, you are over 50, or you are at high risk for heart disease, you may need your cholesterol levels checked more frequently. Ongoing high lipid and cholesterol levels should be treated with medicines if diet and exercise are not working.  . If you smoke, find out from your health care provider how to quit. If you do not use tobacco, please do not start.  . If you choose to drink alcohol, please do not consume more than 2 drinks per day. One drink is considered to be 12 ounces (355  mL) of beer, 5 ounces (148 mL) of wine, or 1.5 ounces (44 mL) of liquor.  . If you are 51-39 years old, ask your health care provider if you should take aspirin to prevent strokes.  . Use sunscreen. Apply sunscreen liberally and repeatedly throughout the day. You should seek shade when your shadow is shorter than you. Protect yourself by wearing long sleeves, pants, a wide-brimmed hat, and sunglasses year round, whenever you are outdoors.  . Once a month, do a whole body skin exam, using a mirror to look at the skin on your back. Tell your health care provider of new moles, moles that have irregular borders, moles that are larger than a pencil eraser, or moles that have changed in shape or color.

## 2017-06-28 NOTE — Progress Notes (Signed)
Subjective:   Christian Kim is a 72 y.o. male who presents for Medicare Annual/Subsequent preventive examination.  Review of Systems:  N/A Cardiac Risk Factors include: advanced age (>51men, >31 women);male gender;dyslipidemia;hypertension     Objective:    Vitals: BP 122/68 (BP Location: Right Arm, Patient Position: Sitting, Cuff Size: Normal)   Pulse 61   Temp 98.7 F (37.1 C) (Oral)   Ht 5' 9.5" (1.765 m) Comment: no shoes  Wt 195 lb 8 oz (88.7 kg)   SpO2 94%   BMI 28.46 kg/m   Body mass index is 28.46 kg/m.  Advanced Directives 06/28/2017 06/19/2016  Does Patient Have a Medical Advance Directive? Yes No  Type of Paramedic of [place];Living will -  Copy of [place] in Chart? Yes -    Tobacco Social History   Tobacco Use  Smoking Status Never Smoker  Smokeless Tobacco Never Used     Counseling given: No   Clinical Intake:  Pre-visit preparation completed: Yes  Pain : No/denies pain Pain Score: 0-No pain     Nutritional Status: BMI 25 -29 Overweight Nutritional Risks: None Diabetes: No  How often do you need to have someone help you when you read instructions, pamphlets, or other written materials from your doctor or pharmacy?: 1 - Never What is the last grade level you completed in school?: Associate degree  Interpreter Needed?: No  Comments: pt lives with spouse Information entered by :: LPinson, LPN  Past Medical History:  Diagnosis Date  . CAD (coronary artery disease) 2008   stent  . CAP (community acquired pneumonia) 03/07/2015  . Choledocholithiasis    recurrent/multiple ERCPs with stone extraction and sphincterotomy   . CHOLEDOCHOLITHIASIS 09/20/2007   Qualifier: Diagnosis of  By: Roxan Hockey, [place]    . GERD (gastroesophageal reflux disease)   . Gout 2013   dx by crystal analysis  . Hyperlipidemia   . Hypertension   . Myocardial infarction [facility]) 12-2006   Past Surgical History:    Procedure Laterality Date  . CHOLECYSTECTOMY  2006   w/ revision 2/2 persistent gallstones  . COLONOSCOPY  06/2013   1 polyp, rpt 5 yrs [name])  . CORONARY ANGIOPLASTY WITH STENT PLACEMENT  2008   stent, Dr. Hyman Hopes  . NASAL SEPTUM SURGERY     Family History  Problem Relation Age of Onset  . Liver cancer Mother        unknown primary origin  . Lung cancer Mother   . Colon cancer Neg Hx   . Esophageal cancer Neg Hx   . Stomach cancer Neg Hx   . Rectal cancer Neg Hx    Social History   Socioeconomic History  . Marital status: Married    Spouse name: None  . Number of children: 2  . Years of education: None  . Highest education level: None  Social Needs  . Financial resource strain: None  . Food insecurity - worry: None  . Food insecurity - inability: None  . Transportation needs - medical: None  . Transportation needs - non-medical: None  Occupational History  . Occupation: retired Academic librarian  . 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 Smoking status: Never Smoker  . Smokeless tobacco: Never Used  Substance and Sexual Activity  . Alcohol use: Yes    Alcohol/week: 0.0 oz    Comment: rarely drinks - 1 to  2 beers per month  . Drug use: No  . Sexual activity: Yes  Other Topics Concern  . 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   Activity: works farm to stay active, rabbit hunting   Diet: good water, fruits/vegetables daily    Outpatient Encounter Medications as of 06/28/2017  Medication Sig  . amLODipine (NORVASC) 10 MG tablet take 1 tablet by mouth once daily  . aspirin 81 MG tablet Take 81 mg by mouth daily.    [name] atorvastatin (LIPITOR) 20 MG tablet take 1 tablet by mouth once daily  . colchicine 0.6 MG tablet Take 0.6 mg by mouth daily as needed (gout).  [name]  ibuprofen (ADVIL,MOTRIN) 200 MG tablet Take 200 mg by mouth every 6 (six) hours as needed for pain.  [name] lisinopril (PRINIVIL,ZESTRIL) 40 MG tablet Take 1 tablet (40 mg total) by mouth daily.  . metoprolol succinate (TOPROL-XL) 50 MG 24 hr tablet take 1 tablet by mouth once daily WITH OR IMMEDIATELY FOLLOWING A MEAL  . omeprazole (PRILOSEC) 20 MG capsule Take 1 capsule (20 mg total) by mouth daily.  . ursodiol (ACTIGALL) 300 MG capsule take 2 capsules by mouth twice a day  . [DISCONTINUED] sulfamethoxazole-trimethoprim (BACTRIM DS,SEPTRA DS) 800-160 MG tablet Take 1 tablet by mouth 2 (two) times daily.   No facility-administered encounter medications on file as of 06/28/2017.     Activities of Daily Living In your present state of health, do you have any difficulty performing the following activities: 06/28/2017  Hearing? N  Comment ringing in both ears  Vision? N  Difficulty concentrating or making decisions? N  Walking or climbing stairs? N  Dressing or bathing? N  Doing errands, shopping? N  Preparing Food and eating ? N  Using the Toilet? N  In the past six months, have you accidently leaked urine? N  Do you have problems with loss of bowel control? N  Managing your Medications? N  Managing your Finances? N  Housekeeping or managing your Housekeeping? N  Some recent data might be hidden    Patient Care Team: Ria Bush, MD as PCP - General (Family Medicine)   Assessment:   This is a routine wellness examination for Christian Kim.   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        Fall Risk Fall Risk  06/28/2017 07/02/2016 06/19/2016 06/14/2015 06/07/2014  Falls in the past year? 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 No - - - -   Depression Screen PHQ 2/9 Scores 06/28/2017 06/19/2016 06/14/2015 06/07/2014  PHQ - 2 Score 0 0 0 0  PHQ- 9 Score 0 - - -    Cognitive Function MMSE - Mini Mental State Exam 06/28/2017 06/19/2016  Orientation to time 5 5  Orientation to Place 5 5  Registration 3 3  Attention/ Calculation 0 0  Recall 3 3  Language- name 2 objects 0 0  Language- repeat 1 1  Language- follow 3 step command 3 3  Language- read & follow direction 0 0  Write a sentence 0 0  Copy design 0 0  Total score 20 20     PLEASE NOTE: A Mini-Cog screen was completed. Maximum score is 20. A value of 0 denotes this part of Folstein MMSE was not completed or the patient failed this part of the Mini-Cog screening.   Mini-Cog Screening Orientation to Time - Max 5 pts Orientation to Place - Max 5 pts Registration - Max 3 pts Recall - Max 3 pts Language Repeat - Max 1 pts Language Follow 3 Step Command - Max 3 pts     Immunization History  Administered Date(s) Administered  . Influenza Whole 03/09/2009, 02/26/2011, 02/26/2012  . Influenza, High Dose Seasonal PF 02/03/2014, 04/15/2015  . Influenza,inj,Quad PF,6+ Mos 03/04/2013  . Influenza-Unspecified 02/26/2016, 02/25/2017  . Pneumococcal Conjugate-13 06/07/2014  . Pneumococcal Polysaccharide-23 01/09/2011  . Td 01/29/2002  . Tdap 06/14/2015  . Zoster 01/09/2011    Screening Tests Health Maintenance  Topic Date Due  . COLONOSCOPY  07/16/2018  . DTaP/Tdap/Td (2 - Td) 06/13/2025  . TETANUS/TDAP   06/13/2025  . INFLUENZA VACCINE  Completed  . Hepatitis C Screening  Completed  . PNA vac Low Risk Adult  Completed      Plan:     I have personally reviewed, addressed, and noted the following in the patient's chart:  A. Medical and social history B. Use of alcohol, tobacco or illicit drugs  C. Current medications and supplements D. Functional ability and status E.  Nutritional status F.  Physical activity G. Advance directives H. List of other physicians I.  Hospitalizations, surgeries, and ER visits in previous 12 months J.  [place] to include hearing, vision, cognitive, depression L. Referrals and appointments - none  In addition, I have reviewed and discussed with patient certain preventive protocols, quality metrics, and best practice recommendations. A written personalized care plan for preventive services as well as general preventive health recommendations were provided to patient.  See attached scanned questionnaire for additional information.   Signed,   Lindell Noe, MHA, BS, LPN Health Coach

## 2017-06-28 NOTE — Progress Notes (Signed)
PCP notes:   Health maintenance:  No gaps identified.  Abnormal screenings:   Fall risk - hx of accidental fall Fall Risk  06/28/2017 07/02/2016 06/19/2016 06/14/2015 06/07/2014  Falls in the past year? 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 No - - - -   Hearing - failed  Hearing Screening   125Hz  250Hz  500Hz  1000Hz  2000Hz  3000Hz  4000Hz  6000Hz  8000Hz   Right ear:   40 40 40  0    Left ear:   40 40 40  0     Patient concerns:   None  Nurse concerns:  None  Next PCP appt:   07/09/17 @ 1130

## 2017-06-30 ENCOUNTER — Other Ambulatory Visit: Payer: Self-pay | Admitting: Family Medicine

## 2017-06-30 DIAGNOSIS — M1A9XX0 Chronic gout, unspecified, without tophus (tophi): Secondary | ICD-10-CM

## 2017-06-30 DIAGNOSIS — N289 Disorder of kidney and ureter, unspecified: Secondary | ICD-10-CM

## 2017-06-30 DIAGNOSIS — E785 Hyperlipidemia, unspecified: Secondary | ICD-10-CM

## 2017-06-30 NOTE — Progress Notes (Signed)
I reviewed health advisor's note, was available for consultation, and agree with documentation and plan.  

## 2017-07-03 ENCOUNTER — Other Ambulatory Visit: Payer: Self-pay | Admitting: Internal Medicine

## 2017-07-03 ENCOUNTER — Ambulatory Visit: Payer: Medicare Other

## 2017-07-08 ENCOUNTER — Telehealth: Payer: Self-pay | Admitting: Internal Medicine

## 2017-07-08 MED ORDER — URSODIOL 300 MG PO CAPS: 600.0000 mg | ORAL_CAPSULE | Freq: Two times a day (BID) | ORAL | 2 refills | Status: DC

## 2017-07-08 NOTE — Telephone Encounter (Signed)
Patient states he needs a refill of medication ursodiol sent to [facility] in [place] until appt with Dr.Perry on 3.28.19

## 2017-07-09 ENCOUNTER — Encounter: Payer: Self-pay | Admitting: Family Medicine

## 2017-07-09 ENCOUNTER — Ambulatory Visit (INDEPENDENT_AMBULATORY_CARE_PROVIDER_SITE_OTHER)
Admission: RE | Admit: 2017-07-09 | Discharge: 2017-07-09 | Disposition: A | Discharge status: Home / Self Care | Payer: Medicare Other | Source: Ambulatory Visit | Attending: Family Medicine | Admitting: Family Medicine

## 2017-07-09 ENCOUNTER — Ambulatory Visit (INDEPENDENT_AMBULATORY_CARE_PROVIDER_SITE_OTHER): Payer: Medicare Other | Admitting: Family Medicine

## 2017-07-09 VITALS — BP 140/78 | HR 59 | Temp 98.3°F | Ht 70.0 in | Wt 198.0 lb

## 2017-07-09 DIAGNOSIS — Z7189 Other specified counseling: Secondary | ICD-10-CM

## 2017-07-09 DIAGNOSIS — E785 Hyperlipidemia, unspecified: Secondary | ICD-10-CM | POA: Diagnosis not present

## 2017-07-09 DIAGNOSIS — N183 Chronic kidney disease, stage 3 unspecified: Secondary | ICD-10-CM

## 2017-07-09 DIAGNOSIS — R0989 Other specified symptoms and signs involving the circulatory and respiratory systems: Secondary | ICD-10-CM | POA: Diagnosis not present

## 2017-07-09 DIAGNOSIS — I10 Essential (primary) hypertension: Secondary | ICD-10-CM | POA: Diagnosis not present

## 2017-07-09 DIAGNOSIS — Z801 Family history of malignant neoplasm of trachea, bronchus and lung: Secondary | ICD-10-CM | POA: Diagnosis not present

## 2017-07-09 DIAGNOSIS — B351 Tinea unguium: Secondary | ICD-10-CM

## 2017-07-09 DIAGNOSIS — I2510 Atherosclerotic heart disease of native coronary artery without angina pectoris: Secondary | ICD-10-CM | POA: Diagnosis not present

## 2017-07-09 DIAGNOSIS — Z0001 Encounter for general adult medical examination with abnormal findings: Secondary | ICD-10-CM

## 2017-07-09 DIAGNOSIS — M1A9XX0 Chronic gout, unspecified, without tophus (tophi): Secondary | ICD-10-CM | POA: Diagnosis not present

## 2017-07-09 NOTE — Assessment & Plan Note (Signed)
Urate elevated. No recent significant flares.  Reviewed skipping lipitor when he takes PRN colchicine.

## 2017-07-09 NOTE — Assessment & Plan Note (Signed)
Chronic, mildly elevated today - improved on repeat. Pt will monitor at home. Continue current regimen.

## 2017-07-09 NOTE — Assessment & Plan Note (Addendum)
Advanced directives:  Would want wife to be HCPOA. Worked with lawyer to set this up. [place] with life support if possibility of reversal but doesn't want prolonged life support if terminal condition.

## 2017-07-09 NOTE — Patient Instructions (Addendum)
When you need to take colchicine, try to skip a day or two of lipitor as these medicines can interact together.  If interested, check with pharmacy about new 2 shot shingles series (shingrix).  Bring me copy of your advanced directives to update your chart.  Chest xray today.  You are doing well today. Return as needed or in 1 year for next physical and medicare wellness visit.   Health Maintenance, Male A healthy lifestyle and preventive care is important for your health and wellness. Ask your health care provider about what schedule of regular examinations is right for you. What should I know about weight and diet? Eat a Healthy Diet  Eat plenty of vegetables, fruits, whole grains, low-fat dairy products, and lean protein.  Do not eat a lot of foods high in solid fats, added sugars, or salt.  Maintain a Healthy Weight Regular exercise can help you achieve or maintain a healthy weight. You should:  Do at least 150 minutes of exercise each week. The exercise should increase your heart rate and make you sweat (moderate-intensity exercise).  Do strength-training exercises at least twice a week.  Watch Your Levels of Cholesterol and Blood Lipids  Have your blood tested for lipids and cholesterol every 5 years starting at 72 years of age. If you are at high risk for heart disease, you should start having your blood tested when you are 72 years old. You may need to have your cholesterol levels checked more often if: ? Your lipid or cholesterol levels are high. ? You are older than 72 years of age. ? You are at high risk for heart disease.  What should I know about cancer screening? Many types of cancers can be detected early and may often be prevented. Lung Cancer  You should be screened every year for lung cancer if: ? You are a current smoker who has smoked for at least 30 years. ? You are a former smoker who has quit within the past 15 years.  Talk to your health care provider  about your screening options, when you should start screening, and how often you should be screened.  Colorectal Cancer  Routine colorectal cancer screening usually begins at 72 years of age and should be repeated every 5-10 years until you are 72 years old. You may need to be screened more often if early forms of precancerous polyps or small growths are found. Your health care provider may recommend screening at an earlier age if you have risk factors for colon cancer.  Your health care provider may recommend using home test kits to check for hidden blood in the stool.  A small camera at the end of a tube can be used to examine your colon (sigmoidoscopy or colonoscopy). This checks for the earliest forms of colorectal cancer.  Prostate and Testicular Cancer  Depending on your age and overall health, your health care provider may do certain tests to screen for prostate and testicular cancer.  Talk to your health care provider about any symptoms or concerns you have about testicular or prostate cancer.  Skin Cancer  Check your skin from head to toe regularly.  Tell your health care provider about any new moles or changes in moles, especially if: ? There is a change in a mole's size, shape, or color. ? You have a mole that is larger than a pencil eraser.  Always use sunscreen. Apply sunscreen liberally and repeat throughout the day.  Protect yourself by wearing long sleeves,  pants, a wide-brimmed hat, and sunglasses when outside.  What should I know about heart disease, diabetes, and high blood pressure?  If you are 54-45 years of age, have your blood pressure checked every 3-5 years. If you are 50 years of age or older, have your blood pressure checked every year. You should have your blood pressure measured twice-once when you are at a hospital or clinic, and once when you are not at a hospital or clinic. Record the average of the two measurements. To check your blood pressure when you  are not at a hospital or clinic, you can use: ? An automated blood pressure machine at a pharmacy. ? A home blood pressure monitor.  Talk to your health care provider about your target blood pressure.  If you are between 24-35 years old, ask your health care provider if you should take aspirin to prevent heart disease.  Have regular diabetes screenings by checking your fasting blood sugar level. ? If you are at a normal weight and have a low risk for diabetes, have this test once every three years after the age of 30. ? If you are overweight and have a high risk for diabetes, consider being tested at a younger age or more often.  A one-time screening for abdominal aortic aneurysm (AAA) by ultrasound is recommended for men aged 89-75 years who are current or former smokers. What should I know about preventing infection? Hepatitis B If you have a higher risk for hepatitis B, you should be screened for this virus. Talk with your health care provider to find out if you are at risk for hepatitis B infection. Hepatitis C Blood testing is recommended for:  Everyone born from [birth year] through 1965.  Anyone with known risk factors for hepatitis C.  Sexually Transmitted Diseases (STDs)  You should be screened each year for STDs including gonorrhea and chlamydia if: ? You are sexually active and are younger than 72 years of age. ? You are older than 72 years of age and your health care provider tells you that you are at risk for this type of infection. ? Your sexual activity has changed since you were last screened and you are at an increased risk for chlamydia or gonorrhea. Ask your health care provider if you are at risk.  Talk with your health care provider about whether you are at high risk of being infected with HIV. Your health care provider may recommend a prescription medicine to help prevent HIV infection.  What else can I do?  Schedule regular health, dental, and eye exams.  Stay  current with your vaccines (immunizations).  Do not use any tobacco products, such as cigarettes, chewing tobacco, and e-cigarettes. If you need help quitting, ask your health care provider.  Limit alcohol intake to no more than 2 drinks per day. One drink equals 12 ounces of beer, 5 ounces of wine, or 1 ounces of hard liquor.  Do not use street drugs.  Do not share needles.  Ask your health care provider for help if you need support or information about quitting drugs.  Tell your health care provider if you often feel depressed.  Tell your health care provider if you have ever been abused or do not feel safe at home. This information is not intended to replace advice given to you by your health care provider. Make sure you discuss any questions you have with your health care provider. Document Released: 11/10/2007 Document Revised: 01/11/2016 Document Reviewed: 02/15/2015 Elsevier  Interactive Patient Education  [employer].

## 2017-07-09 NOTE — Assessment & Plan Note (Signed)
Stable, asxs. Continue aspirin, statin. Followed by cards.

## 2017-07-09 NOTE — Progress Notes (Signed)
BP 140/78 (BP Location: Right Arm, Cuff Size: Large)   Pulse (!) 59   Temp 98.3 F (36.8 C) (Oral)   Ht 5\' 10"  (1.778 m)   Wt 198 lb (89.8 kg)   SpO2 97%   BMI 28.41 kg/m    CC: CPE Subjective:    Patient ID: Christian Kim, male    DOB: 05/09/1946, 72 y.o.   MRN: [id number]  HPI: Christian Kim is a 72 y.o. male presenting on 07/09/2017 for Annual Exam (Pt 2.)   Saw Katha Cabal last week for medicare wellness visit. Note reviewed.   Treated for subacute prostatitis 12/2016 with 2 wk bactrim course with resolution of symptoms. Gout - small flare 1 month ago - treated with colchicine dose x1 with improvement.   Chronic ursodiol use 2/2 recurrent choledocholithiasis after cholecystectomy. Worse with fatty greasy foods. Prilosec has also significantly helped. Planned f/u with GI with Dr [name] next month.   Preventative: COLONOSCOPY Date: 06/2013 1 polyp, rpt 5 yrs [name])  Prostate cancer screening - has had routinely checked but not in last few years. Denies nocturia or LUTS sxs. Deferred today.  Flu shot yearly.  Pneumovax 2012, prevnar 05/2014 Tdap 05/2015 Zostavax 2012.  shingrix - discussed  Advanced directives:  Would want wife to be HCPOA. Worked with lawyer to set this up. [place] with life support if possibility of reversal but doesn't want prolonged life support if terminal condition.   Seat belt use discussed Sunscreen use discussed. No changing moles on skin.  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 Interim medical history since our last visit reviewed. Allergies and medications reviewed and updated. Outpatient Medications Prior to Visit  Medication Sig Dispense  Refill  . amLODipine (NORVASC) 10 MG tablet take 1 tablet by mouth once daily 90 tablet 3  . aspirin 81 MG tablet Take 81 mg by mouth daily.      [name] atorvastatin (LIPITOR) 20 MG tablet take 1 tablet by mouth once daily 90 tablet 3  . colchicine 0.6 MG tablet Take 0.6 mg by mouth daily as needed (gout).    [name] ibuprofen (ADVIL,MOTRIN) 200 MG tablet Take 200 mg by mouth every 6 (six) hours as needed for pain.    [name] lisinopril (PRINIVIL,ZESTRIL) 40 MG tablet Take 1 tablet (40 mg total) by mouth daily. 90 tablet 3  . metoprolol succinate (TOPROL-XL) 50 MG 24 hr tablet take 1 tablet by mouth once daily WITH OR IMMEDIATELY FOLLOWING A MEAL 90 tablet 3  . omeprazole (PRILOSEC) 20 MG capsule Take 1 capsule (20 mg total) by mouth daily. 90 capsule 3  . ursodiol (ACTIGALL) 300 MG capsule Take 2 capsules (600 mg total) by mouth 2 (two) times daily. 360 capsule 2   No facility-administered medications prior to visit.      Per HPI unless specifically indicated in ROS section below Review of Systems  Constitutional: Negative for activity change, appetite change, chills, fatigue, fever and unexpected weight change.  HENT: Negative for hearing loss.   Eyes: Negative for visual disturbance.  Respiratory: Negative for cough, chest tightness, shortness of breath and wheezing.   Cardiovascular: Negative for chest pain, palpitations and leg swelling.  Gastrointestinal: Negative for abdominal  distention, abdominal pain, blood in stool, constipation, diarrhea, nausea and vomiting.  Genitourinary: Negative for difficulty urinating and hematuria.  Musculoskeletal: Negative for arthralgias, myalgias and neck pain.  Skin: Negative for rash.  Neurological: Negative for dizziness, seizures, syncope and headaches.  Hematological: Negative for adenopathy. Does not bruise/bleed easily.  Psychiatric/Behavioral: Negative for dysphoric mood. The patient is not nervous/anxious.        Objective:    BP 140/78 (BP Location:  Right Arm, Cuff Size: Large)   Pulse (!) 59   Temp 98.3 F (36.8 C) (Oral)   Ht 5\' 10"  (1.778 m)   Wt 198 lb (89.8 kg)   SpO2 97%   BMI 28.41 kg/m   Wt Readings from Last 3 Encounters:  07/09/17 198 lb (89.8 kg)  06/28/17 195 lb 8 oz (88.7 kg)  01/22/17 195 lb (88.5 kg)    Physical Exam  Constitutional: He is oriented to person, place, and time. He appears well-developed and well-nourished. No distress.  HENT:  Head: Normocephalic and atraumatic.  Right Ear: Hearing, tympanic membrane, external ear and ear canal normal.  Left Ear: Hearing, tympanic membrane, external ear and ear canal normal.  Nose: Nose normal.  Mouth/Throat: Uvula is midline, oropharynx is clear and moist and mucous membranes are normal. No oropharyngeal exudate, posterior oropharyngeal edema or posterior oropharyngeal erythema.  Eyes: Conjunctivae and EOM are normal. Pupils are equal, round, and reactive to light. No scleral icterus.  Neck: Normal range of motion. Neck supple. Carotid bruit is not present. No thyromegaly present.  Cardiovascular: Normal rate, regular rhythm, normal heart sounds and intact distal pulses.  No murmur heard. Pulses:      Radial pulses are 2+ on the right side, and 2+ on the left side.  Pulmonary/Chest: Effort normal. No respiratory distress. He has decreased breath sounds in the right lower field. He has no wheezes. He has no rhonchi. He has no rales.  Persistent crackles RLL  Abdominal: Soft. Bowel sounds are normal. He exhibits no distension and no mass. There is no tenderness. There is no rebound and no guarding.  Musculoskeletal: Normal range of motion. He exhibits no edema.  Lymphadenopathy:    He has no cervical adenopathy.  Neurological: He is alert and oriented to person, place, and time.  CN grossly intact, station and gait intact  Skin: Skin is warm and dry. No rash noted.  Ongoing L great toe onychomycosis  Psychiatric: He has a normal mood and affect. His behavior is  normal. Judgment and thought content normal.  Nursing note and vitals reviewed.  Results for orders placed or performed in visit on 06/28/17  Uric acid  Result Value Ref Range   Uric Acid, Serum 7.6 4.0 - 7.8 mg/dL  VITAMIN D 25 Hydroxy (Vit-D Deficiency, Fractures)  Result Value Ref Range   VITD 29.78 (L) 30.00 - 100.00 ng/mL  PSA  Result Value Ref Range   PSA 1.69 0.10 - 4.00 ng/mL  Comprehensive metabolic panel  Result Value Ref Range   Sodium 144 135 - 145 mEq/L   Potassium 4.4 3.5 - 5.1 mEq/L   Chloride 105 96 - 112 mEq/L   CO2 30 19 - 32 mEq/L   Glucose, Bld 86 70 - 99 mg/dL   BUN 24 (H) 6 - 23 mg/dL   Creatinine, Ser 1.51 (H) 0.40 - 1.50 mg/dL   Total Bilirubin 1.4 (H) 0.2 - 1.2 mg/dL   Alkaline Phosphatase 111 39 - 117 U/L   AST 14 0 - 37  U/L   ALT 16 0 - 53 U/L   Total Protein 7.2 6.0 - 8.3 g/dL   Albumin 4.5 3.5 - 5.2 g/dL   Calcium 9.4 8.4 - 10.5 mg/dL   GFR 48.51 (L) >60.00 mL/min  Lipid panel  Result Value Ref Range   Cholesterol 105 0 - 200 mg/dL   Triglycerides 176.0 (H) 0.0 - 149.0 mg/dL   HDL 28.10 (L) >39.00 mg/dL   VLDL 35.2 0.0 - 40.0 mg/dL   LDL Cholesterol 42 0 - 99 mg/dL   Total CHOL/HDL Ratio 4    NonHDL 77.10       Assessment & Plan:   Problem List Items Addressed This Visit    Advanced care planning/counseling discussion    Advanced directives:  Would want wife to be HCPOA. Worked with lawyer to set this up. [place] with life support if possibility of reversal but doesn't want prolonged life support if terminal condition.        CKD (chronic kidney disease) stage 3, GFR 30-59 ml/min (HCC)    Chronic, stable. Encouraged increased water intake, avoid NSAIDs.      Coronary atherosclerosis    Stable, asxs. Continue aspirin, statin. Followed by cards.       Encounter for general adult medical examination with abnormal findings - Primary    Preventative protocols reviewed and updated unless pt declined. Discussed healthy diet and lifestyle.         Essential hypertension    Chronic, mildly elevated today - improved on repeat. Pt will monitor at home. Continue current regimen.      Gout    Urate elevated. No recent significant flares.  Reviewed skipping lipitor when he takes PRN colchicine.       HLD (hyperlipidemia)    Chronic improving but HDL remains low. Encouraged increased aerobic exercise. The ASCVD Risk score Mikey Bussing DC Jr., et al., 2013) failed to calculate for the following reasons:   The valid total cholesterol range is 130 to 320 mg/dL       Onychomycosis    Reviewed options - continue funginail.  rec against oral lamisil at this time given other medication regimen.        Other Visit Diagnoses    Abnormal lung sounds       Relevant Orders   DG Chest 2 View   Family history of lung cancer       Relevant Orders   DG Chest 2 View       No orders of the defined types were placed in this encounter.  Orders Placed This Encounter  Procedures  . DG Chest 2 View    Standing Status:   Future    Number of Occurrences:   1    Standing Expiration Date:   09/07/2018    Order Specific Question:   Reason for Exam (SYMPTOM  OR DIAGNOSIS REQUIRED)    Answer:   persistent RLL crackles    Order Specific Question:   Preferred imaging location?    Answer:   [facility]    Order Specific Question:   Radiology Contrast Protocol - do NOT remove file path    Answer:   \\[email or web address]    Follow up plan: Return in about 1 year (around 07/09/2018) for annual exam, prior fasting for blood work, medicare wellness visit.  Ria Bush, MD

## 2017-07-09 NOTE — Assessment & Plan Note (Signed)
Preventative protocols reviewed and updated unless pt declined. Discussed healthy diet and lifestyle.  

## 2017-07-09 NOTE — Assessment & Plan Note (Signed)
Chronic improving but HDL remains low. Encouraged increased aerobic exercise. The ASCVD Risk score Christian Kim., et al., 2013) failed to calculate for the following reasons:   The valid total cholesterol range is 130 to 320 mg/dL

## 2017-07-09 NOTE — Assessment & Plan Note (Signed)
Chronic, stable. Encouraged increased water intake, avoid NSAIDs.

## 2017-07-09 NOTE — Assessment & Plan Note (Addendum)
Reviewed options - continue funginail.  rec against oral lamisil at this time given other medication regimen.

## 2017-07-15 ENCOUNTER — Telehealth: Payer: Self-pay | Admitting: Family Medicine

## 2017-07-15 DIAGNOSIS — K219 Gastro-esophageal reflux disease without esophagitis: Secondary | ICD-10-CM

## 2017-07-15 MED ORDER — OMEPRAZOLE 20 MG PO CPDR: 20.0000 mg | DELAYED_RELEASE_CAPSULE | Freq: Every day | ORAL | 3 refills | Status: DC

## 2017-07-15 NOTE — Telephone Encounter (Signed)
Sent refill to [facility]

## 2017-07-15 NOTE — Telephone Encounter (Signed)
Omeprazole refill Last OV: 07/09/17 (annual exam) Last Refill: 07/02/16; # 90; RF x 3 Pharmacy: [facility] on [address] in [place]  GERD was not addressed at [place] 06/2017 It was address at 06/2016 annual exam

## 2017-07-15 NOTE — Telephone Encounter (Signed)
Copied from [place]. Topic: Quick Communication - Rx Refill/Question >> Jul 15, 2017  9:51 AM Burnis Medin, NT wrote: Medication:omeprazole (PRILOSEC) 20 MG capsule   Has the patient contacted their pharmacy? Yes (Agent: If no, request that the patient contact the pharmacy for the refill.)   Preferred Pharmacy (with phone number or street name): [id number] - [place], [place] - [place] [phone] (Phone) [phone] (Fax)     Agent: Please be advised that RX refills may take up to 3 business days. We ask that you follow-up with your pharmacy.

## 2017-08-22 ENCOUNTER — Encounter (INDEPENDENT_AMBULATORY_CARE_PROVIDER_SITE_OTHER): Payer: Self-pay

## 2017-08-22 ENCOUNTER — Ambulatory Visit: Payer: Medicare Other | Admitting: Internal Medicine

## 2017-08-22 ENCOUNTER — Encounter: Payer: Self-pay | Admitting: Internal Medicine

## 2017-08-22 VITALS — BP 130/68 | HR 70 | Ht 70.0 in | Wt 200.0 lb

## 2017-08-22 DIAGNOSIS — K8050 Calculus of bile duct without cholangitis or cholecystitis without obstruction: Secondary | ICD-10-CM

## 2017-08-22 DIAGNOSIS — K219 Gastro-esophageal reflux disease without esophagitis: Secondary | ICD-10-CM | POA: Diagnosis not present

## 2017-08-22 DIAGNOSIS — Z86010 Personal history of colonic polyps: Secondary | ICD-10-CM | POA: Diagnosis not present

## 2017-08-22 MED ORDER — URSODIOL 300 MG PO CAPS: 600.0000 mg | ORAL_CAPSULE | Freq: Two times a day (BID) | ORAL | 3 refills | Status: DC

## 2017-08-22 NOTE — Patient Instructions (Signed)
We have sent the following medications to your pharmacy for you to pick up at your convenience:  Ursodiol

## 2017-08-22 NOTE — Progress Notes (Signed)
HISTORY OF PRESENT ILLNESS:  Christian Kim is a 72 y.o. male , retired state trooper, Christian Kim, and current Christian Kim, who presents today for follow-up regarding his GI care. He has a history of chronic GERD for which she is maintained on PPI. He continues on PPI without complaints. He also has a history of recurrent choledocholithiasis for which she is status post cholecystectomy and repeat ERCPs with common duct clearance. Given recurrent problems he was placed on ursodiol. Since initiating ursodiol he has had no further recurrence of his common duct stones. Currently taking Actigall 600 mg twice daily. No issues. GI review of systems negative. He does have a history of adenomatous colon polyps with colonoscopy last performed February 2015 (sessile serrated polyp. Follow-up in 5 years recommended. Review of outside laboratories from February 2019 fine normal liver tests (bilirubin 1.4) normal liver tests 1 year previous. In January normal CBC with hemoglobin 15.3. No recent relevant imaging.  REVIEW OF SYSTEMS:  All non-GI ROS negative except for back pain, hearing problems  Past Medical History:  Diagnosis Date  . CAD (coronary artery disease) 2008   stent  . CAP (community acquired pneumonia) 03/07/2015  . Choledocholithiasis    recurrent/multiple ERCPs with stone extraction and sphincterotomy   . CHOLEDOCHOLITHIASIS 09/20/2007   Qualifier: Diagnosis of  By: Christian Kim, Christian Kim    . GERD (gastroesophageal reflux disease)   . Gout 2013   dx by crystal analysis  . Hyperlipidemia   . Hypertension   . Myocardial infarction (Christian Kim  . Prostatitis 01/22/2017    Past Surgical History:  Procedure Laterality Date  . CHOLECYSTECTOMY  2006   w/ revision 2/2 persistent gallstones  . COLONOSCOPY  06/2013   1 polyp, rpt 5 yrs Christian Kim)  . CORONARY ANGIOPLASTY WITH STENT PLACEMENT  2008   stent, Dr. Hyman Kim  . NASAL SEPTUM SURGERY      Social History Christian Kim   reports that he has never smoked. He has never used smokeless tobacco. He reports that he drinks alcohol. He reports that he does not use drugs.  family history includes Liver cancer in his mother; Lung cancer in his mother.  Allergies  Allergen Reactions  . Allopurinol Rash       PHYSICAL EXAMINATION: Vital signs: BP 130/68   Pulse 70   Ht 5\' 10"  (1.778 m)   Wt 200 lb (90.7 kg)   BMI 28.70 kg/m   Constitutional: generally well-appearing, no acute distress Psychiatric: alert and oriented x3, cooperative Eyes: extraocular movements intact, anicteric, conjunctiva pink Mouth: oral pharynx moist, no lesions Neck: supple no lymphadenopathy Cardiovascular: heart regular rate and rhythm, no murmur Lungs: clear to auscultation bilaterally Abdomen: soft, nontender, nondistended, no obvious ascites, no peritoneal signs, normal bowel sounds, no organomegaly Rectal:omitted Extremities: no lower extremity edema bilaterally Skin: no lesions on visible extremities Neuro: No focal deficits. Cranial nerves intact  ASSESSMENT:  #1. History of recurrent choledocholithiasis status post cholecystectomy with repeat ERCPs with common bile duct clearance. Now on chronic ursodiol and doing well #2. Chronic GERD. Requires PPI for symptom control. On omeprazole 20 mg daily #3. History of sessile serrated polyp on colonoscopy February 2015   PLAN:  #1. Reflux precautions #2. Continue omeprazole 20 mg daily #3. Continue ursodiol 600 mg twice daily. Prescribed #4. Continue with liver tests annually #6. Surveillance colonoscopy around February 2020. He is aware. Interval follow-up as needed #7. Routine GI office follow-up at least every 2 years. [place] if needed

## 2017-08-30 ENCOUNTER — Telehealth: Payer: Self-pay | Admitting: Family Medicine

## 2017-08-30 MED ORDER — LISINOPRIL 40 MG PO TABS: 40.0000 mg | ORAL_TABLET | Freq: Every day | ORAL | 3 refills | Status: DC

## 2017-08-30 NOTE — Telephone Encounter (Signed)
Copied from [place] [id number]. Topic: Quick Communication - See Telephone Encounter >> Aug 30, 2017 12:33 PM Hewitt Shorts wrote: CRM for notification. See Telephone encounter for: 08/30/17.pt is needing a refill on lisinipril  [facility] [address] [place]

## 2017-09-23 ENCOUNTER — Telehealth: Payer: Self-pay | Admitting: Family Medicine

## 2017-09-23 MED ORDER — METOPROLOL SUCCINATE ER 50 MG PO TB24: ORAL_TABLET | ORAL | 3 refills | Status: DC

## 2017-09-23 MED ORDER — AMLODIPINE BESYLATE 10 MG PO TABS: 10.0000 mg | ORAL_TABLET | Freq: Every day | ORAL | 3 refills | Status: DC

## 2017-09-23 NOTE — Telephone Encounter (Signed)
Copied from [place] [id number]. Topic: Quick Communication - Rx Refill/Question >> Sep 23, 2017 11:57 AM Celedonio Savage L wrote: Medication: amLODipine (NORVASC) 10 MG tablet   metoprolol succinate (TOPROL-XL) 50 MG 24 hr tablet Has the patient contacted their pharmacy? yes (Agent: If no, request that the patient contact the pharmacy for the refill.) Preferred Pharmacy (with phone number or street name):     [id number] - [place], [place] - [place] [phone] (Phone) [phone] (Fax)     Agent: Please be advised that RX refills may take up to 3 business days. We ask that you follow-up with your pharmacy.

## 2017-09-26 ENCOUNTER — Other Ambulatory Visit: Payer: Self-pay

## 2017-09-26 MED ORDER — ATORVASTATIN CALCIUM 20 MG PO TABS: 20.0000 mg | ORAL_TABLET | Freq: Every day | ORAL | 2 refills | Status: DC

## 2017-11-04 ENCOUNTER — Ambulatory Visit: Payer: Medicare Other | Admitting: Cardiovascular Disease

## 2017-11-04 ENCOUNTER — Encounter: Payer: Self-pay | Admitting: Cardiovascular Disease

## 2017-11-04 VITALS — BP 128/74 | HR 56 | Ht 70.0 in | Wt 196.2 lb

## 2017-11-04 DIAGNOSIS — I10 Essential (primary) hypertension: Secondary | ICD-10-CM | POA: Diagnosis not present

## 2017-11-04 DIAGNOSIS — E782 Mixed hyperlipidemia: Secondary | ICD-10-CM | POA: Diagnosis not present

## 2017-11-04 DIAGNOSIS — R001 Bradycardia, unspecified: Secondary | ICD-10-CM

## 2017-11-04 DIAGNOSIS — I2510 Atherosclerotic heart disease of native coronary artery without angina pectoris: Secondary | ICD-10-CM

## 2017-11-04 MED ORDER — METOPROLOL SUCCINATE ER 25 MG PO TB24: 25.0000 mg | ORAL_TABLET | Freq: Every day | ORAL | 3 refills | Status: DC

## 2017-11-04 NOTE — Patient Instructions (Signed)
Medication Instructions:  1) DECREASE TOPROL to 25 mg daily  Labwork: None  Testing/Procedures: None  Follow-Up: Your provider wants you to follow-up in: 1 year with Dr. Burt Knack. You will receive a reminder letter in the mail two months in advance. If you don't receive a letter, please call our office to schedule the follow-up appointment.    Any Other Special Instructions Will Be Listed Below (If Applicable).     If you need a refill on your cardiac medications before your next appointment, please call your pharmacy.

## 2017-11-04 NOTE — Progress Notes (Signed)
Cardiology Office Note Date:  11/04/2017   ID:  Christian Kim, DOB 04/03/1946, MRN [id number]  PCP:  Ria Bush, MD  Cardiologist:  Sherren Mocha, MD    Chief Complaint  Patient presents with  . Follow-up    CAD     History of Present Illness: Christian Kim is a 72 y.o. male who presents for follow-up of coronary artery disease. He initially presented in 2008 with a non-ST elevation infarct. At that time he underwent complex PCI of the left circumflex and a long area of total occlusion was treated with overlapping drug-eluting stents. He then underwent staged PCI of the right coronary artery during that same hospital admission. He was noted to have moderate LAD stenosis and this is been managed medically.  He had a stress echocardiogram last year to evaluate atypical right-sided chest pain and this was normal.  The patient is here alone today.  He is doing very well.  He denies chest pain, chest pressure, lightheadedness, heart palpitations, leg swelling, or shortness of breath.   Past Medical History:  Diagnosis Date  . CAD (coronary artery disease) 2008   stent  . CAP (community acquired pneumonia) 03/07/2015  . Choledocholithiasis    recurrent/multiple ERCPs with stone extraction and sphincterotomy   . CHOLEDOCHOLITHIASIS 09/20/2007   Qualifier: Diagnosis of  By: Roxan Hockey, [place]    . GERD (gastroesophageal reflux disease)   . Gout 2013   dx by crystal analysis  . Hyperlipidemia   . Hypertension   . Myocardial infarction ([place]) 12-2006  . Prostatitis 01/22/2017    Past Surgical History:  Procedure Laterality Date  . CHOLECYSTECTOMY  2006   w/ revision 2/2 persistent gallstones  . COLONOSCOPY  06/2013   1 polyp, rpt 5 yrs [name])  . CORONARY ANGIOPLASTY WITH STENT PLACEMENT  2008   stent, Dr. Hyman Hopes  . NASAL SEPTUM SURGERY      Current Outpatient Medications  Medication Sig Dispense Refill  . amLODipine (NORVASC) 10 MG tablet Take 1 tablet (10  mg total) by mouth daily. 90 tablet 3  . aspirin 81 MG tablet Take 81 mg by mouth daily.      [name] atorvastatin (LIPITOR) 20 MG tablet Take 1 tablet (20 mg total) by mouth daily. 90 tablet 2  . colchicine 0.6 MG tablet Take 0.6 mg by mouth daily as needed (gout).    [name] ibuprofen (ADVIL,MOTRIN) 200 MG tablet Take 200 mg by mouth every 6 (six) hours as needed for pain.    [name] lisinopril (PRINIVIL,ZESTRIL) 40 MG tablet Take 1 tablet (40 mg total) by mouth daily. 90 tablet 3  . metoprolol succinate (TOPROL-XL) 25 MG 24 hr tablet Take 1 tablet (25 mg total) by mouth daily. 90 tablet 3  . omeprazole (PRILOSEC) 20 MG capsule Take 1 capsule (20 mg total) by mouth daily. 90 capsule 3  . ursodiol (ACTIGALL) 300 MG capsule Take 2 capsules (600 mg total) by mouth 2 (two) times daily. 360 capsule 3   No current facility-administered medications for this visit.     Allergies:   Allopurinol   Social History:  The patient  reports that he has never smoked. He has never used smokeless tobacco. He reports that he drinks alcohol. He reports that he does not use drugs.   Family History:  The patient's  family history includes Liver cancer in his mother; Lung cancer in his mother.   ROS:  Please see the history of present illness. All other systems  are reviewed and negative.   PHYSICAL EXAM: VS:  BP 128/74   Pulse (!) 56   Ht 5\' 10"  (1.778 m)   Wt 196 lb 4 oz (89 kg)   SpO2 95%   BMI 28.16 kg/m  , BMI Body mass index is 28.16 kg/m. GEN: Well nourished, well developed, in no acute distress  HEENT: normal  Neck: no JVD, no masses. No carotid bruits Cardiac: bradycardic and regular without murmur or gallop            Respiratory:  clear to auscultation bilaterally, normal work of breathing GI: soft, nontender, nondistended, + BS MS: no deformity or atrophy  Ext: no pretibial edema, pedal pulses 2+= bilaterally Skin: warm and dry, no rash Neuro:  Strength and sensation are intact Psych: euthymic mood, full  affect  EKG:  EKG is ordered today. The ekg ordered today shows sinus bradycardia 48 bpm, otherwise within normal limits.  Recent Labs: 06/28/2017: ALT 16; BUN 24; Creatinine, Ser 1.51; Potassium 4.4; Sodium 144   Lipid Panel     Component Value Date/Time   CHOL 105 06/28/2017 1252   TRIG 176.0 (H) 06/28/2017 1252   HDL 28.10 (L) 06/28/2017 1252   CHOLHDL 4 06/28/2017 1252   VLDL 35.2 06/28/2017 1252   LDLCALC 42 06/28/2017 1252   LDLDIRECT 63.0 06/19/2016 1106      Wt Readings from Last 3 Encounters:  11/04/17 196 lb 4 oz (89 kg)  08/22/17 200 lb (90.7 kg)  07/09/17 198 lb (89.8 kg)     Cardiac Studies Reviewed: Stress Echo 10-17-2016: Baseline:  - LV global systolic function was normal. - Normal wall motion; no LV regional wall motion abnormalities.  Peak stress:  - LV global systolic function was vigorous. - Normal wall motion; no LV regional wall motion abnormalities. - No evidence for new LV regional wall motion abnormalities.  ASSESSMENT AND PLAN: 1.  CAD, native vessel, without angina: The patient is doing very well.  I reviewed his stress echocardiogram from last year which demonstrated no ischemic changes.  He continues on aspirin, a statin drug, a beta-blocker, and an ACE inhibitor.  2.  Marked bradycardia, asymptomatic: The patient has marked sinus bradycardia on Toprol-XL 50 mg daily.  He does not appear to have any associated symptoms.  I asked him to reduce his dose to 25 mg daily.  3.  Hyperlipidemia, mixed: He is treated with atorvastatin 20 mg daily with most recent labs demonstrating an LDL cholesterol of 42 mg/dL.  4.  Essential hypertension: Continue amlodipine, atorvastatin, lisinopril, metoprolol succinate.  Current medicines are reviewed with the patient today.  The patient does not have concerns regarding medicines.  Labs/ tests ordered today include:   Orders Placed This Encounter  Procedures  . EKG 12-Lead    Disposition:   FU one  year  Signed, Sherren Mocha, MD  11/04/2017 1:39 PM    [facility] [address], [place], [place]  [address] Phone: [phone]; Fax: [phone]

## 2017-11-25 ENCOUNTER — Ambulatory Visit: Payer: Medicare Other | Admitting: Family Medicine

## 2017-11-25 ENCOUNTER — Encounter: Payer: Self-pay | Admitting: Family Medicine

## 2017-11-25 VITALS — BP 114/66 | HR 62 | Temp 98.6°F | Wt 193.2 lb

## 2017-11-25 DIAGNOSIS — R350 Frequency of micturition: Secondary | ICD-10-CM

## 2017-11-25 DIAGNOSIS — R5383 Other fatigue: Secondary | ICD-10-CM

## 2017-11-25 DIAGNOSIS — R6883 Chills (without fever): Secondary | ICD-10-CM | POA: Diagnosis not present

## 2017-11-25 DIAGNOSIS — R5381 Other malaise: Secondary | ICD-10-CM | POA: Diagnosis not present

## 2017-11-25 LAB — COMPREHENSIVE METABOLIC PANEL
ALT: 21 U/L (ref 0–53)
AST: 16 U/L (ref 0–37)
Albumin: 4.2 g/dL (ref 3.5–5.2)
Alkaline Phosphatase: 95 U/L (ref 39–117)
BUN: 23 mg/dL (ref 6–23)
CO2: 28 mEq/L (ref 19–32)
Calcium: 9.2 mg/dL (ref 8.4–10.5)
Chloride: 106 mEq/L (ref 96–112)
Creatinine, Ser: 1.6 mg/dL — ABNORMAL HIGH (ref 0.40–1.50)
GFR: 45.32 mL/min — ABNORMAL LOW (ref >60.00)
Glucose, Bld: 115 mg/dL — ABNORMAL HIGH (ref 70–99)
Potassium: 4 mEq/L (ref 3.5–5.1)
Sodium: 141 mEq/L (ref 135–145)
Total Bilirubin: 1.9 mg/dL — ABNORMAL HIGH (ref 0.2–1.2)
Total Protein: 7.1 g/dL (ref 6.0–8.3)

## 2017-11-25 LAB — POC URINALSYSI DIPSTICK (AUTOMATED)
Blood, UA: NEGATIVE
GLUCOSE UA: NEGATIVE
Ketones, UA: NEGATIVE
Leukocytes, UA: NEGATIVE
NITRITE UA: NEGATIVE
Protein, UA: POSITIVE — AB
Spec Grav, UA: 1.02 (ref 1.010–1.025)
UROBILINOGEN UA: 0.2 U/dL
pH, UA: 6 (ref 5.0–8.0)

## 2017-11-25 LAB — CBC WITH DIFFERENTIAL/PLATELET
Basophils Absolute: 0 10*3/uL (ref 0.0–0.1)
Basophils Relative: 0.4 % (ref 0.0–3.0)
Eosinophils Absolute: 0.2 10*3/uL (ref 0.0–0.7)
Eosinophils Relative: 2.3 % (ref 0.0–5.0)
HCT: 41.1 % (ref 39.0–52.0)
Hemoglobin: 14.5 g/dL (ref 13.0–17.0)
Lymphocytes Relative: 15.4 % (ref 12.0–46.0)
Lymphs Abs: 1.1 10*3/uL (ref 0.7–4.0)
MCHC: 35.3 g/dL (ref 30.0–36.0)
MCV: 93.1 fl (ref 78.0–100.0)
Monocytes Absolute: 1 10*3/uL (ref 0.1–1.0)
Monocytes Relative: 14.2 % — ABNORMAL HIGH (ref 3.0–12.0)
Neutro Abs: 5 10*3/uL (ref 1.4–7.7)
Neutrophils Relative %: 67.7 % (ref 43.0–77.0)
Platelets: 144 10*3/uL — ABNORMAL LOW (ref 150.0–400.0)
RBC: 4.41 Mil/uL (ref 4.22–5.81)
RDW: 12.7 % (ref 11.5–15.5)
WBC: 7.3 10*3/uL (ref 4.0–10.5)

## 2017-11-25 MED ORDER — CEPHALEXIN 250 MG PO CAPS: 250.0000 mg | ORAL_CAPSULE | Freq: Two times a day (BID) | ORAL | 0 refills | Status: DC

## 2017-11-25 NOTE — Progress Notes (Signed)
Subjective:    Patient ID: Christian Kim, male    DOB: [birth year], 72 y.o.   MRN: [id number]  HPI  72 yo M pt of dr Darnell Level here for fatigue and chills   Friday night woke up in the middle of the night chilled  Put a lot of blankets on  Then started sweating  Stayed on bed all day Saturday  ? Fever   achey in low to mid back  Urine looks more "yellow"  No change in odor  No pain to urinate  No blood in urine Trying to drink more water  Some frequency for several days = improved today  Had uti several years ago   No cough No uri symptoms  No recent tick bites or rash    H/o CAD and HTN and CKD  Lab Results  Component Value Date   CREATININE 1.51 (H) 06/28/2017   BUN 24 (H) 06/28/2017   NA 144 06/28/2017   K 4.4 06/28/2017   CL 105 06/28/2017   CO2 30 06/28/2017   Vit D low in feb at 29 Lab Results  Component Value Date   TSH 2.83 06/19/2016    Lab Results  Component Value Date   WBC 6.4 06/19/2016   HGB 15.3 06/19/2016   HCT 44.1 06/19/2016   MCV 92.6 06/19/2016   PLT 194.0 06/19/2016     UA today  Results for orders placed or performed in visit on 11/25/17  POCT Urinalysis Dipstick (Automated)  Result Value Ref Range   Color, UA amber    Clarity, UA clear    Glucose, UA Negative Negative   Bilirubin, UA 1+    Ketones, UA neg    Spec Grav, UA 1.020 1.010 - 1.025   Blood, UA neg    pH, UA 6.0 5.0 - 8.0   Protein, UA Positive (A) Negative   Urobilinogen, UA 0.2 0.2 or 1.0 E.U./dL   Nitrite, UA neg    Leukocytes, UA Negative Negative    Patient Active Problem List   Diagnosis Date Noted  . Urine frequency 11/25/2017  . Chills 11/25/2017  . Malaise and fatigue 11/25/2017  . Advanced care planning/counseling discussion 06/07/2014  . Encounter for general adult medical examination with abnormal findings 06/07/2014  . Tinea cruris 06/07/2014  . Medicare annual wellness visit, subsequent 06/05/2013  . Onychomycosis 06/05/2013  . History of pneumonia  12/12/2012  . Gout 07/18/2011  . CKD (chronic kidney disease) stage 3, GFR 30-59 ml/min (HCC) 10/06/2010  . Coronary atherosclerosis 10/29/2007  . HLD (hyperlipidemia) 11/12/2006  . Essential hypertension 11/12/2006  . Common bile duct (CBD) obstruction 11/12/2006   Past Medical History:  Diagnosis Date  . CAD (coronary artery disease) 2008   stent  . CAP (community acquired pneumonia) 03/07/2015  . Choledocholithiasis    recurrent/multiple ERCPs with stone extraction and sphincterotomy   . CHOLEDOCHOLITHIASIS 09/20/2007   Qualifier: Diagnosis of  By: Roxan Hockey, [place]    . GERD (gastroesophageal reflux disease)   . Gout 2013   dx by crystal analysis  . Hyperlipidemia   . Hypertension   . Myocardial infarction ([place]) 12-2006  . Prostatitis 01/22/2017   Past Surgical History:  Procedure Laterality Date  . CHOLECYSTECTOMY  2006   w/ revision 2/2 persistent gallstones  . COLONOSCOPY  06/2013   1 polyp, rpt 5 yrs [name])  . CORONARY ANGIOPLASTY WITH STENT PLACEMENT  2008   stent, Dr. Hyman Hopes  . NASAL SEPTUM SURGERY  Social History   Tobacco Use  . Smoking status: Never Smoker  . Smokeless tobacco: Never Used  Substance Use Topics  . Alcohol use: Yes    Comment: rarely drinks - 1 to 2 beers per month  . Drug use: No   Family History  Problem Relation Age of Onset  . Liver cancer Mother        unknown primary origin  . Lung cancer Mother        smoker  . Colon cancer Neg Hx   . Esophageal cancer Neg Hx   . Stomach cancer Neg Hx   . Rectal cancer Neg Hx    Allergies  Allergen Reactions  . Allopurinol Rash   Current Outpatient Medications on File Prior to Visit  Medication Sig Dispense Refill  . amLODipine (NORVASC) 10 MG tablet Take 1 tablet (10 mg total) by mouth daily. 90 tablet 3  . aspirin 81 MG tablet Take 81 mg by mouth daily.      [name] atorvastatin (LIPITOR) 20 MG tablet Take 1 tablet (20 mg total) by mouth daily. 90 tablet 2  . colchicine 0.6 MG  tablet Take 0.6 mg by mouth daily as needed (gout).    [name] ibuprofen (ADVIL,MOTRIN) 200 MG tablet Take 200 mg by mouth every 6 (six) hours as needed for pain.    [name] lisinopril (PRINIVIL,ZESTRIL) 40 MG tablet Take 1 tablet (40 mg total) by mouth daily. 90 tablet 3  . metoprolol succinate (TOPROL-XL) 25 MG 24 hr tablet Take 1 tablet (25 mg total) by mouth daily. 90 tablet 3  . omeprazole (PRILOSEC) 20 MG capsule Take 1 capsule (20 mg total) by mouth daily. 90 capsule 3  . ursodiol (ACTIGALL) 300 MG capsule Take 2 capsules (600 mg total) by mouth 2 (two) times daily. 360 capsule 3   No current facility-administered medications on file prior to visit.     Review of Systems  Constitutional: Positive for chills, diaphoresis and fatigue. Negative for activity change, appetite change and unexpected weight change.  HENT: Negative for congestion, rhinorrhea, sore throat and trouble swallowing.   Eyes: Negative for pain, redness, itching and visual disturbance.  Respiratory: Negative for cough, chest tightness, shortness of breath and wheezing.   Cardiovascular: Negative for chest pain and palpitations.  Gastrointestinal: Negative for abdominal pain, blood in stool, constipation, diarrhea and nausea.  Endocrine: Negative for cold intolerance, heat intolerance, polydipsia and polyuria.  Genitourinary: Positive for flank pain and frequency. Negative for decreased urine volume, difficulty urinating, discharge, dysuria, hematuria, scrotal swelling and urgency.  Musculoskeletal: Positive for back pain. Negative for arthralgias, joint swelling and myalgias.  Skin: Negative for pallor and rash.  Neurological: Negative for dizziness, tremors, weakness, numbness and headaches.  Hematological: Negative for adenopathy. Does not bruise/bleed easily.  Psychiatric/Behavioral: Negative for decreased concentration and dysphoric mood. The patient is not nervous/anxious.        Objective:   Physical Exam    Constitutional: He appears well-developed and well-nourished. No distress.  Well appearing   HENT:  Head: Normocephalic and atraumatic.  Right Ear: External ear normal.  Left Ear: External ear normal.  Nose: Nose normal.  Mouth/Throat: Oropharynx is clear and moist.  Eyes: Pupils are equal, round, and reactive to light. Conjunctivae and EOM are normal. Right eye exhibits no discharge. Left eye exhibits no discharge. No scleral icterus.  Neck: Normal range of motion. Neck supple. No JVD present. Carotid bruit is not present. No thyromegaly present.  Cardiovascular: Normal rate, regular rhythm,  normal heart sounds and intact distal pulses. Exam reveals no gallop.  Pulmonary/Chest: Effort normal and breath sounds normal. No stridor. No respiratory distress. He has no wheezes. He has no rales.  No crackles  Abdominal: Soft. Bowel sounds are normal. He exhibits no distension, no abdominal bruit and no mass. There is no tenderness. There is no rebound and no guarding.  Musculoskeletal: He exhibits no edema.  Lymphadenopathy:    He has no cervical adenopathy.  Neurological: He is alert. He has normal reflexes. He displays normal reflexes. No cranial nerve deficit. Coordination normal.  Skin: Skin is warm and dry. No rash noted. No erythema. No pallor.  No insect bites noted   Psychiatric: He has a normal mood and affect.  Pleasant           Assessment & Plan:   Problem List Items Addressed This Visit      Other   Chills - Primary    Intermittent chills and sweats Nl temp now but suspect fever  tx emp for urinary source with keflex/ cx pending Lab pending- cbc/tick /cmp  Warned to avoid nsaid due to ckd  Adv tylenol prn  Update       Relevant Orders   POCT Urinalysis Dipstick (Automated) (Completed)   Urine Culture   Rocky mtn spotted fvr abs pnl(IgG+IgM)   CBC with Differential/Platelet   Comprehensive metabolic panel   B. burgdorfi antibodies by WB   Malaise and fatigue     Along with chills and sweats intermittent Strongly suspect fever but temp is nl now and feeling better  Some urinary symptoms- tx with keflex and ucx sent  Also lab for cmet/cbc/tick labs  Reassuring exam Follow closely       Relevant Orders   Rocky mtn spotted fvr abs pnl(IgG+IgM)   CBC with Differential/Platelet   Comprehensive metabolic panel   B. burgdorfi antibodies by WB   Urine frequency    With hx of chills/sweats and some back pain  No obst symptoms  UA pos for bili and protein Sent for cx tx emp with keflex  Enc fluids/ water  Known renal dz as well       Relevant Orders   Urine Culture

## 2017-11-25 NOTE — Assessment & Plan Note (Signed)
With hx of chills/sweats and some back pain  No obst symptoms  UA pos for bili and protein Sent for cx tx emp with keflex  Enc fluids/ water  Known renal dz as well

## 2017-11-25 NOTE — Assessment & Plan Note (Signed)
Intermittent chills and sweats Nl temp now but suspect fever  tx emp for urinary source with keflex/ cx pending Lab pending- cbc/tick /cmp  Warned to avoid nsaid due to ckd  Adv tylenol prn  Update

## 2017-11-25 NOTE — Assessment & Plan Note (Signed)
Along with chills and sweats intermittent Strongly suspect fever but temp is nl now and feeling better  Some urinary symptoms- tx with keflex and ucx sent  Also lab for cmet/cbc/tick labs  Reassuring exam Follow closely

## 2017-11-25 NOTE — Patient Instructions (Addendum)
Please get a thermometer and start tracking your temperature   Tylenol is ok for chills /fever symptoms  Do not take ibuprofen due to your kidney problems   Keep drinking lots of water   Take the keflex as directed for possible uti  We will get a urine culture and call you with a result   Stay out  of the heat and hydrate well until you are feeling better !  Labs today for tick fever and blood count as well

## 2017-11-26 LAB — URINE CULTURE
MICRO NUMBER:: 90781525
Result:: NO GROWTH
SPECIMEN QUALITY: ADEQUATE

## 2017-11-27 LAB — B. BURGDORFI ANTIBODIES BY WB
B BURGDORFERI IGM ABS (IB): NEGATIVE
B burgdorferi IgG Abs (IB): NEGATIVE
LYME DISEASE 23 KD IGM: NONREACTIVE
LYME DISEASE 39 KD IGG: NONREACTIVE
LYME DISEASE 39 KD IGM: NONREACTIVE
LYME DISEASE 41 KD IGG: REACTIVE — AB
LYME DISEASE 45 KD IGG: NONREACTIVE
LYME DISEASE 66 KD IGG: NONREACTIVE
LYME DISEASE 93 KD IGG: NONREACTIVE
Lyme Disease 18 kD IgG: NONREACTIVE
Lyme Disease 23 kD IgG: NONREACTIVE
Lyme Disease 28 kD IgG: NONREACTIVE
Lyme Disease 30 kD IgG: NONREACTIVE
Lyme Disease 41 kD IgM: NONREACTIVE
Lyme Disease 58 kD IgG: NONREACTIVE

## 2017-11-27 LAB — ROCKY MTN SPOTTED FVR ABS PNL(IGG+IGM)
RMSF IGM: NOT DETECTED
RMSF IgG: NOT DETECTED

## 2018-04-05 ENCOUNTER — Other Ambulatory Visit: Payer: Self-pay | Admitting: Family Medicine

## 2018-04-05 DIAGNOSIS — K219 Gastro-esophageal reflux disease without esophagitis: Secondary | ICD-10-CM

## 2018-06-29 ENCOUNTER — Other Ambulatory Visit: Payer: Self-pay | Admitting: Family Medicine

## 2018-06-29 DIAGNOSIS — E785 Hyperlipidemia, unspecified: Secondary | ICD-10-CM

## 2018-06-29 DIAGNOSIS — N183 Chronic kidney disease, stage 3 unspecified: Secondary | ICD-10-CM

## 2018-06-29 DIAGNOSIS — E559 Vitamin D deficiency, unspecified: Secondary | ICD-10-CM | POA: Insufficient documentation

## 2018-06-29 DIAGNOSIS — M1A9XX0 Chronic gout, unspecified, without tophus (tophi): Secondary | ICD-10-CM

## 2018-07-03 ENCOUNTER — Ambulatory Visit: Payer: Medicare Other

## 2018-07-08 ENCOUNTER — Ambulatory Visit (INDEPENDENT_AMBULATORY_CARE_PROVIDER_SITE_OTHER): Payer: Medicare Other

## 2018-07-08 VITALS — BP 122/76 | HR 63 | Temp 97.7°F | Ht 71.0 in | Wt 204.5 lb

## 2018-07-08 DIAGNOSIS — Z0000 Encounter for general adult medical examination without abnormal findings: Secondary | ICD-10-CM

## 2018-07-08 DIAGNOSIS — E559 Vitamin D deficiency, unspecified: Secondary | ICD-10-CM | POA: Diagnosis not present

## 2018-07-08 DIAGNOSIS — E785 Hyperlipidemia, unspecified: Secondary | ICD-10-CM

## 2018-07-08 DIAGNOSIS — M1A9XX0 Chronic gout, unspecified, without tophus (tophi): Secondary | ICD-10-CM | POA: Diagnosis not present

## 2018-07-08 DIAGNOSIS — N183 Chronic kidney disease, stage 3 unspecified: Secondary | ICD-10-CM

## 2018-07-08 LAB — COMPREHENSIVE METABOLIC PANEL
ALT: 15 U/L (ref 0–53)
AST: 14 U/L (ref 0–37)
Albumin: 4.3 g/dL (ref 3.5–5.2)
Alkaline Phosphatase: 100 U/L (ref 39–117)
BUN: 20 mg/dL (ref 6–23)
CHLORIDE: 104 meq/L (ref 96–112)
CO2: 29 mEq/L (ref 19–32)
Calcium: 9.3 mg/dL (ref 8.4–10.5)
Creatinine, Ser: 1.4 mg/dL (ref 0.40–1.50)
GFR: 49.66 mL/min — ABNORMAL LOW (ref >60.00)
Glucose, Bld: 96 mg/dL (ref 70–99)
Potassium: 4.4 mEq/L (ref 3.5–5.1)
Sodium: 140 mEq/L (ref 135–145)
Total Bilirubin: 1.3 mg/dL — ABNORMAL HIGH (ref 0.2–1.2)
Total Protein: 6.8 g/dL (ref 6.0–8.3)

## 2018-07-08 LAB — CBC WITH DIFFERENTIAL/PLATELET
Basophils Absolute: 0 10*3/uL (ref 0.0–0.1)
Basophils Relative: 0.4 % (ref 0.0–3.0)
EOS PCT: 3.6 % (ref 0.0–5.0)
Eosinophils Absolute: 0.2 10*3/uL (ref 0.0–0.7)
HCT: 42.1 % (ref 39.0–52.0)
Hemoglobin: 14.8 g/dL (ref 13.0–17.0)
Lymphocytes Relative: 20.9 % (ref 12.0–46.0)
Lymphs Abs: 1.3 10*3/uL (ref 0.7–4.0)
MCHC: 35.1 g/dL (ref 30.0–36.0)
MCV: 92.5 fl (ref 78.0–100.0)
Monocytes Absolute: 0.6 10*3/uL (ref 0.1–1.0)
Monocytes Relative: 10.1 % (ref 3.0–12.0)
Neutro Abs: 3.9 10*3/uL (ref 1.4–7.7)
Neutrophils Relative %: 65 % (ref 43.0–77.0)
Platelets: 180 10*3/uL (ref 150.0–400.0)
RBC: 4.56 Mil/uL (ref 4.22–5.81)
RDW: 13.4 % (ref 11.5–15.5)
WBC: 6.1 10*3/uL (ref 4.0–10.5)

## 2018-07-08 LAB — LDL CHOLESTEROL, DIRECT: Direct LDL: 57 mg/dL

## 2018-07-08 LAB — LIPID PANEL
Cholesterol: 110 mg/dL (ref 0–200)
HDL: 27.8 mg/dL — AB (ref >39.00)
NonHDL: 82.37
Total CHOL/HDL Ratio: 4
Triglycerides: 242 mg/dL — ABNORMAL HIGH (ref 0.0–149.0)
VLDL: 48.4 mg/dL — ABNORMAL HIGH (ref 0.0–40.0)

## 2018-07-08 LAB — VITAMIN D 25 HYDROXY (VIT D DEFICIENCY, FRACTURES): VITD: 33.6 ng/mL (ref 30.00–100.00)

## 2018-07-08 LAB — URIC ACID: URIC ACID, SERUM: 8.4 mg/dL — AB (ref 4.0–7.8)

## 2018-07-08 NOTE — Progress Notes (Signed)
PCP notes:  Health maintenance:  No gaps identified.  Abnormal screenings:   Fall risk - hx of single fall Fall Risk  07/08/2018 06/28/2017 07/02/2016 06/19/2016 06/14/2015  Falls in the past year? 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 1 No - - -    Hearing - failed  Hearing Screening   125Hz  250Hz  500Hz  1000Hz  2000Hz  3000Hz  4000Hz  6000Hz  8000Hz   Right ear:   40 40 40  40    Left ear:   40 40 40  0     Patient concerns:   None  Nurse concerns:  None  Next PCP appt:   07/10/18 @ 1130  I reviewed health advisor's note, was available for consultation on the day of service listed in this note, and agree with documentation and plan. Elsie Stain, MD.

## 2018-07-08 NOTE — Patient Instructions (Signed)
Christian Kim , Thank you for taking time to come for your Medicare Wellness Visit. I appreciate your ongoing commitment to your health goals. Please review the following plan we discussed and let me know if I can assist you in the future.   These are the goals we discussed: Goals    . 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        This is a list of the screening recommended for you and due dates:  Health Maintenance  Topic Date Due  . Colon Cancer Screening  07/16/2018  . DTaP/Tdap/Td vaccine (2 - Td) 06/13/2025  . Tetanus Vaccine  06/13/2025  . Flu Shot  Completed  .  Hepatitis C: One time screening is recommended by Center for Disease Control  (CDC) for  adults born from 18 through 1965.   Completed  . Pneumonia vaccines  Completed   Preventive Care for Adults  A healthy lifestyle and preventive care can promote health and wellness. Preventive health guidelines for adults include the following key practices.  . A routine yearly physical is a good way to check with your health care provider about your health and preventive screening. It is a chance to share any concerns and updates on your health and to receive a thorough exam.  . Visit your dentist for a routine exam and preventive care every 6 months. Brush your teeth twice a day and floss once a day. Good oral hygiene prevents tooth decay and gum disease.  . The frequency of eye exams is based on your age, health, family medical history, use  of contact lenses, and other factors. Follow your health care provider's recommendations for frequency of eye exams.  . Eat a healthy diet. Foods like vegetables, fruits, whole grains, low-fat dairy products, and lean protein foods contain the nutrients you need without too many calories. Decrease your intake of foods high in solid fats, added sugars, and salt.  Eat the right amount of calories for you. Get information about a proper diet from your health care provider, if necessary.  . Regular physical exercise is one of the most important things you can do for your health. Most adults should get at least 150 minutes of moderate-intensity exercise (any activity that increases your heart rate and causes you to sweat) each week. In addition, most adults need muscle-strengthening exercises on 2 or more days a week.  Silver Sneakers may be a benefit available to you. To determine eligibility, you may visit the website: www.silversneakers.com or contact program at 252-455-9074 Mon-Fri between 8AM-8PM.   . Maintain a healthy weight. The body mass index (BMI) is a screening tool to identify possible weight problems. It provides an estimate of body fat based on height and weight. Your health care provider can find your BMI and can help you achieve or maintain a healthy weight.   For adults 20 years and older: ? A BMI below 18.5 is considered underweight. ? A BMI of 18.5 to 24.9 is normal. ? A BMI of 25 to 29.9 is considered overweight. ? A BMI of 30 and above is considered obese.   . Maintain normal blood lipids and cholesterol levels by exercising and minimizing your intake of saturated fat. Eat a balanced diet with plenty of fruit and vegetables. Blood tests for lipids and cholesterol should begin at age  20 and be repeated every 5 years. If your lipid or cholesterol levels are high, you are over 50, or you are at high risk for heart disease, you may need your cholesterol levels checked more frequently. Ongoing high lipid and cholesterol levels should be treated with medicines if diet and exercise are not working.  . If you smoke, find out from your health care provider how to quit. If you do not use tobacco, please do not start.  . If you choose to drink alcohol, please do not consume more than 2 drinks per day. One drink is considered to be 12 ounces (355  mL) of beer, 5 ounces (148 mL) of wine, or 1.5 ounces (44 mL) of liquor.  . If you are 5-24 years old, ask your health care provider if you should take aspirin to prevent strokes.  . Use sunscreen. Apply sunscreen liberally and repeatedly throughout the day. You should seek shade when your shadow is shorter than you. Protect yourself by wearing long sleeves, pants, a wide-brimmed hat, and sunglasses year round, whenever you are outdoors.  . Once a month, do a whole body skin exam, using a mirror to look at the skin on your back. Tell your health care provider of new moles, moles that have irregular borders, moles that are larger than a pencil eraser, or moles that have changed in shape or color.

## 2018-07-08 NOTE — Progress Notes (Signed)
Subjective:   Christian Kim is a 73 y.o. male who presents for Medicare Annual/Subsequent preventive examination.  Review of Systems:  N/A Cardiac Risk Factors include: advanced age (>77men, >13 women);male gender;dyslipidemia;hypertension     Objective:    Vitals: BP 122/76 (BP Location: Right Arm, Patient Position: Sitting, Cuff Size: Normal)   Pulse 63   Temp 97.7 F (36.5 C) (Oral)   Ht 5\' 11"  (1.803 m) Comment: boots  Wt 204 lb 8 oz (92.8 kg)   SpO2 95%   BMI 28.52 kg/m   Body mass index is 28.52 kg/m.  Advanced Directives 07/08/2018 06/28/2017 06/19/2016  Does Patient Have a Medical Advance Directive? Yes Yes No  Type of Paramedic of [place];Living will [place];Living will -  Copy of [place] in Chart? No - copy requested Yes -    Tobacco Social History   Tobacco Use  Smoking Status Never Smoker  Smokeless Tobacco Never Used     Counseling given: No   Clinical Intake:  Pre-visit preparation completed: Yes  Pain : No/denies pain Pain Score: 0-No pain     Nutritional Status: BMI 25 -29 Overweight Nutritional Risks: None  How often do you need to have someone help you when you read instructions, pamphlets, or other written materials from your doctor or pharmacy?: 1 - Never What is the last grade level you completed in school?: Associate degree  Interpreter Needed?: No  Comments: pt lives with spouse Information entered by :: LPinson, LPN  Past Medical History:  Diagnosis Date  . CAD (coronary artery disease) 2008   stent  . CAP (community acquired pneumonia) 03/07/2015  . Choledocholithiasis    recurrent/multiple ERCPs with stone extraction and sphincterotomy   . CHOLEDOCHOLITHIASIS 09/20/2007   Qualifier: Diagnosis of  By: Roxan Hockey, [place]    . GERD (gastroesophageal reflux disease)   . Gout 2013   dx by crystal analysis  . Hyperlipidemia   . Hypertension   .  Myocardial infarction ([place]) 12-2006  . Prostatitis 01/22/2017   Past Surgical History:  Procedure Laterality Date  . CHOLECYSTECTOMY  2006   w/ revision 2/2 persistent gallstones  . COLONOSCOPY  06/2013   1 polyp, rpt 5 yrs [name])  . CORONARY ANGIOPLASTY WITH STENT PLACEMENT  2008   stent, Dr. Hyman Hopes  . NASAL SEPTUM SURGERY     Family History  Problem Relation Age of Onset  . Liver cancer Mother        unknown primary origin  . Lung cancer Mother        smoker  . Colon cancer Neg Hx   . Esophageal cancer Neg Hx   . Stomach cancer Neg Hx   . Rectal cancer Neg Hx    Social History   Socioeconomic History  . Marital status: Married    Spouse name: Not on file  . Number of children: 2  . Years of education: Not on file  . Highest education level: Not on file  Occupational History  . Occupation: retired Academic librarian  . 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 Financial resource strain: Not on file  . Food insecurity:    Worry: Not on file    Inability: Not on file  . Transportation needs:    Medical: Not on file    Non-medical: Not on file  Tobacco Use  . Smoking  status: Never Smoker  . Smokeless tobacco: Never Used  Substance and Sexual Activity  . Alcohol use: Yes    Comment: rarely drinks - 1 to 2 beers per month  . Drug use: No  . Sexual activity: Yes  Lifestyle  . Physical activity:    Days per week: Not on file    Minutes per session: Not on file  . Stress: Not on file  Relationships  . Social connections:    Talks on phone: Not on file    Gets together: Not on file    Attends religious service: Not on file    Active member of club or organization: Not on file    Attends meetings of clubs or organizations: Not on file    Relationship status: Not on file  Other Topics Concern  . 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   Activity: works farm to stay active, rabbit hunting   Diet: good water, fruits/vegetables daily    Outpatient Encounter Medications as of 07/08/2018  Medication Sig  . amLODipine (NORVASC) 10 MG tablet Take 1 tablet (10 mg total) by mouth daily.  [name] aspirin 81 MG tablet Take 81 mg by mouth daily.    [name] atorvastatin (LIPITOR) 20 MG tablet TAKE 1 TABLET BY MOUTH ONCE A DAY  . colchicine 0.6 MG tablet Take 0.6 mg by mouth daily as needed (gout).  [name] ibuprofen (ADVIL,MOTRIN) 200 MG tablet Take 200 mg by mouth every 6 (six) hours as needed for pain.  [name] lisinopril (PRINIVIL,ZESTRIL) 40 MG tablet Take 1 tablet (40 mg total) by mouth daily.  . metoprolol succinate (TOPROL-XL) 25 MG 24 hr tablet Take 1 tablet (25 mg total) by mouth daily.  [name] omeprazole (PRILOSEC) 20 MG capsule TAKE 1 CAPSULE BY MOUTH ONCE DAILY  . ursodiol (ACTIGALL) 300 MG capsule Take 2 capsules (600 mg total) by mouth 2 (two) times daily.  . [DISCONTINUED] cephALEXin (KEFLEX) 250 MG capsule Take 1 capsule (250 mg total) by mouth 2 (two) times daily.   No facility-administered encounter medications on file as of 07/08/2018.     Activities of Daily Living In your present state of health, do you have any difficulty performing the following activities: 07/08/2018  Hearing? N  Vision? N  Difficulty concentrating or making decisions? N  Walking or climbing stairs? N  Dressing or bathing? N  Doing errands, shopping? N  Preparing Food and eating ? N  Using the Toilet? N  In the past six months, have you accidently leaked urine? N  Do you have problems with loss of bowel control? N  Managing your Medications? N  Managing your Finances? N  Housekeeping or managing your Housekeeping? N  Some recent data might be hidden    Patient Care Team: Ria Bush, MD as PCP - General (Family Medicine) Sherren Mocha, MD as PCP - Cardiology (Cardiology)     Assessment:   This is a routine wellness examination for Christian Kim.   Hearing Screening   125Hz  250Hz  500Hz  1000Hz  2000Hz  3000Hz  4000Hz  6000Hz  8000Hz   Right ear:   40 40 40  40    Left ear:   40 40 40  0      Visual Acuity Screening   Right eye Left eye Both eyes  Without correction: 20/40 20/40 20/25-1  With correction:  Comments: Future appt scheduled in March 2020   Exercise Activities and Dietary recommendations Current Exercise Habits: The patient has a physically strenuous job, but has no regular exercise apart from work., Exercise limited by: None identified  Goals    . 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        Fall Risk Fall Risk  07/08/2018 06/28/2017 07/02/2016 06/19/2016 06/14/2015  Falls in the past year? 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 1 No - - -   Depression Screen PHQ 2/9 Scores 07/08/2018 06/28/2017 06/19/2016 06/14/2015  PHQ - 2 Score 0 0 0 0  PHQ- 9 Score 0 0 - -    Cognitive Function MMSE - Mini Mental State Exam 07/08/2018 06/28/2017 06/19/2016  Orientation to time 5 5 5   Orientation to Place 5 5 5   Registration 3 3 3   Attention/ Calculation 0 0 0  Recall 3 3 3   Language- name 2 objects 0 0 0  Language- repeat 1 1 1   Language- follow 3 step command 3 3 3   Language- read & follow direction 0 0 0  Write a sentence 0 0 0  Copy design 0 0 0  Total score 20 20 20      PLEASE NOTE: A Mini-Cog screen was completed. Maximum score is 20. A value of 0 denotes this part of [place] MMSE was not completed or the patient failed this part of the Mini-Cog screening.   Mini-Cog Screening Orientation to Time - Max 5 pts Orientation to Place - Max 5 pts Registration - Max 3 pts Recall - Max 3  pts Language Repeat - Max 1 pts Language Follow 3 Step Command - Max 3 pts     Immunization History  Administered Date(s) Administered  . Influenza Whole 03/09/2009, 02/26/2011, 02/26/2012  . Influenza, High Dose Seasonal PF 02/03/2014, 04/15/2015  . Influenza,inj,Quad PF,6+ Mos 03/04/2013, 03/08/2018  . Influenza-Unspecified 02/26/2016, 02/25/2017  . Pneumococcal Conjugate-13 06/07/2014  . Pneumococcal Polysaccharide-23 01/09/2011  . Td 01/29/2002  . Tdap 06/14/2015  . Zoster 01/09/2011    Screening Tests Health Maintenance  Topic Date Due  . COLONOSCOPY  07/16/2018  . DTaP/Tdap/Td (2 - Td) 06/13/2025  . TETANUS/TDAP  06/13/2025  . INFLUENZA VACCINE  Completed  . Hepatitis C Screening  Completed  . PNA vac Low Risk Adult  Completed       Plan:     I have personally reviewed, addressed, and noted the following in the patient's chart:  A. Medical and social history B. Use of alcohol, tobacco or illicit drugs  C. Current medications and supplements D. Functional ability and status E.  Nutritional status F.  Physical activity G. Advance directives H. List of other physicians I.  Hospitalizations, surgeries, and ER visits in previous 12 months J.  [place] to include hearing, vision, cognitive, depression L. Referrals and appointments - none  In addition, I have reviewed and discussed with patient certain preventive protocols, quality metrics, and best practice recommendations. A written personalized care plan for preventive services as well as general preventive health recommendations were provided to patient.  See attached scanned questionnaire for additional information.  Signed,   Lindell Noe, MHA, BS, LPN Health Coach

## 2018-07-10 ENCOUNTER — Ambulatory Visit (INDEPENDENT_AMBULATORY_CARE_PROVIDER_SITE_OTHER): Payer: Medicare Other | Admitting: Family Medicine

## 2018-07-10 ENCOUNTER — Encounter: Payer: Self-pay | Admitting: Family Medicine

## 2018-07-10 VITALS — BP 130/70 | HR 60 | Temp 98.2°F | Ht 71.0 in | Wt 203.4 lb

## 2018-07-10 DIAGNOSIS — E785 Hyperlipidemia, unspecified: Secondary | ICD-10-CM

## 2018-07-10 DIAGNOSIS — M1A9XX0 Chronic gout, unspecified, without tophus (tophi): Secondary | ICD-10-CM

## 2018-07-10 DIAGNOSIS — Z0000 Encounter for general adult medical examination without abnormal findings: Secondary | ICD-10-CM | POA: Diagnosis not present

## 2018-07-10 DIAGNOSIS — I10 Essential (primary) hypertension: Secondary | ICD-10-CM

## 2018-07-10 DIAGNOSIS — N183 Chronic kidney disease, stage 3 unspecified: Secondary | ICD-10-CM

## 2018-07-10 DIAGNOSIS — E559 Vitamin D deficiency, unspecified: Secondary | ICD-10-CM

## 2018-07-10 DIAGNOSIS — Z7189 Other specified counseling: Secondary | ICD-10-CM | POA: Diagnosis not present

## 2018-07-10 DIAGNOSIS — K831 Obstruction of bile duct: Secondary | ICD-10-CM

## 2018-07-10 MED ORDER — COLCHICINE 0.6 MG PO TABS: 0.6000 mg | ORAL_TABLET | Freq: Every day | ORAL | 3 refills | Status: DC | PRN

## 2018-07-10 MED ORDER — VITAMIN D3 25 MCG (1000 UT) PO CAPS: 1.0000 | ORAL_CAPSULE | Freq: Every day | ORAL | Status: AC

## 2018-07-10 NOTE — Assessment & Plan Note (Signed)
Chronic, stable. Continue current regimen. 

## 2018-07-10 NOTE — Assessment & Plan Note (Signed)
Managed with PRN colchicine. Gout levels rising. Discussed low urate/purine diet. Declines daily preventative medicine at this time.

## 2018-07-10 NOTE — Assessment & Plan Note (Signed)
Preventative protocols reviewed and updated unless pt declined. Discussed healthy diet and lifestyle.  

## 2018-07-10 NOTE — Patient Instructions (Addendum)
Call GI [name]) to ask about scheduling colonoscopy [phone]) If interested, check with pharmacy about new 2 shot shingles series (shingrix).  Bring [place] a copy of your advanced directive at your convenience.  Consider daily gout medicine for prevention especially if recurrent gout attacks. Continue colchicine as needed for acute gout attacks.  Make sure to stay well hydrated with plenty of water to help kidneys work well. Return as needed or in 1 year for next physical and wellness visit.   Health Maintenance After Age 73 After age 5, you are at a higher risk for certain long-term diseases and infections as well as injuries from falls. Falls are a major cause of broken bones and head injuries in people who are older than age 29. Getting regular preventive care can help to keep you healthy and well. Preventive care includes getting regular testing and making lifestyle changes as recommended by your health care provider. Talk with your health care provider about:  Which screenings and tests you should have. A screening is a test that checks for a disease when you have no symptoms.  A diet and exercise plan that is right for you. What should I know about screenings and tests to prevent falls? Screening and testing are the best ways to find a health problem early. Early diagnosis and treatment give you the best chance of managing medical conditions that are common after age 45. Certain conditions and lifestyle choices may make you more likely to have a fall. Your health care provider may recommend:  Regular vision checks. Poor vision and conditions such as cataracts can make you more likely to have a fall. If you wear glasses, make sure to get your prescription updated if your vision changes.  Medicine review. Work with your health care provider to regularly review all of the medicines you are taking, including over-the-counter medicines. Ask your health care provider about any side effects that  may make you more likely to have a fall. Tell your health care provider if any medicines that you take make you feel dizzy or sleepy.  Osteoporosis screening. Osteoporosis is a condition that causes the bones to get weaker. This can make the bones weak and cause them to break more easily.  Blood pressure screening. Blood pressure changes and medicines to control blood pressure can make you feel dizzy.  Strength and balance checks. Your health care provider may recommend certain tests to check your strength and balance while standing, walking, or changing positions.  Foot health exam. Foot pain and numbness, as well as not wearing proper footwear, can make you more likely to have a fall.  Depression screening. You may be more likely to have a fall if you have a fear of falling, feel emotionally low, or feel unable to do activities that you used to do.  Alcohol use screening. Using too much alcohol can affect your balance and may make you more likely to have a fall. What actions can I take to lower my risk of falls? General instructions  Talk with your health care provider about your risks for falling. Tell your health care provider if: ? You fall. Be sure to tell your health care provider about all falls, even ones that seem minor. ? You feel dizzy, sleepy, or off-balance.  Take over-the-counter and prescription medicines only as told by your health care provider. These include any supplements.  Eat a healthy diet and maintain a healthy weight. A healthy diet includes low-fat dairy products, low-fat (lean)  meats, and fiber from whole grains, beans, and lots of fruits and vegetables. Home safety  Remove any tripping hazards, such as rugs, cords, and clutter.  Install safety equipment such as grab bars in bathrooms and safety rails on stairs.  Keep rooms and walkways well-lit. Activity   Follow a regular exercise program to stay fit. This will help you maintain your balance. Ask your  health care provider what types of exercise are appropriate for you.  If you need a cane or walker, use it as recommended by your health care provider.  Wear supportive shoes that have nonskid soles. Lifestyle  Do not drink alcohol if your health care provider tells you not to drink.  If you drink alcohol, limit how much you have: ? 0-1 drink a day for women. ? 0-2 drinks a day for men.  Be aware of how much alcohol is in your drink. In the [place] one drink equals one typical bottle of beer (12 oz), one-half glass of wine (5 oz), or one shot of hard liquor (1 oz).  Do not use any products that contain nicotine or tobacco, such as cigarettes and e-cigarettes. If you need help quitting, ask your health care provider. Summary  Having a healthy lifestyle and getting preventive care can help to protect your health and wellness after age 11.  Screening and testing are the best way to find a health problem early and help you avoid having a fall. Early diagnosis and treatment give you the best chance for managing medical conditions that are more common for people who are older than age 15.  Falls are a major cause of broken bones and head injuries in people who are older than age 42. Take precautions to prevent a fall at home.  Work with your health care provider to learn what changes you can make to improve your health and wellness and to prevent falls. This information is not intended to replace advice given to you by your health care provider. Make sure you discuss any questions you have with your health care provider. Document Released: 03/27/2017 Document Revised: 03/27/2017 Document Reviewed: 03/27/2017 Elsevier Interactive Patient Education  2019 [employer].

## 2018-07-10 NOTE — Assessment & Plan Note (Signed)
Reviewed diagnosis with patient. Encouraged increased water intake and avoiding NSAIDs

## 2018-07-10 NOTE — Progress Notes (Signed)
BP 130/70 (BP Location: Left Arm, Patient Position: Sitting, Cuff Size: Normal)   Pulse 60   Temp 98.2 F (36.8 C) (Oral)   Ht 5\' 11"  (1.803 m)   Wt 203 lb 6 oz (92.3 kg)   SpO2 96%   BMI 28.37 kg/m    CC: CPE Subjective:    Patient ID: Christian Kim, male    DOB: 03-Jun-1945, 73 y.o.   MRN: [id number]  HPI: Christian Kim is a 73 y.o. male presenting on 07/10/2018 for Annual Exam (Pt 2.)   Saw Katha Cabal this week for medicare wellness visit. Note reviewed.    Chronic ursodiol use 2/2 recurrentcholedocholithiasisafter cholecystectomy. Worse with fatty greasy foods.Prilosec has also significantly helped. Sees GI Dr [name].   Currently has irritated hemorrhoids treating with prep H.  Recent gout flare treated with colchicine with benefit.   Preventative: COLONOSCOPY Date: 06/2013 1 polyp, rpt 5 yrs [name]). Due this year.  Prostate cancer screening - has previously had routinely checked but not in last few years.Denies nocturia or LUTS sxs. Agrees today - may opt to stop screening. Will need PSA next visit (not done today0 Flu shot yearly. Pneumovax 2012, prevnar 05/2014 Tdap 05/2015 Zostavax 2012.  shingrix - discussed  Advanced directives: Would want wife to be HCPOA. Has living will at home. [place] with life support if possibility of reversal but doesn't want prolonged life support if terminal condition. Asked to bring [place] a copy.  Seat belt use discussed  Sunscreen use discussed. No changing moles on skin.Sees dermatologist [place] Crane). 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 Interim medical history since our last visit  reviewed. Allergies and medications reviewed and updated. Outpatient Medications Prior to Visit  Medication Sig Dispense Refill  . amLODipine (NORVASC) 10 MG tablet Take 1 tablet (10 mg total) by mouth daily. 90 tablet 3  . aspirin 81 MG tablet Take 81 mg by mouth daily.      [name] atorvastatin (LIPITOR) 20 MG tablet TAKE 1 TABLET BY MOUTH ONCE A DAY 90 tablet 0  . ibuprofen (ADVIL,MOTRIN) 200 MG tablet Take 200 mg by mouth every 6 (six) hours as needed for pain.    [name] lisinopril (PRINIVIL,ZESTRIL) 40 MG tablet Take 1 tablet (40 mg total) by mouth daily. 90 tablet 3  . metoprolol succinate (TOPROL-XL) 25 MG 24 hr tablet Take 1 tablet (25 mg total) by mouth daily. 90 tablet 3  . omeprazole (PRILOSEC) 20 MG capsule TAKE 1 CAPSULE BY MOUTH ONCE DAILY 90 capsule 0  . ursodiol (ACTIGALL) 300 MG capsule Take 2 capsules (600 mg total) by mouth 2 (two) times daily. 360 capsule 3  . colchicine 0.6 MG tablet Take 0.6 mg by mouth daily as needed (gout).     No facility-administered medications prior to visit.      Per HPI unless specifically indicated in ROS section below Review of Systems  Constitutional: Negative for activity change, appetite change, chills, fatigue, fever and unexpected weight change.  HENT: Negative for hearing loss.   Eyes: Negative for visual disturbance.  Respiratory: Negative for cough, chest tightness, shortness of breath and wheezing.   Cardiovascular: Negative for  chest pain, palpitations and leg swelling.  Gastrointestinal: Negative for abdominal distention, abdominal pain, blood in stool, constipation, diarrhea, nausea and vomiting.  Genitourinary: Negative for difficulty urinating and hematuria.  Musculoskeletal: Negative for arthralgias, myalgias and neck pain.  Skin: Negative for rash.  Neurological: Negative for dizziness, seizures, syncope and headaches.  Hematological: Negative for adenopathy. Does not bruise/bleed easily.  Psychiatric/Behavioral: Negative for  dysphoric mood. The patient is not nervous/anxious.    Objective:    BP 130/70 (BP Location: Left Arm, Patient Position: Sitting, Cuff Size: Normal)   Pulse 60   Temp 98.2 F (36.8 C) (Oral)   Ht 5\' 11"  (1.803 m)   Wt 203 lb 6 oz (92.3 kg)   SpO2 96%   BMI 28.37 kg/m   Wt Readings from Last 3 Encounters:  07/10/18 203 lb 6 oz (92.3 kg)  07/08/18 204 lb 8 oz (92.8 kg)  11/25/17 193 lb 4 oz (87.7 kg)    Physical Exam Vitals signs and nursing note reviewed.  Constitutional:      General: He is not in acute distress.    Appearance: Normal appearance. He is well-developed.  HENT:     Head: Normocephalic and atraumatic.     Right Ear: Hearing, tympanic membrane, ear canal and external ear normal.     Left Ear: Hearing, tympanic membrane, ear canal and external ear normal.     Nose: Nose normal.     Mouth/Throat:     Mouth: Mucous membranes are moist.     Pharynx: Uvula midline. No oropharyngeal exudate or posterior oropharyngeal erythema.  Eyes:     General: No scleral icterus.    Conjunctiva/sclera: Conjunctivae normal.     Pupils: Pupils are equal, round, and reactive to light.  Neck:     Musculoskeletal: Normal range of motion and neck supple.     Vascular: No carotid bruit.  Cardiovascular:     Rate and Rhythm: Normal rate and regular rhythm.     Pulses: Normal pulses.          Radial pulses are 2+ on the right side and 2+ on the left side.     Heart sounds: Normal heart sounds. No murmur.  Pulmonary:     Effort: Pulmonary effort is normal. No respiratory distress.     Breath sounds: Normal breath sounds. No wheezing, rhonchi or rales.  Abdominal:     General: Bowel sounds are normal. There is no distension.     Palpations: Abdomen is soft. There is no mass.     Tenderness: There is no abdominal tenderness. There is no guarding or rebound.  Genitourinary:    Prostate: Normal. Not enlarged (20gm), not tender and no nodules present.     Rectum: Normal. No mass,  tenderness, anal fissure, external hemorrhoid or internal hemorrhoid. Normal anal tone.  Musculoskeletal: Normal range of motion.  Lymphadenopathy:     Cervical: No cervical adenopathy.  Skin:    General: Skin is warm and dry.     Findings: No rash.  Neurological:     Mental Status: He is alert and oriented to person, place, and time.     Comments: CN grossly intact, station and gait intact  Psychiatric:        Behavior: Behavior normal.        Thought Content: Thought content normal.        Judgment: Judgment normal.       Results for orders placed or performed in visit on 07/08/18  VITAMIN  D 25 Hydroxy (Vit-D Deficiency, Fractures)  Result Value Ref Range   VITD 33.60 30.00 - 100.00 ng/mL  Comprehensive metabolic panel  Result Value Ref Range   Sodium 140 135 - 145 mEq/L   Potassium 4.4 3.5 - 5.1 mEq/L   Chloride 104 96 - 112 mEq/L   CO2 29 19 - 32 mEq/L   Glucose, Bld 96 70 - 99 mg/dL   BUN 20 6 - 23 mg/dL   Creatinine, Ser 1.40 0.40 - 1.50 mg/dL   Total Bilirubin 1.3 (H) 0.2 - 1.2 mg/dL   Alkaline Phosphatase 100 39 - 117 U/L   AST 14 0 - 37 U/L   ALT 15 0 - 53 U/L   Total Protein 6.8 6.0 - 8.3 g/dL   Albumin 4.3 3.5 - 5.2 g/dL   Calcium 9.3 8.4 - 10.5 mg/dL   GFR 49.66 (L) >60.00 mL/min  Uric acid  Result Value Ref Range   Uric Acid, Serum 8.4 (H) 4.0 - 7.8 mg/dL  CBC with Differential/Platelet  Result Value Ref Range   WBC 6.1 4.0 - 10.5 K/uL   RBC 4.56 4.22 - 5.81 Mil/uL   Hemoglobin 14.8 13.0 - 17.0 g/dL   HCT 42.1 39.0 - 52.0 %   MCV 92.5 78.0 - 100.0 fl   MCHC 35.1 30.0 - 36.0 g/dL   RDW 13.4 11.5 - 15.5 %   Platelets 180.0 150.0 - 400.0 K/uL   Neutrophils Relative % 65.0 43.0 - 77.0 %   Lymphocytes Relative 20.9 12.0 - 46.0 %   Monocytes Relative 10.1 3.0 - 12.0 %   Eosinophils Relative 3.6 0.0 - 5.0 %   Basophils Relative 0.4 0.0 - 3.0 %   Neutro Abs 3.9 1.4 - 7.7 K/uL   Lymphs Abs 1.3 0.7 - 4.0 K/uL   Monocytes Absolute 0.6 0.1 - 1.0 K/uL    Eosinophils Absolute 0.2 0.0 - 0.7 K/uL   Basophils Absolute 0.0 0.0 - 0.1 K/uL  Lipid panel  Result Value Ref Range   Cholesterol 110 0 - 200 mg/dL   Triglycerides 242.0 (H) 0.0 - 149.0 mg/dL   HDL 27.80 (L) >39.00 mg/dL   VLDL 48.4 (H) 0.0 - 40.0 mg/dL   Total CHOL/HDL Ratio 4    NonHDL 82.37   LDL cholesterol, direct  Result Value Ref Range   Direct LDL 57.0 mg/dL   Assessment & Plan:   Problem List Items Addressed This Visit    Vitamin D deficiency    Continue 1000 IU daily.       HLD (hyperlipidemia)    Chronic, deteriorated triglycerides. Discussed healthy diet choices to affect improved cholesterol levels.  The ASCVD Risk score Mikey Bussing DC Jr., et al., 2013) failed to calculate for the following reasons:   The valid total cholesterol range is 130 to 320 mg/dL       Health maintenance examination - Primary    Preventative protocols reviewed and updated unless pt declined. Discussed healthy diet and lifestyle.       Gout    Managed with PRN colchicine. Gout levels rising. Discussed low urate/purine diet. Declines daily preventative medicine at this time.       Essential hypertension    Chronic, stable. Continue current regimen.       Common bile duct (CBD) obstruction    Sees GI, on ursodiol.      CKD (chronic kidney disease) stage 3, GFR 30-59 ml/min (HCC)    Reviewed diagnosis with patient. Encouraged increased water intake and  avoiding NSAIDs      Advanced care planning/counseling discussion    Advanced directives: Would want wife to be HCPOA. Has living will at home. [place] with life support if possibility of reversal but doesn't want prolonged life support if terminal condition.  Asked to bring [place] a copy.           Meds ordered this encounter  Medications  . colchicine 0.6 MG tablet    Sig: Take 1 tablet (0.6 mg total) by mouth daily as needed (gout).    Dispense:  30 tablet    Refill:  3  . Cholecalciferol (VITAMIN D3) 25 MCG (1000 UT) CAPS    Sig:  Take 1 capsule (1,000 Units total) by mouth daily.    Dispense:  30 capsule   No orders of the defined types were placed in this encounter.   Follow up plan: Return in about 1 year (around 07/11/2019) for annual exam, prior fasting for blood work, medicare wellness visit.  Ria Bush, MD

## 2018-07-10 NOTE — Assessment & Plan Note (Signed)
Continue 1000 IU daily. 

## 2018-07-10 NOTE — Assessment & Plan Note (Signed)
Advanced directives:  Would want wife to be HCPOA. Has living will at home. [place] with life support if possibility of reversal but doesn't want prolonged life support if terminal condition. Asked to bring us a copy.  

## 2018-07-10 NOTE — Assessment & Plan Note (Signed)
Chronic, deteriorated triglycerides. Discussed healthy diet choices to affect improved cholesterol levels.  The ASCVD Risk score Christian Bussing DC Jr., Christian al., Christian Kim) failed to calculate for the following reasons:   The valid total cholesterol range is 130 to 320 mg/dL

## 2018-07-10 NOTE — Assessment & Plan Note (Signed)
Sees GI, on ursodiol.

## 2018-07-25 ENCOUNTER — Telehealth: Payer: Self-pay | Admitting: Cardiovascular Disease

## 2018-07-25 NOTE — Telephone Encounter (Signed)
Per pt call - stated this is his second attempt at making an recall appointment.  He gave a verbal to leave the appt time and date please. (give him a call back)

## 2018-07-25 NOTE — Telephone Encounter (Signed)
Scheduled patient 11/12/2018 at 1020. Instructed him to call back to confirm or cancel appointment.

## 2018-08-28 ENCOUNTER — Other Ambulatory Visit: Payer: Self-pay | Admitting: Family Medicine

## 2018-09-01 ENCOUNTER — Other Ambulatory Visit: Payer: Self-pay | Admitting: Family Medicine

## 2018-09-01 ENCOUNTER — Other Ambulatory Visit: Payer: Self-pay | Admitting: Internal Medicine

## 2018-09-01 DIAGNOSIS — K219 Gastro-esophageal reflux disease without esophagitis: Secondary | ICD-10-CM

## 2018-11-11 ENCOUNTER — Telehealth: Payer: Self-pay

## 2018-11-11 NOTE — Telephone Encounter (Signed)
Called to pre-screen patient for office visit tomorrow. He understands to call if any of the below changes prior to appointment.  COVID-19 Pre-Screening Questions:  . In the past 7 to 10 days have you had a cough,  shortness of breath, headache, congestion, fever (100 or greater) body aches, chills, sore throat, or sudden loss of taste or sense of smell? NO . Have you been around anyone with known Covid 19. NO . Have you been around anyone who is awaiting Covid 19 test results in the past 7 to 10 days? NO . Have you been around anyone who has been exposed to Covid 19, or has mentioned symptoms of Covid 19 within the past 7 to 10 days? NO

## 2018-11-12 ENCOUNTER — Encounter: Payer: Self-pay | Admitting: Cardiovascular Disease

## 2018-11-12 ENCOUNTER — Ambulatory Visit (INDEPENDENT_AMBULATORY_CARE_PROVIDER_SITE_OTHER): Payer: Medicare Other | Admitting: Cardiovascular Disease

## 2018-11-12 ENCOUNTER — Other Ambulatory Visit: Payer: Self-pay

## 2018-11-12 VITALS — BP 132/74 | HR 57 | Ht 70.5 in | Wt 202.4 lb

## 2018-11-12 DIAGNOSIS — I10 Essential (primary) hypertension: Secondary | ICD-10-CM

## 2018-11-12 DIAGNOSIS — I2510 Atherosclerotic heart disease of native coronary artery without angina pectoris: Secondary | ICD-10-CM

## 2018-11-12 DIAGNOSIS — E782 Mixed hyperlipidemia: Secondary | ICD-10-CM

## 2018-11-12 NOTE — Patient Instructions (Signed)
Medication Instructions:  Your provider recommends that you continue on your current medications as directed. Please refer to the Current Medication list given to you today.    Labwork: None  Testing/Procedures: None  Follow-Up: Your provider wants you to follow-up in: 1 year with Dr. Cooper. You will receive a reminder letter in the mail two months in advance. If you don't receive a letter, please call our office to schedule the follow-up appointment.    

## 2018-11-12 NOTE — Progress Notes (Signed)
Cardiology Office Note:    Date:  11/12/2018   ID:  Christian Kim, DOB 01-16-1946, MRN [id number]  PCP:  Ria Bush, MD  Cardiologist:  Sherren Mocha, MD  Electrophysiologist:  None   Referring MD: Ria Bush, MD   Chief Complaint  Patient presents with  . Coronary Artery Disease    History of Present Illness:    LEMONTE Christian Kim is a 73 y.o. male with a hx of coronary artery disease, presenting for follow-up evaluation.  He initially presented in 2008 with a non-ST elevation infarct. At that time he underwent complex PCIofthe left circumflex and a long area of total occlusion was treated with overlapping drug-eluting stents. He then underwent staged PCI of the right coronary artery during that same hospital admission. He was noted to have moderate LAD stenosis and this is been managed medically.    The patient's last ischemic evaluation he had in 2018 involved a stress echocardiogram which demonstrated normal findings with no ischemic changes.  The patient is here alone today.  He is doing well with no symptoms of chest pain, chest pressure, or shortness of breath.  He remains physically active.  His medications have not changed since his evaluation last year.  He has no specific complaints today.  Past Medical History:  Diagnosis Date  . CAD (coronary artery disease) 2008   stent  . CAP (community acquired pneumonia) 03/07/2015  . Choledocholithiasis    recurrent/multiple ERCPs with stone extraction and sphincterotomy   . CHOLEDOCHOLITHIASIS 09/20/2007   Qualifier: Diagnosis of  By: Roxan Hockey, [place]    . GERD (gastroesophageal reflux disease)   . Gout 2013   dx by crystal analysis  . Hyperlipidemia   . Hypertension   . Myocardial infarction ([place]) 12-2006  . Prostatitis 01/22/2017    Past Surgical History:  Procedure Laterality Date  . CHOLECYSTECTOMY  2006   w/ revision 2/2 persistent gallstones  . COLONOSCOPY  06/2013   1 polyp, rpt 5 yrs  [name])  . CORONARY ANGIOPLASTY WITH STENT PLACEMENT  2008   stent, Dr. Hyman Hopes  . NASAL SEPTUM SURGERY      Current Medications: Current Meds  Medication Sig  . amLODipine (NORVASC) 10 MG tablet TAKE 1 TABLET BY MOUTH ONCE A DAY  . aspirin 81 MG tablet Take 81 mg by mouth daily.    [name] atorvastatin (LIPITOR) 20 MG tablet TAKE 1 TABLET BY MOUTH ONCE DAILY  . Cholecalciferol (VITAMIN D3) 25 MCG (1000 UT) CAPS Take 1 capsule (1,000 Units total) by mouth daily.  . colchicine 0.6 MG tablet Take 1 tablet (0.6 mg total) by mouth daily as needed (gout).  [name] ibuprofen (ADVIL,MOTRIN) 200 MG tablet Take 200 mg by mouth every 6 (six) hours as needed for pain.  [name] lisinopril (PRINIVIL,ZESTRIL) 40 MG tablet TAKE 1 TABLET BY MOUTH ONCE A DAY  . omeprazole (PRILOSEC) 20 MG capsule TAKE 1 CAPSULE BY MOUTH ONCE DAILY  . triamcinolone cream (KENALOG) 0.1 % Apply 1 application topically as needed.   . ursodiol (ACTIGALL) 300 MG capsule TAKE 2 CAPSULES BY MOUTH TWICE DAILY     Allergies:   Allopurinol   Social History   Socioeconomic History  . Marital status: Married    Spouse name: Not on file  . Number of children: 2  . Years of education: Not on file  . Highest education level: Not on file  Occupational History  . Occupation: retired Academic librarian  . 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 Financial resource strain: Not on file  . Food insecurity    Worry: Not on file    Inability: Not on file  . Transportation needs    Medical: Not on file    Non-medical: Not on file  Tobacco Use  . Smoking status: Never Smoker  . Smokeless tobacco: Never Used  Substance and Sexual Activity  . Alcohol use: Yes    Comment: rarely drinks - 1 to 2 beers per month  . Drug use: No  . Sexual activity: Yes  Lifestyle  . Physical activity    Days per week: Not on file    Minutes per session: Not on file  . Stress: Not on file   Relationships  . Social Herbalist on phone: Not on file    Gets together: Not on file    Attends religious service: Not on file    Active member of club or organization: Not on file    Attends meetings of clubs or organizations: Not on file    Relationship status: Not on file  Other Topics Concern  . 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 There is no history of Colon cancer, Esophageal cancer, Stomach cancer, or Rectal cancer.  ROS:   Please see the history of present illness.    All other systems reviewed and are negative.  EKGs/Labs/Other Studies Reviewed:    The following studies were reviewed today: Exercise Stress Echo 10/17/2016: Baseline:  - LV global systolic function was normal. - Normal wall motion; no LV regional wall motion abnormalities.  Peak stress:  - LV global systolic function was vigorous. - Normal wall motion; no LV regional wall motion abnormalities. - No evidence for new LV regional wall motion abnormalities.  EKG:  EKG is ordered today.  The ekg ordered today demonstrates sinus bradycardia 52 bpm, otherwise normal  Recent Labs: 07/08/2018: ALT 15; BUN 20; Creatinine, Ser 1.40; Hemoglobin 14.8; Platelets 180.0; Potassium 4.4; Sodium 140  Recent Lipid Panel    Component Value Date/Time   CHOL 110 07/08/2018 0953   TRIG 242.0 (H) 07/08/2018 0953   HDL 27.80 (L) 07/08/2018 0953   CHOLHDL 4 07/08/2018 0953   VLDL 48.4 (H) 07/08/2018 0953   LDLCALC 42 06/28/2017 1252   LDLDIRECT 57.0 07/08/2018 0953    Physical Exam:    VS:  BP 132/74   Pulse (!) 57   Ht 5' 10.5" (1.791 m)   Wt 202  lb 6.4 oz (91.8 kg)   SpO2 94%   BMI 28.63 kg/m     Wt Readings from Last 3 Encounters:  11/12/18 202 lb 6.4 oz (91.8 kg)  07/10/18 203 lb 6 oz (92.3 kg)  07/08/18 204 lb 8 oz (92.8 kg)     GEN: Well nourished, well developed in no acute distress HEENT: Normal NECK: No JVD; No carotid bruits LYMPHATICS: No lymphadenopathy CARDIAC: RRR, no murmurs, rubs, gallops RESPIRATORY:  Clear to auscultation without rales, wheezing or rhonchi  ABDOMEN: Soft, non-tender, non-distended MUSCULOSKELETAL:  No edema;  No deformity  SKIN: Warm and dry NEUROLOGIC:  Alert and oriented x 3 PSYCHIATRIC:  Normal affect   ASSESSMENT:    1. Coronary artery disease involving native coronary artery of native heart without angina pectoris   2. Essential hypertension   3. Mixed hyperlipidemia    PLAN:    In order of problems listed above:  1. The patient is stable on his current medical regimen.  This includes aspirin for antiplatelet therapy, atorvastatin, and a beta-blocker.  No changes are made.  He will return in 1 year for follow-up. 2. Blood pressure is well controlled on a combination of amlodipine, lisinopril, and metoprolol succinate. 3. Lipids are at goal on atorvastatin.  LDL cholesterol is 57 mg/dL.   Medication Adjustments/Labs and Tests Ordered: Current medicines are reviewed at length with the patient today.  Concerns regarding medicines are outlined above.  Orders Placed This Encounter  Procedures  . EKG 12-Lead   No orders of the defined types were placed in this encounter.   There are no Patient Instructions on file for this visit.   Signed, Sherren Mocha, MD  11/12/2018 11:35 AM    [place]

## 2018-11-21 ENCOUNTER — Other Ambulatory Visit: Payer: Self-pay | Admitting: Cardiovascular Disease

## 2019-02-19 ENCOUNTER — Other Ambulatory Visit: Payer: Self-pay | Admitting: Family Medicine

## 2019-02-19 DIAGNOSIS — K219 Gastro-esophageal reflux disease without esophagitis: Secondary | ICD-10-CM

## 2019-07-13 ENCOUNTER — Ambulatory Visit: Payer: Medicare Other

## 2019-07-13 ENCOUNTER — Other Ambulatory Visit (INDEPENDENT_AMBULATORY_CARE_PROVIDER_SITE_OTHER): Payer: Medicare PPO

## 2019-07-13 ENCOUNTER — Other Ambulatory Visit: Payer: Self-pay

## 2019-07-13 ENCOUNTER — Other Ambulatory Visit: Payer: Self-pay | Admitting: Family Medicine

## 2019-07-13 DIAGNOSIS — E559 Vitamin D deficiency, unspecified: Secondary | ICD-10-CM

## 2019-07-13 DIAGNOSIS — M1A9XX0 Chronic gout, unspecified, without tophus (tophi): Secondary | ICD-10-CM

## 2019-07-13 DIAGNOSIS — E785 Hyperlipidemia, unspecified: Secondary | ICD-10-CM

## 2019-07-13 DIAGNOSIS — N183 Chronic kidney disease, stage 3 unspecified: Secondary | ICD-10-CM

## 2019-07-13 LAB — CBC WITH DIFFERENTIAL/PLATELET
Basophils Absolute: 0 10*3/uL (ref 0.0–0.1)
Basophils Relative: 0.7 % (ref 0.0–3.0)
Eosinophils Absolute: 0.4 10*3/uL (ref 0.0–0.7)
Eosinophils Relative: 6.3 % — ABNORMAL HIGH (ref 0.0–5.0)
HCT: 41.5 % (ref 39.0–52.0)
Hemoglobin: 14.2 g/dL (ref 13.0–17.0)
Lymphocytes Relative: 29.4 % (ref 12.0–46.0)
Lymphs Abs: 1.9 10*3/uL (ref 0.7–4.0)
MCHC: 34.3 g/dL (ref 30.0–36.0)
MCV: 91.2 fl (ref 78.0–100.0)
Monocytes Absolute: 0.7 10*3/uL (ref 0.1–1.0)
Monocytes Relative: 10.7 % (ref 3.0–12.0)
Neutro Abs: 3.3 10*3/uL (ref 1.4–7.7)
Neutrophils Relative %: 52.9 % (ref 43.0–77.0)
Platelets: 205 10*3/uL (ref 150.0–400.0)
RBC: 4.55 Mil/uL (ref 4.22–5.81)
RDW: 13.3 % (ref 11.5–15.5)
WBC: 6.3 10*3/uL (ref 4.0–10.5)

## 2019-07-13 LAB — COMPREHENSIVE METABOLIC PANEL
ALT: 15 U/L (ref 0–53)
AST: 13 U/L (ref 0–37)
Albumin: 4.2 g/dL (ref 3.5–5.2)
Alkaline Phosphatase: 111 U/L (ref 39–117)
BUN: 20 mg/dL (ref 6–23)
CO2: 29 mEq/L (ref 19–32)
Calcium: 9.2 mg/dL (ref 8.4–10.5)
Chloride: 105 mEq/L (ref 96–112)
Creatinine, Ser: 1.6 mg/dL — ABNORMAL HIGH (ref 0.40–1.50)
GFR: 42.45 mL/min — ABNORMAL LOW (ref >60.00)
Glucose, Bld: 105 mg/dL — ABNORMAL HIGH (ref 70–99)
Potassium: 4.3 mEq/L (ref 3.5–5.1)
Sodium: 141 mEq/L (ref 135–145)
Total Bilirubin: 0.8 mg/dL (ref 0.2–1.2)
Total Protein: 6.6 g/dL (ref 6.0–8.3)

## 2019-07-13 LAB — LIPID PANEL
Cholesterol: 111 mg/dL (ref 0–200)
HDL: 32.9 mg/dL — ABNORMAL LOW (ref >39.00)
LDL Cholesterol: 38 mg/dL (ref 0–99)
NonHDL: 78.31
Total CHOL/HDL Ratio: 3
Triglycerides: 200 mg/dL — ABNORMAL HIGH (ref 0.0–149.0)
VLDL: 40 mg/dL (ref 0.0–40.0)

## 2019-07-13 LAB — URIC ACID: Uric Acid, Serum: 7.5 mg/dL (ref 4.0–7.8)

## 2019-07-13 LAB — VITAMIN D 25 HYDROXY (VIT D DEFICIENCY, FRACTURES): VITD: 30.8 ng/mL (ref 30.00–100.00)

## 2019-07-14 ENCOUNTER — Ambulatory Visit: Payer: Medicare PPO

## 2019-07-16 ENCOUNTER — Encounter: Payer: Medicare Other | Admitting: Family Medicine

## 2019-07-21 ENCOUNTER — Ambulatory Visit (INDEPENDENT_AMBULATORY_CARE_PROVIDER_SITE_OTHER): Payer: Medicare PPO | Admitting: Family Medicine

## 2019-07-21 ENCOUNTER — Other Ambulatory Visit: Payer: Self-pay

## 2019-07-21 ENCOUNTER — Encounter: Payer: Self-pay | Admitting: Family Medicine

## 2019-07-21 VITALS — BP 128/70 | HR 55 | Temp 97.9°F | Ht 69.25 in | Wt 201.1 lb

## 2019-07-21 DIAGNOSIS — I10 Essential (primary) hypertension: Secondary | ICD-10-CM

## 2019-07-21 DIAGNOSIS — E785 Hyperlipidemia, unspecified: Secondary | ICD-10-CM

## 2019-07-21 DIAGNOSIS — Z0000 Encounter for general adult medical examination without abnormal findings: Secondary | ICD-10-CM

## 2019-07-21 DIAGNOSIS — M1A9XX0 Chronic gout, unspecified, without tophus (tophi): Secondary | ICD-10-CM

## 2019-07-21 DIAGNOSIS — Z125 Encounter for screening for malignant neoplasm of prostate: Secondary | ICD-10-CM

## 2019-07-21 DIAGNOSIS — K831 Obstruction of bile duct: Secondary | ICD-10-CM

## 2019-07-21 DIAGNOSIS — N1832 Chronic kidney disease, stage 3b: Secondary | ICD-10-CM

## 2019-07-21 DIAGNOSIS — Z7189 Other specified counseling: Secondary | ICD-10-CM

## 2019-07-21 DIAGNOSIS — K219 Gastro-esophageal reflux disease without esophagitis: Secondary | ICD-10-CM

## 2019-07-21 DIAGNOSIS — Z1211 Encounter for screening for malignant neoplasm of colon: Secondary | ICD-10-CM

## 2019-07-21 DIAGNOSIS — I2510 Atherosclerotic heart disease of native coronary artery without angina pectoris: Secondary | ICD-10-CM

## 2019-07-21 DIAGNOSIS — E559 Vitamin D deficiency, unspecified: Secondary | ICD-10-CM

## 2019-07-21 MED ORDER — COLCHICINE 0.6 MG PO TABS: 0.6000 mg | ORAL_TABLET | Freq: Every day | ORAL | 3 refills | Status: DC | PRN

## 2019-07-21 MED ORDER — LISINOPRIL 40 MG PO TABS: 40.0000 mg | ORAL_TABLET | Freq: Every day | ORAL | 3 refills | Status: DC

## 2019-07-21 MED ORDER — AMLODIPINE BESYLATE 10 MG PO TABS: 10.0000 mg | ORAL_TABLET | Freq: Every day | ORAL | 3 refills | Status: DC

## 2019-07-21 MED ORDER — ATORVASTATIN CALCIUM 20 MG PO TABS: 20.0000 mg | ORAL_TABLET | Freq: Every day | ORAL | 3 refills | Status: DC

## 2019-07-21 MED ORDER — METOPROLOL SUCCINATE ER 25 MG PO TB24: 25.0000 mg | ORAL_TABLET | Freq: Every day | ORAL | 3 refills | Status: DC

## 2019-07-21 MED ORDER — OMEPRAZOLE 20 MG PO CPDR: 20.0000 mg | DELAYED_RELEASE_CAPSULE | Freq: Every day | ORAL | 3 refills | Status: DC

## 2019-07-21 NOTE — Assessment & Plan Note (Addendum)
Continues ursodiol. Overdue for GI f/u - will refer for colonoscopy.

## 2019-07-21 NOTE — Assessment & Plan Note (Signed)
Managed with PRN colchicine - with several flares per year. Rash to allopurinol in the past.  Check urate when he returns in 3 months for lab visit, if remaining elevated, consider other treatment.

## 2019-07-21 NOTE — Assessment & Plan Note (Signed)
Chronic, LDL at goal on atorvastatin. Trig elevated - reviewed diet choices to improve levels. The ASCVD Risk score Christian Bussing DC Jr., et al., 2013) failed to calculate for the following reasons:   The valid total cholesterol range is 130 to 320 mg/dL

## 2019-07-21 NOTE — Assessment & Plan Note (Signed)
Chronic, stable. Continue current regimen. 

## 2019-07-21 NOTE — Assessment & Plan Note (Signed)
Preventative protocols reviewed and updated unless pt declined. Discussed healthy diet and lifestyle.  

## 2019-07-21 NOTE — Assessment & Plan Note (Signed)
Reviewed latest Cr with patient - as well as importance of fluid intake and avoiding NSAIDs.

## 2019-07-21 NOTE — Progress Notes (Signed)
This visit was conducted in person.  BP 128/70 (BP Location: Left Arm, Patient Position: Sitting, Cuff Size: Normal)   Pulse (!) 55   Temp 97.9 F (36.6 C) (Temporal)   Ht 5' 9.25" (1.759 m)   Wt 201 lb 2 oz (91.2 kg)   SpO2 97%   BMI 29.49 kg/m    CC: CPE Subjective:    Patient ID: Christian Kim, male    DOB: 11/23/1945, 73 y.o.   MRN: [id number]  HPI: Christian Kim is a 74 y.o. male presenting on 07/21/2019 for Medicare Wellness   Did not see health advisor this year   Hearing Screening   125Hz  250Hz  500Hz  1000Hz  2000Hz  3000Hz  4000Hz  6000Hz  8000Hz   Right ear:   20 20 20   0    Left ear:   25 25 20   0    Vision Screening Comments: Last eye exam, summer 2020.     Office Visit from 07/21/2019 in [place] at [facility]  PHQ-2 Total Score  0      Fall Risk  07/21/2019 07/08/2018 06/28/2017 07/02/2016 06/19/2016  Falls in the past year? 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 0 1 No - -  1 fall while sawing wood, tripped over vine.   Chronic ursodiol use 2/2 recurrentcholedocholithiasisafter cholecystectomy.   Preventative: COLONOSCOPY Date: 06/2013 1 SSP, rpt 5 yrs [name]). Due.  Prostate cancer screening - has previously had routinely checked but not in last few years.Denies nocturia or LUTS sxs. Agrees today - may opt to stop screening. Due for PSA - not checked since 2019 Flu shot yearly. Pneumovax 2012, prevnar 05/2014 Tdap 05/2015 Zostavax 2012. shingrix - discussed covid vaccine - first shot last week (thinks [name]).  Advanced directives: Would want wife to be HCPOA.Has living will at home. [place] with life support if possibility of reversal but doesn't want prolonged life support if terminal condition.Asked to bring [place] a copy.  Seat belt use discussed.  Sunscreen use discussed. No changing moles on skin.Sees dermatologist  [place] Crane).  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 Interim medical history since our last visit reviewed. Allergies and medications reviewed and updated. Outpatient Medications Prior to Visit  Medication Sig Dispense Refill  . aspirin 81 MG tablet Take 81 mg by mouth daily.      . Cholecalciferol (VITAMIN D3) 25 MCG (1000 UT) CAPS Take 1 capsule (1,000 Units total) by mouth daily. 30 capsule   . ibuprofen (ADVIL,MOTRIN) 200 MG tablet Take 200 mg by mouth every 6 (six) hours as needed for pain.    [name] triamcinolone cream (KENALOG) 0.1 % Apply 1 application topically as needed.     . ursodiol (ACTIGALL) 300 MG capsule TAKE 2 CAPSULES BY MOUTH TWICE DAILY 360 capsule 3  . amLODipine (NORVASC) 10 MG tablet TAKE 1 TABLET BY MOUTH ONCE A DAY 90 tablet 1  . atorvastatin (LIPITOR) 20 MG tablet  TAKE 1 TABLET BY MOUTH ONCE A DAY 90 tablet 1  . colchicine 0.6 MG tablet Take 1 tablet (0.6 mg total) by mouth daily as needed (gout). 30 tablet 3  . lisinopril (PRINIVIL,ZESTRIL) 40 MG tablet TAKE 1 TABLET BY MOUTH ONCE A DAY 90 tablet 3  . metoprolol succinate (TOPROL-XL) 25 MG 24 hr tablet TAKE 1 TABLET BY MOUTH ONCE A DAY 90 tablet 3  . omeprazole (PRILOSEC) 20 MG capsule TAKE 1 CAPSULE BY MOUTH ONCE DAILY 90 capsule 1   No facility-administered medications prior to visit.     Per HPI unless specifically indicated in ROS section below Review of Systems  Constitutional: Negative for activity change, appetite change, chills, fatigue, fever and unexpected weight change.  HENT: Negative for hearing loss.    Eyes: Negative for visual disturbance.  Respiratory: Negative for cough, chest tightness, shortness of breath and wheezing.   Cardiovascular: Negative for chest pain, palpitations and leg swelling.  Gastrointestinal: Negative for abdominal distention, abdominal pain, blood in stool, constipation, diarrhea, nausea and vomiting.  Genitourinary: Negative for difficulty urinating and hematuria.  Musculoskeletal: Negative for arthralgias, myalgias and neck pain.  Skin: Negative for rash.  Neurological: Negative for dizziness, seizures, syncope and headaches.  Hematological: Negative for adenopathy. Does not bruise/bleed easily.  Psychiatric/Behavioral: Negative for dysphoric mood. The patient is not nervous/anxious.    Objective:    BP 128/70 (BP Location: Left Arm, Patient Position: Sitting, Cuff Size: Normal)   Pulse (!) 55   Temp 97.9 F (36.6 C) (Temporal)   Ht 5' 9.25" (1.759 m)   Wt 201 lb 2 oz (91.2 kg)   SpO2 97%   BMI 29.49 kg/m   Wt Readings from Last 3 Encounters:  07/21/19 201 lb 2 oz (91.2 kg)  11/12/18 202 lb 6.4 oz (91.8 kg)  07/10/18 203 lb 6 oz (92.3 kg)    Physical Exam Vitals and nursing note reviewed.  Constitutional:      General: He is not in acute distress.    Appearance: Normal appearance. He is well-developed. He is not ill-appearing.  HENT:     Head: Normocephalic and atraumatic.     Right Ear: Hearing, tympanic membrane, ear canal and external ear normal.     Left Ear: Hearing, tympanic membrane, ear canal and external ear normal.     Mouth/Throat:     Pharynx: Uvula midline.  Eyes:     General: No scleral icterus.    Extraocular Movements: Extraocular movements intact.     Conjunctiva/sclera: Conjunctivae normal.     Pupils: Pupils are equal, round, and reactive to light.  Neck:     Thyroid: No thyromegaly or thyroid tenderness.     Vascular: No carotid bruit.  Cardiovascular:     Rate and Rhythm: Normal rate and regular rhythm.     Pulses:  Normal pulses.          Radial pulses are 2+ on the right side and 2+ on the left side.     Heart sounds: Normal heart sounds. No murmur.  Pulmonary:     Effort: Pulmonary effort is normal. No respiratory distress.     Breath sounds: Normal breath sounds. No wheezing, rhonchi or rales.  Abdominal:     General: Abdomen is flat. Bowel sounds are normal. There is no distension.     Palpations: Abdomen is soft. There is no mass.     Tenderness: There is no abdominal tenderness. There is no guarding or rebound.  Hernia: No hernia is present.  Musculoskeletal:        General: Normal range of motion.     Cervical back: Normal range of motion and neck supple.     Right lower leg: No edema.     Left lower leg: No edema.  Lymphadenopathy:     Cervical: No cervical adenopathy.  Skin:    General: Skin is warm and dry.     Findings: No rash.  Neurological:     General: No focal deficit present.     Mental Status: He is alert and oriented to person, place, and time.     Comments:  CN grossly intact, station and gait intact Recall 3/3 Calculation 5/5 DLROW  Psychiatric:        Mood and Affect: Mood normal.        Behavior: Behavior normal.        Thought Content: Thought content normal.        Judgment: Judgment normal.       Results for orders placed or performed in visit on 07/13/19  CBC with Differential/Platelet  Result Value Ref Range   WBC 6.3 4.0 - 10.5 K/uL   RBC 4.55 4.22 - 5.81 Mil/uL   Hemoglobin 14.2 13.0 - 17.0 g/dL   HCT 41.5 39.0 - 52.0 %   MCV 91.2 78.0 - 100.0 fl   MCHC 34.3 30.0 - 36.0 g/dL   RDW 13.3 11.5 - 15.5 %   Platelets 205.0 150.0 - 400.0 K/uL   Neutrophils Relative % 52.9 43.0 - 77.0 %   Lymphocytes Relative 29.4 12.0 - 46.0 %   Monocytes Relative 10.7 3.0 - 12.0 %   Eosinophils Relative 6.3 (H) 0.0 - 5.0 %   Basophils Relative 0.7 0.0 - 3.0 %   Neutro Abs 3.3 1.4 - 7.7 K/uL   Lymphs Abs 1.9 0.7 - 4.0 K/uL   Monocytes Absolute 0.7 0.1 - 1.0 K/uL    Eosinophils Absolute 0.4 0.0 - 0.7 K/uL   Basophils Absolute 0.0 0.0 - 0.1 K/uL  Uric acid  Result Value Ref Range   Uric Acid, Serum 7.5 4.0 - 7.8 mg/dL  VITAMIN D 25 Hydroxy (Vit-D Deficiency, Fractures)  Result Value Ref Range   VITD 30.80 30.00 - 100.00 ng/mL  Comprehensive metabolic panel  Result Value Ref Range   Sodium 141 135 - 145 mEq/L   Potassium 4.3 3.5 - 5.1 mEq/L   Chloride 105 96 - 112 mEq/L   CO2 29 19 - 32 mEq/L   Glucose, Bld 105 (H) 70 - 99 mg/dL   BUN 20 6 - 23 mg/dL   Creatinine, Ser 1.60 (H) 0.40 - 1.50 mg/dL   Total Bilirubin 0.8 0.2 - 1.2 mg/dL   Alkaline Phosphatase 111 39 - 117 U/L   AST 13 0 - 37 U/L   ALT 15 0 - 53 U/L   Total Protein 6.6 6.0 - 8.3 g/dL   Albumin 4.2 3.5 - 5.2 g/dL   GFR 42.45 (L) >60.00 mL/min   Calcium 9.2 8.4 - 10.5 mg/dL  Lipid panel  Result Value Ref Range   Cholesterol 111 0 - 200 mg/dL   Triglycerides 200.0 (H) 0.0 - 149.0 mg/dL   HDL 32.90 (L) >39.00 mg/dL   VLDL 40.0 0.0 - 40.0 mg/dL   LDL Cholesterol 38 0 - 99 mg/dL   Total CHOL/HDL Ratio 3    NonHDL 78.31    Lab Results  Component Value Date   PSA 1.69 06/28/2017  PSA 1.00 06/19/2016   PSA 1.34 06/09/2015   Assessment & Plan:  This visit occurred during the SARS-CoV-2 public health emergency.  Safety protocols were in place, including screening questions prior to the visit, additional usage of staff PPE, and extensive cleaning of exam room while observing appropriate contact time as indicated for disinfecting solutions.   Problem List Items Addressed This Visit    Vitamin D deficiency    Levels remain borderline low - continue daily replacement.       Medicare annual wellness visit, subsequent - Primary    I have personally reviewed the Medicare Annual Wellness questionnaire and have noted 1. The patient's medical and social history 2. Their use of alcohol, tobacco or illicit drugs 3. Their current medications and supplements 4. The patient's functional  ability including ADL's, fall risks, home safety risks and hearing or visual impairment. Cognitive function has been assessed and addressed as indicated.  5. Diet and physical activity 6. Evidence for depression or mood disorders The patients weight, height, BMI have been recorded in the chart. I have made referrals, counseling and provided education to the patient based on review of the above and I have provided the pt with a written personalized care plan for preventive services. Provider list updated.. See scanned questionairre as needed for further documentation. Reviewed preventative protocols and updated unless pt declined.       HLD (hyperlipidemia)    Chronic, LDL at goal on atorvastatin. Trig elevated - reviewed diet choices to improve levels. The ASCVD Risk score Mikey Bussing DC Jr., et al., 2013) failed to calculate for the following reasons:   The valid total cholesterol range is 130 to 320 mg/dL       Relevant Medications   amLODipine (NORVASC) 10 MG tablet   atorvastatin (LIPITOR) 20 MG tablet   lisinopril (ZESTRIL) 40 MG tablet   metoprolol succinate (TOPROL-XL) 25 MG 24 hr tablet   Health maintenance examination    Preventative protocols reviewed and updated unless pt declined. Discussed healthy diet and lifestyle.       Gout    Managed with PRN colchicine - with several flares per year. Rash to allopurinol in the past.  Check urate when he returns in 3 months for lab visit, if remaining elevated, consider other treatment.       Relevant Orders   Uric acid   Gastroesophageal reflux disease without esophagitis    Continues omeprazole 20mg  daily.       Relevant Medications   omeprazole (PRILOSEC) 20 MG capsule   Essential hypertension    Chronic, stable. Continue current regimen.       Relevant Medications   amLODipine (NORVASC) 10 MG tablet   atorvastatin (LIPITOR) 20 MG tablet   lisinopril (ZESTRIL) 40 MG tablet   metoprolol succinate (TOPROL-XL) 25 MG 24 hr  tablet   Coronary atherosclerosis    Continue statin, aspirin.       Relevant Medications   amLODipine (NORVASC) 10 MG tablet   atorvastatin (LIPITOR) 20 MG tablet   lisinopril (ZESTRIL) 40 MG tablet   metoprolol succinate (TOPROL-XL) 25 MG 24 hr tablet   Common bile duct (CBD) obstruction    Continues ursodiol. Overdue for GI f/u - will refer for colonoscopy.       Chronic kidney disease, stage 3b    Reviewed latest Cr with patient - as well as importance of fluid intake and avoiding NSAIDs.       Relevant Orders   Renal function panel  Advanced care planning/counseling discussion    Advanced directives: Would want wife to be HCPOA.Has living will at home. [place] with life support if possibility of reversal but doesn't want prolonged life support if terminal condition.Asked to bring [place] a copy.        Other Visit Diagnoses    Special screening for malignant neoplasms, colon       Relevant Orders   Ambulatory referral to Gastroenterology   Special screening for malignant neoplasm of prostate       Relevant Orders   PSA, Medicare       Meds ordered this encounter  Medications  . amLODipine (NORVASC) 10 MG tablet    Sig: Take 1 tablet (10 mg total) by mouth daily.    Dispense:  90 tablet    Refill:  3  . atorvastatin (LIPITOR) 20 MG tablet    Sig: Take 1 tablet (20 mg total) by mouth daily.    Dispense:  90 tablet    Refill:  3  . colchicine 0.6 MG tablet    Sig: Take 1 tablet (0.6 mg total) by mouth daily as needed (gout).    Dispense:  30 tablet    Refill:  3  . lisinopril (ZESTRIL) 40 MG tablet    Sig: Take 1 tablet (40 mg total) by mouth daily.    Dispense:  90 tablet    Refill:  3  . metoprolol succinate (TOPROL-XL) 25 MG 24 hr tablet    Sig: Take 1 tablet (25 mg total) by mouth daily.    Dispense:  90 tablet    Refill:  3  . omeprazole (PRILOSEC) 20 MG capsule    Sig: Take 1 capsule (20 mg total) by mouth daily.    Dispense:  90 capsule    Refill:  3    Orders Placed This Encounter  Procedures  . PSA, Medicare    Standing Status:   Future    Standing Expiration Date:   07/20/2020  . Renal function panel    Standing Status:   Future    Standing Expiration Date:   07/20/2020  . Uric acid    Standing Status:   Future    Standing Expiration Date:   07/20/2020  . Ambulatory referral to Gastroenterology    Referral Priority:   Routine    Referral Type:   Consultation    Referral Reason:   Specialty Services Required    Number of Visits Requested:   1    Patient instructions: If interested, check with pharmacy about new 2 shot shingles series (shingrix). Wait 1 month after last covid shot if you're interested.  Kidney function remains impaired - increase water to ensure kidneys stay well hydrated.  Bring me copy of your living will to update your chart.  You are doing well today. Return in 3 months for lab visit only.  Return as needed or in 1 year for next physical.   Follow up plan: Return in about 1 year (around 07/20/2020) for annual exam, prior fasting for blood work, medicare wellness visit.  Ria Bush, MD

## 2019-07-21 NOTE — Patient Instructions (Addendum)
If interested, check with pharmacy about new 2 shot shingles series (shingrix). Wait 1 month after last covid shot if you're interested.  Kidney function remains impaired - increase water to ensure kidneys stay well hydrated.  Bring me copy of your living will to update your chart.  You are doing well today. Return in 3 months for lab visit only.  Return as needed or in 1 year for next physical.   Health Maintenance After Age 74 After age 29, you are at a higher risk for certain long-term diseases and infections as well as injuries from falls. Falls are a major cause of broken bones and head injuries in people who are older than age 32. Getting regular preventive care can help to keep you healthy and well. Preventive care includes getting regular testing and making lifestyle changes as recommended by your health care provider. Talk with your health care provider about:  Which screenings and tests you should have. A screening is a test that checks for a disease when you have no symptoms.  A diet and exercise plan that is right for you. What should I know about screenings and tests to prevent falls? Screening and testing are the best ways to find a health problem early. Early diagnosis and treatment give you the best chance of managing medical conditions that are common after age 48. Certain conditions and lifestyle choices may make you more likely to have a fall. Your health care provider may recommend:  Regular vision checks. Poor vision and conditions such as cataracts can make you more likely to have a fall. If you wear glasses, make sure to get your prescription updated if your vision changes.  Medicine review. Work with your health care provider to regularly review all of the medicines you are taking, including over-the-counter medicines. Ask your health care provider about any side effects that may make you more likely to have a fall. Tell your health care provider if any medicines that you  take make you feel dizzy or sleepy.  Osteoporosis screening. Osteoporosis is a condition that causes the bones to get weaker. This can make the bones weak and cause them to break more easily.  Blood pressure screening. Blood pressure changes and medicines to control blood pressure can make you feel dizzy.  Strength and balance checks. Your health care provider may recommend certain tests to check your strength and balance while standing, walking, or changing positions.  Foot health exam. Foot pain and numbness, as well as not wearing proper footwear, can make you more likely to have a fall.  Depression screening. You may be more likely to have a fall if you have a fear of falling, feel emotionally low, or feel unable to do activities that you used to do.  Alcohol use screening. Using too much alcohol can affect your balance and may make you more likely to have a fall. What actions can I take to lower my risk of falls? General instructions  Talk with your health care provider about your risks for falling. Tell your health care provider if: ? You fall. Be sure to tell your health care provider about all falls, even ones that seem minor. ? You feel dizzy, sleepy, or off-balance.  Take over-the-counter and prescription medicines only as told by your health care provider. These include any supplements.  Eat a healthy diet and maintain a healthy weight. A healthy diet includes low-fat dairy products, low-fat (lean) meats, and fiber from whole grains, beans, and lots of  fruits and vegetables. Home safety  Remove any tripping hazards, such as rugs, cords, and clutter.  Install safety equipment such as grab bars in bathrooms and safety rails on stairs.  Keep rooms and walkways well-lit. Activity   Follow a regular exercise program to stay fit. This will help you maintain your balance. Ask your health care provider what types of exercise are appropriate for you.  If you need a cane or  walker, use it as recommended by your health care provider.  Wear supportive shoes that have nonskid soles. Lifestyle  Do not drink alcohol if your health care provider tells you not to drink.  If you drink alcohol, limit how much you have: ? 0-1 drink a day for women. ? 0-2 drinks a day for men.  Be aware of how much alcohol is in your drink. In the [place] one drink equals one typical bottle of beer (12 oz), one-half glass of wine (5 oz), or one shot of hard liquor (1 oz).  Do not use any products that contain nicotine or tobacco, such as cigarettes and e-cigarettes. If you need help quitting, ask your health care provider. Summary  Having a healthy lifestyle and getting preventive care can help to protect your health and wellness after age 46.  Screening and testing are the best way to find a health problem early and help you avoid having a fall. Early diagnosis and treatment give you the best chance for managing medical conditions that are more common for people who are older than age 90.  Falls are a major cause of broken bones and head injuries in people who are older than age 75. Take precautions to prevent a fall at home.  Work with your health care provider to learn what changes you can make to improve your health and wellness and to prevent falls. This information is not intended to replace advice given to you by your health care provider. Make sure you discuss any questions you have with your health care provider. Document Revised: 09/04/2018 Document Reviewed: 03/27/2017 Elsevier Patient Education  2020 [employer].

## 2019-07-21 NOTE — Assessment & Plan Note (Signed)
Advanced directives:  Would want wife to be HCPOA. Has living will at home. [place] with life support if possibility of reversal but doesn't want prolonged life support if terminal condition. Asked to bring us a copy.  

## 2019-07-21 NOTE — Assessment & Plan Note (Signed)
Levels remain borderline low - continue daily replacement.

## 2019-07-21 NOTE — Assessment & Plan Note (Signed)
Continues omeprazole 53m daily.

## 2019-07-21 NOTE — Assessment & Plan Note (Signed)

## 2019-07-21 NOTE — Assessment & Plan Note (Signed)
Continue statin, aspirin 

## 2019-08-07 ENCOUNTER — Other Ambulatory Visit: Payer: Self-pay

## 2019-08-07 ENCOUNTER — Other Ambulatory Visit: Payer: Self-pay | Admitting: Internal Medicine

## 2019-08-07 MED ORDER — URSODIOL 300 MG PO CAPS: 600.0000 mg | ORAL_CAPSULE | Freq: Two times a day (BID) | ORAL | 0 refills | Status: DC

## 2019-09-14 ENCOUNTER — Encounter: Payer: Self-pay | Admitting: Internal Medicine

## 2019-09-23 ENCOUNTER — Telehealth: Payer: Self-pay | Admitting: Family Medicine

## 2019-09-23 MED ORDER — COLCHICINE 0.6 MG PO CAPS: 1.0000 | ORAL_CAPSULE | Freq: Every day | ORAL | 3 refills | Status: DC | PRN

## 2019-09-23 NOTE — Telephone Encounter (Signed)
Actually reviewing website provided, mitigare may be covered better so I have sent this in. Have him price out. It's a brand of colchicine.

## 2019-09-23 NOTE — Telephone Encounter (Signed)
plz notify we received notice from pharmacy that they would no longer cover colchicine.  plz have pt call insurance to see if mitigare or colcrys are better covered alternatives.

## 2019-09-23 NOTE — Addendum Note (Signed)
Addended by: Ria Bush on: 09/23/2019 08:34 AM   Modules accepted: Orders

## 2019-09-23 NOTE — Telephone Encounter (Signed)
Spoke with pt relaying Dr. Synthia Innocent message.  Pt verbalizes understanding and will let Dr. Darnell Level know about the price.

## 2019-10-20 ENCOUNTER — Other Ambulatory Visit (INDEPENDENT_AMBULATORY_CARE_PROVIDER_SITE_OTHER): Payer: Medicare PPO

## 2019-10-20 ENCOUNTER — Other Ambulatory Visit: Payer: Self-pay

## 2019-10-20 ENCOUNTER — Other Ambulatory Visit: Payer: Medicare PPO

## 2019-10-20 DIAGNOSIS — Z125 Encounter for screening for malignant neoplasm of prostate: Secondary | ICD-10-CM | POA: Diagnosis not present

## 2019-10-20 DIAGNOSIS — N1832 Chronic kidney disease, stage 3b: Secondary | ICD-10-CM | POA: Diagnosis not present

## 2019-10-20 DIAGNOSIS — M1A9XX0 Chronic gout, unspecified, without tophus (tophi): Secondary | ICD-10-CM

## 2019-10-20 LAB — RENAL FUNCTION PANEL
Albumin: 4.1 g/dL (ref 3.5–5.2)
BUN: 21 mg/dL (ref 6–23)
CO2: 29 mEq/L (ref 19–32)
Calcium: 9.1 mg/dL (ref 8.4–10.5)
Chloride: 105 mEq/L (ref 96–112)
Creatinine, Ser: 1.43 mg/dL (ref 0.40–1.50)
GFR: 48.29 mL/min — ABNORMAL LOW (ref >60.00)
Glucose, Bld: 106 mg/dL — ABNORMAL HIGH (ref 70–99)
Phosphorus: 3.6 mg/dL (ref 2.3–4.6)
Potassium: 4.4 mEq/L (ref 3.5–5.1)
Sodium: 139 mEq/L (ref 135–145)

## 2019-10-20 LAB — URIC ACID: Uric Acid, Serum: 8.4 mg/dL — ABNORMAL HIGH (ref 4.0–7.8)

## 2019-10-20 LAB — PSA, MEDICARE: PSA: 0.67 ng/ml (ref 0.10–4.00)

## 2019-10-24 ENCOUNTER — Other Ambulatory Visit: Payer: Self-pay | Admitting: Family Medicine

## 2019-10-24 MED ORDER — PROBENECID 500 MG PO TABS: 250.0000 mg | ORAL_TABLET | Freq: Two times a day (BID) | ORAL | 11 refills | Status: DC

## 2019-10-29 ENCOUNTER — Encounter: Payer: Medicare PPO | Admitting: Internal Medicine

## 2019-11-05 ENCOUNTER — Other Ambulatory Visit: Payer: Self-pay | Admitting: Internal Medicine

## 2019-11-06 ENCOUNTER — Telehealth: Payer: Self-pay | Admitting: Internal Medicine

## 2019-11-09 ENCOUNTER — Other Ambulatory Visit: Payer: Self-pay

## 2019-11-09 MED ORDER — URSODIOL 300 MG PO CAPS: 600.0000 mg | ORAL_CAPSULE | Freq: Two times a day (BID) | ORAL | 0 refills | Status: DC

## 2019-11-09 NOTE — Telephone Encounter (Signed)
Ursodiol refilled.

## 2019-11-10 ENCOUNTER — Telehealth: Payer: Self-pay | Admitting: Family Medicine

## 2019-11-10 MED ORDER — PROBENECID 500 MG PO TABS: 250.0000 mg | ORAL_TABLET | Freq: Two times a day (BID) | ORAL | 11 refills | Status: DC

## 2019-11-10 NOTE — Addendum Note (Signed)
Addended by: Ria Bush on: 11/10/2019 05:43 PM   Modules accepted: Orders

## 2019-11-10 NOTE — Telephone Encounter (Signed)
Patient called today in regards to a script that was going to be sent over to his pharmacy He stated it was for the probenecid (BENEMID) 500 MG tablet   Patient spoke with [facility] and they have not received a prescription for this medication and advised him to call our office to have it resent to them

## 2019-11-10 NOTE — Telephone Encounter (Signed)
I've refilled. plz call tomorrow to ensure it was received.

## 2019-11-10 NOTE — Telephone Encounter (Signed)
Looks like a refill was sent 10/24/19.  Spoke with Dillion of [employer] asking if refill was received and denies it was.  Ok to call in refill?

## 2019-11-11 NOTE — Telephone Encounter (Addendum)
Spoke with pharmacy asking about refill.  Confirms they did receive it.   Notified pt, by phn, a new refill was sent and received.

## 2019-11-26 HISTORY — PX: COLONOSCOPY: SHX174

## 2019-12-08 ENCOUNTER — Ambulatory Visit (AMBULATORY_SURGERY_CENTER): Payer: Self-pay

## 2019-12-08 ENCOUNTER — Other Ambulatory Visit: Payer: Self-pay

## 2019-12-08 VITALS — Ht 69.25 in | Wt 203.0 lb

## 2019-12-08 DIAGNOSIS — Z86010 Personal history of colonic polyps: Secondary | ICD-10-CM

## 2019-12-08 MED ORDER — NA SULFATE-K SULFATE-MG SULF 17.5-3.13-1.6 GM/177ML PO SOLN
1.0000 | Freq: Once | ORAL | 0 refills | Status: AC
Start: 2019-12-08 — End: 2019-12-08

## 2019-12-08 NOTE — Progress Notes (Signed)
No egg or soy allergy known to patient  No issues with past sedation with any surgeries or procedures no intubation problems in the past  No diet pills per patient No home 02 use per patient  No blood thinners per patient  Pt denies issues with constipation  No A fib or A flutter  EMMI video to pt or MyChart  COVID 19 guidelines implemented in [facility] today   COVID vaccines completed on 07/2019 per pt   Due to the COVID-19 pandemic we are asking patients to follow these guidelines. Please only bring one care partner. Please be aware that your care partner may wait in the car in the parking lot or if they feel like they will be too hot to wait in the car, they may wait in the lobby on the 4th floor. All care partners are required to wear a mask the entire time (we do not have any that we can provide them), they need to practice social distancing, and we will do a Covid check for all patient's and care partners when you arrive. Also we will check their temperature and your temperature. If the care partner waits in their car they need to stay in the parking lot the entire time and we will call them on their cell phone when the patient is ready for discharge so they can bring the car to the front of the building. Also all patient's will need to wear a mask into building.

## 2019-12-09 ENCOUNTER — Encounter: Payer: Self-pay | Admitting: Internal Medicine

## 2019-12-10 ENCOUNTER — Encounter: Payer: Self-pay | Admitting: Cardiovascular Disease

## 2019-12-10 ENCOUNTER — Other Ambulatory Visit: Payer: Self-pay | Admitting: Internal Medicine

## 2019-12-10 ENCOUNTER — Ambulatory Visit: Payer: Medicare PPO | Admitting: Cardiovascular Disease

## 2019-12-10 ENCOUNTER — Other Ambulatory Visit: Payer: Self-pay

## 2019-12-10 VITALS — BP 118/66 | HR 54 | Ht 69.25 in | Wt 200.6 lb

## 2019-12-10 DIAGNOSIS — I10 Essential (primary) hypertension: Secondary | ICD-10-CM | POA: Diagnosis not present

## 2019-12-10 DIAGNOSIS — I2510 Atherosclerotic heart disease of native coronary artery without angina pectoris: Secondary | ICD-10-CM | POA: Diagnosis not present

## 2019-12-10 DIAGNOSIS — E782 Mixed hyperlipidemia: Secondary | ICD-10-CM

## 2019-12-10 NOTE — Progress Notes (Signed)
Cardiology Office Note:    Date:  12/10/2019   ID:  Christian Kim, DOB 06/16/1945, MRN [id number]  PCP:  Ria Bush, MD  [facility] Cardiologist:  Sherren Mocha, MD  [place] Electrophysiologist:  None   Referring MD: Ria Bush, MD   Chief Complaint  Patient presents with  . Coronary Artery Disease    History of Present Illness:    Christian Kim is a 74 y.o. male with a hx of coronary artery disease, presenting for follow-up evaluation.  He initially presented in 2008 with a non-ST elevation infarct. At that time he underwent complex PCIofthe left circumflex and a long area of total occlusion was treated with overlapping drug-eluting stents. He then underwent staged PCI of the right coronary artery during that same hospital admission. He was noted to have moderate LAD stenosis and this is been managed medically.  The patient's last ischemic evaluation he had in 2018 involved a stress echocardiogram which demonstrated normal findings with no ischemic changes.  The patient is here alone today. 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 Today, he denies symptoms of palpitations, chest pain, shortness of breath, orthopnea, PND, lower extremity edema, dizziness, or syncope.   Past Medical History:  Diagnosis Date  . CAD (coronary artery disease) 2008   stent  . CAP (community acquired pneumonia) 03/07/2015  . Choledocholithiasis    recurrent/multiple ERCPs with stone extraction and sphincterotomy   . CHOLEDOCHOLITHIASIS 09/20/2007   Qualifier: Diagnosis of  By: Roxan Hockey, [place]    . GERD (gastroesophageal reflux disease)   . Gout 2013   dx by crystal analysis  . Hyperlipidemia   . Hypertension   . Myocardial infarction ([place]) 12-2006  . Prostatitis 01/22/2017    Past Surgical History:  Procedure Laterality Date  . CHOLECYSTECTOMY  2006   w/ revision 2/2 persistent gallstones  . COLONOSCOPY  06/2013   1 polyp, rpt 5 yrs  [name])  . CORONARY ANGIOPLASTY WITH STENT PLACEMENT  2008   stent, Dr. Hyman Hopes  . NASAL SEPTUM SURGERY      Current Medications: Current Meds  Medication Sig  . amLODipine (NORVASC) 10 MG tablet Take 1 tablet (10 mg total) by mouth daily.  [name] aspirin 81 MG tablet Take 81 mg by mouth daily.    [name] atorvastatin (LIPITOR) 20 MG tablet Take 1 tablet (20 mg total) by mouth daily.  . Cholecalciferol (VITAMIN D3) 25 MCG (1000 UT) CAPS Take 1 capsule (1,000 Units total) by mouth daily.  . Colchicine (MITIGARE) 0.6 MG CAPS Take 1 capsule by mouth daily as needed (gout flare). Take 2 capsules on the first day of flare  . ibuprofen (ADVIL,MOTRIN) 200 MG tablet Take 200 mg by mouth every 6 (six) hours as needed for pain.  [name] lisinopril (ZESTRIL) 40 MG tablet Take 1 tablet (40 mg total) by mouth daily.  . metoprolol succinate (TOPROL-XL) 25 MG 24 hr tablet Take 1 tablet (25 mg total) by mouth daily.  [name] omeprazole (PRILOSEC) 20 MG capsule Take 1 capsule (20 mg total) by mouth daily.  . probenecid (BENEMID) 500 MG tablet Take 0.5 tablets (250 mg total) by mouth 2 (two) times daily.  [name] triamcinolone cream (KENALOG) 0.1 % Apply 1 application topically as needed.   . ursodiol (ACTIGALL) 300 MG capsule Take 2 capsules (600 mg total) by mouth 2 (two) times daily.     Allergies:   Allopurinol   Social History   Socioeconomic History  .  Marital status: Married    Spouse name: Not on file  . Number of children: 2  . Years of education: Not on file  . Highest education level: Not on file  Occupational History  . Occupation: retired Academic librarian  . 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 Smoking status: Never Smoker  . Smokeless tobacco: Never Used  Vaping Use  . Vaping Use: Never used  Substance and Sexual Activity  . Alcohol use: Yes    Comment: rarely drinks - 1 to 2 beers per month  . Drug use: No  . Sexual activity: Yes   Other Topics Concern  . 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   Activity: works farm to stay active, rabbit hunting   Diet: good water, fruits/vegetables daily   Social Determinants of Radio broadcast assistant Strain:   . Difficulty of Paying Living Expenses:   Food Insecurity:   . Worried About Charity fundraiser in the Last Year:   . Arboriculturist in the Last Year:   Transportation Needs:   . Film/video editor (Medical):   [name] Lack of Transportation (Non-Medical):   Physical Activity:   . Days of Exercise per Week:   . Minutes of Exercise per Session:   Stress:   . Feeling of Stress :   Social Connections:   . Frequency of Communication with Friends and Family:   . Frequency of Social Gatherings with Friends and Family:   . Attends Religious Services:   . Active Member of Clubs or Organizations:   . Attends Archivist Meetings:   [name] Marital Status:      Family History: The patient's family history includes Liver cancer in his mother; Lung cancer in his mother. There is no history of Colon cancer, Esophageal cancer, Stomach cancer, or Rectal cancer.  ROS:   Please see the history of present illness.    All other systems reviewed and are negative.  EKGs/Labs/Other Studies Reviewed:    The following studies were reviewed today: Stress echo 10/17/2016: Impressions:   - Negative stress echo.  NO evidence of ischemia .   Stress results:  Maximal heart rate during stress was 134 bpm (90%  of maximal predicted heart rate). The maximal predicted heart rate  was 149 bpm.The target heart rate was achieved. There was a normal  resting blood pressure with a hypertensive response to stress. The  rate-pressure product for the peak heart rate and blood pressure  was 24955 mm Hg/min.    -------------------------------------------------------------------  Stress ECG: There were no ST or T wave changes to suggest ischemia   EKG:  EKG is ordered today.  The ekg ordered today demonstrates sinus bradycardia 54 bpm, within normal limits  Recent Labs: 07/13/2019: ALT 15; Hemoglobin 14.2; Platelets 205.0 10/20/2019: BUN 21; Creatinine, Ser 1.43; Potassium 4.4; Sodium 139  Recent Lipid Panel    Component Value Date/Time   CHOL 111 07/13/2019 0846   TRIG 200.0 (H) 07/13/2019 0846   HDL 32.90 (L) 07/13/2019 0846   CHOLHDL 3 07/13/2019 0846   VLDL 40.0 07/13/2019 0846   LDLCALC 38 07/13/2019 0846   LDLDIRECT 57.0 07/08/2018 0953    Physical Exam:    VS:  BP  118/66   Pulse (!) 54   Ht 5' 9.25" (1.759 m)   Wt 200 lb 9.6 oz (91 kg)   SpO2 94%   BMI 29.41 kg/m     Wt Readings from Last 3 Encounters:  12/10/19 200 lb 9.6 oz (91 kg)  12/08/19 203 lb (92.1 kg)  07/21/19 201 lb 2 oz (91.2 kg)     GEN:  Well nourished, well developed in no acute distress HEENT: Normal NECK: No JVD; No carotid bruits LYMPHATICS: No lymphadenopathy CARDIAC: bradycardic and regular, no murmurs, rubs, gallops RESPIRATORY:  Clear to auscultation without rales, wheezing or rhonchi  ABDOMEN: Soft, non-tender, non-distended MUSCULOSKELETAL:  No edema; No deformity  SKIN: Warm and dry NEUROLOGIC:  Alert and oriented x 3 PSYCHIATRIC:  Normal affect   ASSESSMENT:    1. Coronary artery disease involving native coronary artery of native heart without angina pectoris   2. Essential hypertension   3. Mixed hyperlipidemia    PLAN:    In order of problems listed above:  1. Doing well without angina. We discussed his anginal equivalent symptoms back at the time of his MI - throbbing right shoulder and upper arm pain. He has had nothing like this since the time of his MI. His medical program is reviewed and will be continued (ASA, metoprolol, statin, ACE-I). 2. BP well-controlled on  amlodipine, lisinopril, and metoprolol succinate.  3. Lipids at goal with LDL 38 mg/dL on atorvastatin. We discussed reduction in red meat and other dietary modification.    Medication Adjustments/Labs and Tests Ordered: Current medicines are reviewed at length with the patient today.  Concerns regarding medicines are outlined above.  Orders Placed This Encounter  Procedures  . EKG 12-Lead   No orders of the defined types were placed in this encounter.   Patient Instructions  Medication Instructions:  Your provider recommends that you continue on your current medications as directed. Please refer to the Current Medication list given to you today.  *If you need a refill on your cardiac medications before your next appointment, please call your pharmacy*   Follow-Up: At [facility], you and your health needs are our priority.  As part of our continuing mission to provide you with exceptional heart care, we have created designated Provider Care Teams.  These Care Teams include your primary Cardiologist (physician) and Advanced Practice Providers (APPs -  Physician Assistants and Nurse Practitioners) who all work together to provide you with the care you need, when you need it. Your next appointment:   12 month(s) The format for your next appointment:   In Person Provider:   You may see Sherren Mocha, MD or one of the following Advanced Practice Providers on your designated Care Team:    [name], PA-C  Robbie Lis, [place]      Signed, Sherren Mocha, MD  12/10/2019 12:36 PM    [place]

## 2019-12-10 NOTE — Patient Instructions (Signed)

## 2019-12-22 ENCOUNTER — Encounter: Payer: Self-pay | Admitting: Internal Medicine

## 2019-12-22 ENCOUNTER — Ambulatory Visit (AMBULATORY_SURGERY_CENTER): Payer: Medicare PPO | Admitting: Internal Medicine

## 2019-12-22 ENCOUNTER — Other Ambulatory Visit: Payer: Self-pay

## 2019-12-22 VITALS — BP 138/70 | HR 47 | Temp 96.9°F | Resp 17 | Ht 69.25 in | Wt 203.0 lb

## 2019-12-22 DIAGNOSIS — D123 Benign neoplasm of transverse colon: Secondary | ICD-10-CM | POA: Diagnosis not present

## 2019-12-22 DIAGNOSIS — E669 Obesity, unspecified: Secondary | ICD-10-CM | POA: Diagnosis not present

## 2019-12-22 DIAGNOSIS — I2510 Atherosclerotic heart disease of native coronary artery without angina pectoris: Secondary | ICD-10-CM | POA: Diagnosis not present

## 2019-12-22 DIAGNOSIS — Z86010 Personal history of colonic polyps: Secondary | ICD-10-CM

## 2019-12-22 DIAGNOSIS — K219 Gastro-esophageal reflux disease without esophagitis: Secondary | ICD-10-CM | POA: Diagnosis not present

## 2019-12-22 DIAGNOSIS — I10 Essential (primary) hypertension: Secondary | ICD-10-CM | POA: Diagnosis not present

## 2019-12-22 MED ORDER — SODIUM CHLORIDE 0.9 % IV SOLN: 500.0000 mL | Freq: Once | INTRAVENOUS | Status: DC

## 2019-12-22 NOTE — Patient Instructions (Signed)
Handouts Provided:  Polyps  YOU HAD AN ENDOSCOPIC PROCEDURE TODAY AT THE [facility]:   Refer to the procedure report that was given to you for any specific questions about what was found during the examination.  If the procedure report does not answer your questions, please call your gastroenterologist to clarify.  If you requested that your care partner not be given the details of your procedure findings, then the procedure report has been included in a sealed envelope for you to review at your convenience later.  YOU SHOULD EXPECT: Some feelings of bloating in the abdomen. Passage of more gas than usual.  Walking can help get rid of the air that was put into your GI tract during the procedure and reduce the bloating. If you had a lower endoscopy (such as a colonoscopy or flexible sigmoidoscopy) you may notice spotting of blood in your stool or on the toilet paper. If you underwent a bowel prep for your procedure, you may not have a normal bowel movement for a few days.  Please Note:  You might notice some irritation and congestion in your nose or some drainage.  This is from the oxygen used during your procedure.  There is no need for concern and it should clear up in a day or so.  SYMPTOMS TO REPORT IMMEDIATELY:   Following lower endoscopy (colonoscopy or flexible sigmoidoscopy):  Excessive amounts of blood in the stool  Significant tenderness or worsening of abdominal pains  Swelling of the abdomen that is new, acute  Fever of 100F or higher  For urgent or emergent issues, a gastroenterologist can be reached at any hour by calling [phone]. Do not use MyChart messaging for urgent concerns.    DIET:  We do recommend a small meal at first, but then you may proceed to your regular diet.  Drink plenty of fluids but you should avoid alcoholic beverages for 24 hours.  ACTIVITY:  You should plan to take it easy for the rest of today and you should NOT DRIVE or use heavy  machinery until tomorrow (because of the sedation medicines used during the test).    FOLLOW UP: Our staff will call the number listed on your records 48-72 hours following your procedure to check on you and address any questions or concerns that you may have regarding the information given to you following your procedure. If we do not reach you, we will leave a message.  We will attempt to reach you two times.  During this call, we will ask if you have developed any symptoms of COVID 19. If you develop any symptoms (ie: fever, flu-like symptoms, shortness of breath, cough etc.) before then, please call [phone].  If you test positive for Covid 19 in the 2 weeks post procedure, please call and report this information to us.    If any biopsies were taken you will be contacted by phone or by letter within the next 1-3 weeks.  Please call us at [phone] if you have not heard about the biopsies in 3 weeks.    SIGNATURES/CONFIDENTIALITY: You and/or your care partner have signed paperwork which will be entered into your electronic medical record.  These signatures attest to the fact that that the information above on your After Visit Summary has been reviewed and is understood.  Full responsibility of the confidentiality of this discharge information lies with you and/or your care-partner.  

## 2019-12-22 NOTE — Op Note (Signed)
[facility] Patient Name: Christian Kim Procedure Date: 12/22/2019 2:39 PM MRN: [id number] Endoscopist: Docia Chuck. [name] , MD Age: 74 Referring MD:  Date of Birth: 1946/05/03 Gender: Male Account #: [id number] Procedure:                Colonoscopy with cold snare polypectomy x 1 Indications:              High risk colon cancer surveillance: Personal                            history of non-advanced adenoma. Previous                            examinations 2003 (negative for neoplasia); 2015                            (diminutive adenoma) Medicines:                Monitored Anesthesia Care Procedure:                Pre-Anesthesia Assessment:                           - Prior to the procedure, a History and Physical                            was performed, and patient medications and                            allergies were reviewed. The patient's tolerance of                            previous anesthesia was also reviewed. The risks                            and benefits of the procedure and the sedation                            options and risks were discussed with the patient.                            All questions were answered, and informed consent                            was obtained. Prior Anticoagulants: The patient has                            taken no previous anticoagulant or antiplatelet                            agents. ASA Grade Assessment: II - A patient with                            mild systemic disease. After reviewing the risks  and benefits, the patient was deemed in                            satisfactory condition to undergo the procedure.                           After obtaining informed consent, the colonoscope                            was passed under direct vision. Throughout the                            procedure, the patient's blood pressure, pulse, and                            oxygen saturations were  monitored continuously. The                            Colonoscope was introduced through the anus and                            advanced to the the cecum, identified by                            appendiceal orifice and ileocecal valve. The                            ileocecal valve, appendiceal orifice, and rectum                            were photographed. The quality of the bowel                            preparation was excellent. The colonoscopy was                            performed without difficulty. The patient tolerated                            the procedure well. The bowel preparation used was                            SUPREP via split dose instruction. Scope In: 2:48:42 PM Scope Out: 2:59:43 PM Scope Withdrawal Time: 0 hours 8 minutes 57 seconds  Total Procedure Duration: 0 hours 11 minutes 1 second  Findings:                 A 2 mm polyp was found in the transverse colon. The                            polyp was removed with a cold snare. Resection and                            retrieval were complete.  Internal hemorrhoids were found during retroflexion.                           The exam was otherwise without abnormality on                            direct and retroflexion views. Complications:            No immediate complications. Estimated blood loss:                            None. Estimated Blood Loss:     Estimated blood loss: none. Impression:               - One 2 mm polyp in the transverse colon, removed                            with a cold snare. Resected and retrieved.                           - Internal hemorrhoids.                           - The examination was otherwise normal on direct                            and retroflexion views. Recommendation:           - Repeat colonoscopy is not recommended for                            surveillance.                           - Patient has a contact number available for                             emergencies. The signs and symptoms of potential                            delayed complications were discussed with the                            patient. Return to normal activities tomorrow.                            Written discharge instructions were provided to the                            patient.                           - Resume previous diet.                           - Continue present medications.                           -  Await pathology results. Docia Chuck. [name], MD 12/22/2019 3:05:44 PM This report has been signed electronically.

## 2019-12-22 NOTE — Progress Notes (Signed)
Pt's states no medical or surgical changes since previsit or office visit.   V/S-CW  Check-in-JB 

## 2019-12-22 NOTE — Progress Notes (Signed)
Called to room to assist during endoscopic procedure.  Patient ID and intended procedure confirmed with present staff. Received instructions for my participation in the procedure from the performing physician.  

## 2019-12-22 NOTE — Progress Notes (Signed)
Report to PACU, RN, vss, BBS= Clear.  

## 2019-12-24 ENCOUNTER — Telehealth: Payer: Self-pay

## 2019-12-24 NOTE — Telephone Encounter (Signed)
  Follow up Call-  Call back number 12/22/2019  Post procedure Call Back phone  # [phone]  Permission to leave phone message Yes  Some recent data might be hidden     Patient questions:  Do you have a fever, pain , or abdominal swelling? No. Pain Score  0 *  Have you tolerated food without any problems? Yes.    Have you been able to return to your normal activities? Yes.    Do you have any questions about your discharge instructions: Diet   No. Medications  No. Follow up visit  No.  Do you have questions or concerns about your Care? No.  Actions: * If pain score is 4 or above: No action needed, pain <4.   Have you developed a fever since your procedure? No   2.   Have you had an respiratory symptoms (SOB or cough) since your procedure? No   3.   Have you tested positive for COVID 19 since your procedure No   4.   Have you had any family members/close contacts diagnosed with the COVID 19 since your procedure?  No    If yes to any of these questions please route to Joylene John, RN and Erenest Rasher, RN

## 2019-12-25 ENCOUNTER — Telehealth: Payer: Self-pay | Admitting: Internal Medicine

## 2019-12-25 MED ORDER — URSODIOL 300 MG PO CAPS: 600.0000 mg | ORAL_CAPSULE | Freq: Two times a day (BID) | ORAL | 1 refills | Status: DC

## 2019-12-25 NOTE — Telephone Encounter (Signed)
Pt called to inform that he spoke with Dr. [name] the day of his procedure on 7/27 and Dr. [name] told him that he did not need an ov to rf his medication and he could cancel his upcoming appt on 8/4. Pt needs rf for Ursudiol sent to [facility].

## 2019-12-25 NOTE — Telephone Encounter (Signed)
Refilled Actigall

## 2019-12-29 ENCOUNTER — Encounter: Payer: Self-pay | Admitting: Internal Medicine

## 2019-12-30 ENCOUNTER — Ambulatory Visit: Payer: Medicare PPO | Admitting: Internal Medicine

## 2020-06-22 ENCOUNTER — Other Ambulatory Visit: Payer: Self-pay

## 2020-06-22 MED ORDER — URSODIOL 300 MG PO CAPS: 600.0000 mg | ORAL_CAPSULE | Freq: Two times a day (BID) | ORAL | 1 refills | Status: DC

## 2020-07-14 ENCOUNTER — Other Ambulatory Visit: Payer: Self-pay | Admitting: Family Medicine

## 2020-07-14 DIAGNOSIS — E559 Vitamin D deficiency, unspecified: Secondary | ICD-10-CM

## 2020-07-14 DIAGNOSIS — E785 Hyperlipidemia, unspecified: Secondary | ICD-10-CM

## 2020-07-14 DIAGNOSIS — M1A9XX0 Chronic gout, unspecified, without tophus (tophi): Secondary | ICD-10-CM

## 2020-07-14 DIAGNOSIS — N1832 Chronic kidney disease, stage 3b: Secondary | ICD-10-CM

## 2020-07-15 ENCOUNTER — Other Ambulatory Visit (INDEPENDENT_AMBULATORY_CARE_PROVIDER_SITE_OTHER): Payer: Medicare PPO

## 2020-07-15 ENCOUNTER — Other Ambulatory Visit: Payer: Self-pay

## 2020-07-15 DIAGNOSIS — N1832 Chronic kidney disease, stage 3b: Secondary | ICD-10-CM

## 2020-07-15 DIAGNOSIS — E785 Hyperlipidemia, unspecified: Secondary | ICD-10-CM

## 2020-07-15 DIAGNOSIS — E559 Vitamin D deficiency, unspecified: Secondary | ICD-10-CM | POA: Diagnosis not present

## 2020-07-15 DIAGNOSIS — M1A9XX0 Chronic gout, unspecified, without tophus (tophi): Secondary | ICD-10-CM | POA: Diagnosis not present

## 2020-07-15 LAB — CBC WITH DIFFERENTIAL/PLATELET
Basophils Absolute: 0 10*3/uL (ref 0.0–0.1)
Basophils Relative: 0.3 % (ref 0.0–3.0)
Eosinophils Absolute: 0.2 10*3/uL (ref 0.0–0.7)
Eosinophils Relative: 3.1 % (ref 0.0–5.0)
HCT: 37 % — ABNORMAL LOW (ref 39.0–52.0)
Hemoglobin: 12.5 g/dL — ABNORMAL LOW (ref 13.0–17.0)
Lymphocytes Relative: 27.4 % (ref 12.0–46.0)
Lymphs Abs: 1.5 10*3/uL (ref 0.7–4.0)
MCHC: 33.7 g/dL (ref 30.0–36.0)
MCV: 82.9 fl (ref 78.0–100.0)
Monocytes Absolute: 0.6 10*3/uL (ref 0.1–1.0)
Monocytes Relative: 10.2 % (ref 3.0–12.0)
Neutro Abs: 3.3 10*3/uL (ref 1.4–7.7)
Neutrophils Relative %: 59 % (ref 43.0–77.0)
Platelets: 216 10*3/uL (ref 150.0–400.0)
RBC: 4.46 Mil/uL (ref 4.22–5.81)
RDW: 14.8 % (ref 11.5–15.5)
WBC: 5.6 10*3/uL (ref 4.0–10.5)

## 2020-07-15 LAB — LDL CHOLESTEROL, DIRECT: Direct LDL: 55 mg/dL

## 2020-07-15 LAB — COMPREHENSIVE METABOLIC PANEL
ALT: 16 U/L (ref 0–53)
AST: 15 U/L (ref 0–37)
Albumin: 4.3 g/dL (ref 3.5–5.2)
Alkaline Phosphatase: 100 U/L (ref 39–117)
BUN: 24 mg/dL — ABNORMAL HIGH (ref 6–23)
CO2: 28 mEq/L (ref 19–32)
Calcium: 9.5 mg/dL (ref 8.4–10.5)
Chloride: 105 mEq/L (ref 96–112)
Creatinine, Ser: 1.64 mg/dL — ABNORMAL HIGH (ref 0.40–1.50)
GFR: 40.77 mL/min — ABNORMAL LOW (ref >60.00)
Glucose, Bld: 92 mg/dL (ref 70–99)
Potassium: 4.2 mEq/L (ref 3.5–5.1)
Sodium: 139 mEq/L (ref 135–145)
Total Bilirubin: 0.8 mg/dL (ref 0.2–1.2)
Total Protein: 7.1 g/dL (ref 6.0–8.3)

## 2020-07-15 LAB — LIPID PANEL
Cholesterol: 117 mg/dL (ref 0–200)
HDL: 30.5 mg/dL — ABNORMAL LOW (ref >39.00)
NonHDL: 86.44
Total CHOL/HDL Ratio: 4
Triglycerides: 265 mg/dL — ABNORMAL HIGH (ref 0.0–149.0)
VLDL: 53 mg/dL — ABNORMAL HIGH (ref 0.0–40.0)

## 2020-07-15 LAB — URIC ACID: Uric Acid, Serum: 6.1 mg/dL (ref 4.0–7.8)

## 2020-07-15 LAB — VITAMIN D 25 HYDROXY (VIT D DEFICIENCY, FRACTURES): VITD: 45.21 ng/mL (ref 30.00–100.00)

## 2020-07-22 ENCOUNTER — Ambulatory Visit (INDEPENDENT_AMBULATORY_CARE_PROVIDER_SITE_OTHER): Payer: Medicare PPO | Admitting: Family Medicine

## 2020-07-22 ENCOUNTER — Other Ambulatory Visit: Payer: Self-pay

## 2020-07-22 ENCOUNTER — Encounter: Payer: Self-pay | Admitting: Family Medicine

## 2020-07-22 VITALS — BP 132/70 | HR 62 | Temp 97.9°F | Ht 69.5 in | Wt 202.2 lb

## 2020-07-22 DIAGNOSIS — M1A9XX0 Chronic gout, unspecified, without tophus (tophi): Secondary | ICD-10-CM

## 2020-07-22 DIAGNOSIS — E559 Vitamin D deficiency, unspecified: Secondary | ICD-10-CM

## 2020-07-22 DIAGNOSIS — Z0000 Encounter for general adult medical examination without abnormal findings: Secondary | ICD-10-CM | POA: Diagnosis not present

## 2020-07-22 DIAGNOSIS — K219 Gastro-esophageal reflux disease without esophagitis: Secondary | ICD-10-CM

## 2020-07-22 DIAGNOSIS — E782 Mixed hyperlipidemia: Secondary | ICD-10-CM

## 2020-07-22 DIAGNOSIS — Z7189 Other specified counseling: Secondary | ICD-10-CM

## 2020-07-22 DIAGNOSIS — N1832 Chronic kidney disease, stage 3b: Secondary | ICD-10-CM

## 2020-07-22 DIAGNOSIS — I2510 Atherosclerotic heart disease of native coronary artery without angina pectoris: Secondary | ICD-10-CM

## 2020-07-22 DIAGNOSIS — I10 Essential (primary) hypertension: Secondary | ICD-10-CM

## 2020-07-22 MED ORDER — OMEPRAZOLE 20 MG PO CPDR: 20.0000 mg | DELAYED_RELEASE_CAPSULE | Freq: Every day | ORAL | 3 refills | Status: DC

## 2020-07-22 MED ORDER — METOPROLOL SUCCINATE ER 25 MG PO TB24: 25.0000 mg | ORAL_TABLET | Freq: Every day | ORAL | 3 refills | Status: DC

## 2020-07-22 MED ORDER — AMLODIPINE BESYLATE 10 MG PO TABS: 10.0000 mg | ORAL_TABLET | Freq: Every day | ORAL | 3 refills | Status: DC

## 2020-07-22 MED ORDER — ATORVASTATIN CALCIUM 20 MG PO TABS: 20.0000 mg | ORAL_TABLET | Freq: Every day | ORAL | 3 refills | Status: DC

## 2020-07-22 MED ORDER — COLCHICINE 0.6 MG PO CAPS: 1.0000 | ORAL_CAPSULE | Freq: Every day | ORAL | 3 refills | Status: DC | PRN

## 2020-07-22 MED ORDER — LISINOPRIL 40 MG PO TABS: 40.0000 mg | ORAL_TABLET | Freq: Every day | ORAL | 3 refills | Status: DC

## 2020-07-22 NOTE — Patient Instructions (Addendum)
Bring [place] copy of your advanced directive.  Good to see you today If interested, check with pharmacy about new 2 shot shingles series (shingrix).  Return in 6 months for labs only (recheck kidneys).  Return as needed or in 1 year for next physical.   Health Maintenance After Age 75 After age 76, you are at a higher risk for certain long-term diseases and infections as well as injuries from falls. Falls are a major cause of broken bones and head injuries in people who are older than age 29. Getting regular preventive care can help to keep you healthy and well. Preventive care includes getting regular testing and making lifestyle changes as recommended by your health care provider. Talk with your health care provider about:  Which screenings and tests you should have. A screening is a test that checks for a disease when you have no symptoms.  A diet and exercise plan that is right for you. What should I know about screenings and tests to prevent falls? Screening and testing are the best ways to find a health problem early. Early diagnosis and treatment give you the best chance of managing medical conditions that are common after age 38. Certain conditions and lifestyle choices may make you more likely to have a fall. Your health care provider may recommend:  Regular vision checks. Poor vision and conditions such as cataracts can make you more likely to have a fall. If you wear glasses, make sure to get your prescription updated if your vision changes.  Medicine review. Work with your health care provider to regularly review all of the medicines you are taking, including over-the-counter medicines. Ask your health care provider about any side effects that may make you more likely to have a fall. Tell your health care provider if any medicines that you take make you feel dizzy or sleepy.  Osteoporosis screening. Osteoporosis is a condition that causes the bones to get weaker. This can make the bones  weak and cause them to break more easily.  Blood pressure screening. Blood pressure changes and medicines to control blood pressure can make you feel dizzy.  Strength and balance checks. Your health care provider may recommend certain tests to check your strength and balance while standing, walking, or changing positions.  Foot health exam. Foot pain and numbness, as well as not wearing proper footwear, can make you more likely to have a fall.  Depression screening. You may be more likely to have a fall if you have a fear of falling, feel emotionally low, or feel unable to do activities that you used to do.  Alcohol use screening. Using too much alcohol can affect your balance and may make you more likely to have a fall. What actions can I take to lower my risk of falls? General instructions  Talk with your health care provider about your risks for falling. Tell your health care provider if: ? You fall. Be sure to tell your health care provider about all falls, even ones that seem minor. ? You feel dizzy, sleepy, or off-balance.  Take over-the-counter and prescription medicines only as told by your health care provider. These include any supplements.  Eat a healthy diet and maintain a healthy weight. A healthy diet includes low-fat dairy products, low-fat (lean) meats, and fiber from whole grains, beans, and lots of fruits and vegetables. Home safety  Remove any tripping hazards, such as rugs, cords, and clutter.  Install safety equipment such as grab bars in bathrooms and  safety rails on stairs.  Keep rooms and walkways well-lit. Activity  Follow a regular exercise program to stay fit. This will help you maintain your balance. Ask your health care provider what types of exercise are appropriate for you.  If you need a cane or walker, use it as recommended by your health care provider.  Wear supportive shoes that have nonskid soles.   Lifestyle  Do not drink alcohol if your  health care provider tells you not to drink.  If you drink alcohol, limit how much you have: ? 0-1 drink a day for women. ? 0-2 drinks a day for men.  Be aware of how much alcohol is in your drink. In the [place] one drink equals one typical bottle of beer (12 oz), one-half glass of wine (5 oz), or one shot of hard liquor (1 oz).  Do not use any products that contain nicotine or tobacco, such as cigarettes and e-cigarettes. If you need help quitting, ask your health care provider. Summary  Having a healthy lifestyle and getting preventive care can help to protect your health and wellness after age 63.  Screening and testing are the best way to find a health problem early and help you avoid having a fall. Early diagnosis and treatment give you the best chance for managing medical conditions that are more common for people who are older than age 83.  Falls are a major cause of broken bones and head injuries in people who are older than age 75. Take precautions to prevent a fall at home.  Work with your health care provider to learn what changes you can make to improve your health and wellness and to prevent falls. This information is not intended to replace advice given to you by your health care provider. Make sure you discuss any questions you have with your health care provider. Document Revised: 09/04/2018 Document Reviewed: 03/27/2017 Elsevier Patient Education  2021 [employer].

## 2020-07-22 NOTE — Assessment & Plan Note (Signed)
Preventative protocols reviewed and updated unless pt declined. Discussed healthy diet and lifestyle.  

## 2020-07-22 NOTE — Assessment & Plan Note (Signed)
Levels well replaced on daily 1000IU.

## 2020-07-22 NOTE — Assessment & Plan Note (Signed)
Continues PRN colchicine with good effect.

## 2020-07-22 NOTE — Assessment & Plan Note (Signed)
Has at home. Wife is HCPOA. Asked to bring [place] a copy.

## 2020-07-22 NOTE — Assessment & Plan Note (Signed)
Chronic, stable. Continue current regimen. 

## 2020-07-22 NOTE — Progress Notes (Signed)
Patient ID: Christian Kim, male    DOB: December 28, 1945, 75 y.o.   MRN: [id number]  This visit was conducted in person.  BP 132/70   Pulse 62   Temp 97.9 F (36.6 C) (Temporal)   Ht 5' 9.5" (1.765 m)   Wt 202 lb 4 oz (91.7 kg)   SpO2 96%   BMI 29.44 kg/m    CC: CPE Subjective:   HPI: Christian Kim is a 75 y.o. male presenting on 07/22/2020 for Medicare Wellness   Did not see health advisor this year.   Hearing Screening   125Hz  250Hz  500Hz  1000Hz  2000Hz  3000Hz  4000Hz  6000Hz  8000Hz   Right ear:   20 20 20   0    Left ear:   25 25 20   0      Visual Acuity Screening   Right eye Left eye Both eyes  Without correction: 20/30 20/25 20/25   With correction:       [place] Visit from 07/22/2020 in [place] at [place]  PHQ-2 Total Score 0      Fall Risk  07/22/2020 07/21/2019 07/08/2018 06/28/2017 07/02/2016  Falls in the past year? 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 0 0 1 No -    Chronic ursodiol use 2/2 recurrentcholedocholithiasisafter cholecystectomy.  CAD - sees cardiology yearly - good report. Stays active on farm.   Preventative: COLONOSCOPY Date: 06/2013 1 SSP, rpt 5 yrs [name]).  Colonoscopy 11/2019 - 1 TA, int hem, no f/u needed [name]).  Prostate cancer screening - no problems, aged out. Denies nocturia or LUTS sxs. Lung cancer screening - not eligible Flu shot yearly. COVID vaccine Pfizer 06/2019, 07/2019, 04/2020 Pneumovax 2012, prevnar 05/2014 Tdap 05/2015 Zostavax 2012 - rash after he got shot. shingrix - discussed covid vaccine - first shot last week (thinks [name]).  Advanced directives: Would want wife to be HCPOA.Has living will at home.[place] with life support if possibility of reversal but doesn't want prolonged life support if terminal condition.Asked to bring [place] a  copy.  Seat belt use discussed.  Sunscreen use discussed. No changing moles on skin.  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 Interim medical history since our last visit reviewed. Allergies and medications reviewed and updated. Outpatient Medications Prior to Visit  Medication Sig Dispense Refill  . aspirin 81 MG tablet Take 81 mg by mouth daily.    . Cholecalciferol (VITAMIN D3) 25 MCG (1000 UT) CAPS Take 1 capsule (1,000 Units total) by mouth daily. 30 capsule   . ibuprofen (ADVIL,MOTRIN) 200 MG tablet Take 200 mg by mouth every 6 (six) hours as needed for pain.    . probenecid (BENEMID) 500 MG tablet Take 0.5 tablets (250 mg total) by mouth 2 (two) times daily. 30 tablet 11  . triamcinolone cream (KENALOG) 0.1 % Apply 1  application topically as needed.     . ursodiol (ACTIGALL) 300 MG capsule Take 2 capsules (600 mg total) by mouth 2 (two) times daily. 360 capsule 1  . amLODipine (NORVASC) 10 MG tablet Take 1 tablet (10 mg total) by mouth daily. 90 tablet 3  . atorvastatin (LIPITOR) 20 MG tablet Take 1 tablet (20 mg total) by mouth daily. 90 tablet 3  . Colchicine (MITIGARE) 0.6 MG CAPS Take 1 capsule by mouth daily as needed (gout flare). Take 2 capsules on the first day of flare 30 capsule 3  . lisinopril (ZESTRIL) 40 MG tablet Take 1 tablet (40 mg total) by mouth daily. 90 tablet 3  . metoprolol succinate (TOPROL-XL) 25 MG 24 hr tablet Take 1 tablet (25 mg total) by mouth daily. 90 tablet 3  . omeprazole (PRILOSEC) 20 MG capsule Take 1 capsule (20 mg total) by mouth daily.  90 capsule 3   No facility-administered medications prior to visit.     Per HPI unless specifically indicated in ROS section below Review of Systems  Constitutional: Negative for activity change, appetite change, chills, fatigue, fever and unexpected weight change.  HENT: Negative for hearing loss.   Eyes: Negative for visual disturbance.  Respiratory: Negative for cough, chest tightness, shortness of breath and wheezing.   Cardiovascular: Negative for chest pain, palpitations and leg swelling.  Gastrointestinal: Negative for abdominal distention, abdominal pain, blood in stool, constipation, diarrhea, nausea and vomiting.  Genitourinary: Negative for difficulty urinating and hematuria.  Musculoskeletal: Negative for arthralgias, myalgias and neck pain.  Skin: Negative for rash.  Neurological: Negative for dizziness, seizures, syncope and headaches.  Hematological: Negative for adenopathy. Does not bruise/bleed easily.  Psychiatric/Behavioral: Negative for dysphoric mood. The patient is not nervous/anxious.    Objective:  BP 132/70   Pulse 62   Temp 97.9 F (36.6 C) (Temporal)   Ht 5' 9.5" (1.765 m)   Wt 202 lb 4 oz (91.7 kg)   SpO2 96%   BMI 29.44 kg/m   Wt Readings from Last 3 Encounters:  07/22/20 202 lb 4 oz (91.7 kg)  12/22/19 (!) 203 lb (92.1 kg)  12/10/19 200 lb 9.6 oz (91 kg)      Physical Exam Vitals and nursing note reviewed.  Constitutional:      General: He is not in acute distress.    Appearance: Normal appearance. He is well-developed and well-nourished. He is not ill-appearing.  HENT:     Head: Normocephalic and atraumatic.     Right Ear: Hearing, tympanic membrane, ear canal and external ear normal.     Left Ear: Hearing, tympanic membrane, ear canal and external ear normal.     Mouth/Throat:     Mouth: Oropharynx is clear and moist and mucous membranes are normal.     Pharynx: No posterior oropharyngeal edema.  Eyes:     General: No scleral icterus.     Extraocular Movements: EOM normal.     Conjunctiva/sclera: Conjunctivae normal.     Pupils: Pupils are equal, round, and reactive to light.  Neck:     Thyroid: No thyroid mass or thyromegaly.     Vascular: No carotid bruit.  Cardiovascular:     Rate and Rhythm: Normal rate and regular rhythm.     Pulses: Normal pulses and intact distal pulses.          Radial pulses are 2+ on the right side and 2+ on the left side.     Heart sounds: Normal heart sounds. No  murmur heard.   Pulmonary:     Effort: Pulmonary effort is normal. No respiratory distress.     Breath sounds: Normal breath sounds. No wheezing, rhonchi or rales.  Abdominal:     General: Abdomen is flat. Bowel sounds are normal. There is no distension.     Palpations: Abdomen is soft. There is no mass.     Tenderness: There is no abdominal tenderness. There is no guarding or rebound.     Hernia: No hernia is present.  Musculoskeletal:        General: No edema. Normal range of motion.     Cervical back: Normal range of motion and neck supple.     Right lower leg: No edema.     Left lower leg: No edema.  Lymphadenopathy:     Cervical: No cervical adenopathy.  Skin:    General: Skin is warm and dry.     Findings: No rash.  Neurological:     General: No focal deficit present.     Mental Status: He is alert and oriented to person, place, and time.     Comments:  CN grossly intact, station and gait intact Recall 3/3 Calculation 5/5 DLROW  Psychiatric:        Mood and Affect: Mood and affect and mood normal.        Behavior: Behavior normal.        Thought Content: Thought content normal.        Judgment: Judgment normal.       Results for orders placed or performed in visit on 07/15/20  VITAMIN D 25 Hydroxy (Vit-D Deficiency, Fractures)  Result Value Ref Range   VITD 45.21 30.00 - 100.00 ng/mL  CBC with Differential/Platelet  Result Value Ref Range   WBC 5.6 4.0 - 10.5 K/uL   RBC 4.46 4.22 - 5.81 Mil/uL    Hemoglobin 12.5 (L) 13.0 - 17.0 g/dL   HCT 37.0 (L) 39.0 - 52.0 %   MCV 82.9 78.0 - 100.0 fl   MCHC 33.7 30.0 - 36.0 g/dL   RDW 14.8 11.5 - 15.5 %   Platelets 216.0 150.0 - 400.0 K/uL   Neutrophils Relative % 59.0 43.0 - 77.0 %   Lymphocytes Relative 27.4 12.0 - 46.0 %   Monocytes Relative 10.2 3.0 - 12.0 %   Eosinophils Relative 3.1 0.0 - 5.0 %   Basophils Relative 0.3 0.0 - 3.0 %   Neutro Abs 3.3 1.4 - 7.7 K/uL   Lymphs Abs 1.5 0.7 - 4.0 K/uL   Monocytes Absolute 0.6 0.1 - 1.0 K/uL   Eosinophils Absolute 0.2 0.0 - 0.7 K/uL   Basophils Absolute 0.0 0.0 - 0.1 K/uL  Uric acid  Result Value Ref Range   Uric Acid, Serum 6.1 4.0 - 7.8 mg/dL  Comprehensive metabolic panel  Result Value Ref Range   Sodium 139 135 - 145 mEq/L   Potassium 4.2 3.5 - 5.1 mEq/L   Chloride 105 96 - 112 mEq/L   CO2 28 19 - 32 mEq/L   Glucose, Bld 92 70 - 99 mg/dL   BUN 24 (H) 6 - 23 mg/dL   Creatinine, Ser 1.64 (H) 0.40 - 1.50 mg/dL   Total Bilirubin 0.8 0.2 - 1.2 mg/dL   Alkaline Phosphatase 100 39 - 117 U/L   AST 15 0 - 37 U/L   ALT 16 0 - 53 U/L   Total Protein 7.1 6.0 - 8.3 g/dL   Albumin 4.3 3.5 - 5.2 g/dL  GFR 40.77 (L) >60.00 mL/min   Calcium 9.5 8.4 - 10.5 mg/dL  Lipid panel  Result Value Ref Range   Cholesterol 117 0 - 200 mg/dL   Triglycerides 265.0 (H) 0.0 - 149.0 mg/dL   HDL 30.50 (L) >39.00 mg/dL   VLDL 53.0 (H) 0.0 - 40.0 mg/dL   Total CHOL/HDL Ratio 4    NonHDL 86.44   LDL cholesterol, direct  Result Value Ref Range   Direct LDL 55.0 mg/dL   Lab Results  Component Value Date   PSA 0.67 10/20/2019   PSA 1.69 06/28/2017   PSA 1.00 06/19/2016     Assessment & Plan:  This visit occurred during the SARS-CoV-2 public health emergency.  Safety protocols were in place, including screening questions prior to the visit, additional usage of staff PPE, and extensive cleaning of exam room while observing appropriate contact time as indicated for disinfecting solutions.   Problem List  Items Addressed This Visit    Vitamin D deficiency    Levels well replaced on daily 1000IU.       Medicare annual wellness visit, subsequent - Primary    I have personally reviewed the Medicare Annual Wellness questionnaire and have noted 1. The patient's medical and social history 2. Their use of alcohol, tobacco or illicit drugs 3. Their current medications and supplements 4. The patient's functional ability including ADL's, fall risks, home safety risks and hearing or visual impairment. Cognitive function has been assessed and addressed as indicated.  5. Diet and physical activity 6. Evidence for depression or mood disorders The patients weight, height, BMI have been recorded in the chart. I have made referrals, counseling and provided education to the patient based on review of the above and I have provided the pt with a written personalized care plan for preventive services. Provider list updated.. See scanned questionairre as needed for further documentation. Reviewed preventative protocols and updated unless pt declined.       HLD (hyperlipidemia)    Chronic, LDL at goal. Triglycerides high - reviewed dietary choices to improve triglycerides. Consider addition of vascepa to attain goal triglycerides. Recheck in 6 mo The ASCVD Risk score Mikey Bussing DC Jr., et al., 2013) failed to calculate for the following reasons:   The valid total cholesterol range is 130 to 320 mg/dL       Relevant Medications   metoprolol succinate (TOPROL-XL) 25 MG 24 hr tablet   lisinopril (ZESTRIL) 40 MG tablet   atorvastatin (LIPITOR) 20 MG tablet   amLODipine (NORVASC) 10 MG tablet   Health maintenance examination    Preventative protocols reviewed and updated unless pt declined. Discussed healthy diet and lifestyle.       Gout    Continues PRN colchicine with good effect.       Gastroesophageal reflux disease without esophagitis    Continue omeprazole 20mg  daily.       Relevant Medications    omeprazole (PRILOSEC) 20 MG capsule   Essential hypertension    Chronic, stable. Continue current regimen.       Relevant Medications   metoprolol succinate (TOPROL-XL) 25 MG 24 hr tablet   lisinopril (ZESTRIL) 40 MG tablet   atorvastatin (LIPITOR) 20 MG tablet   amLODipine (NORVASC) 10 MG tablet   Coronary atherosclerosis    Appreciate cards care.       Relevant Medications   metoprolol succinate (TOPROL-XL) 25 MG 24 hr tablet   lisinopril (ZESTRIL) 40 MG tablet   atorvastatin (LIPITOR) 20 MG tablet  amLODipine (NORVASC) 10 MG tablet   Chronic kidney disease, stage 3b ([place])    Again reviewed CKD and discussed importance of good hydration status.       Advanced care planning/counseling discussion    Has at home. Wife is HCPOA. Asked to bring [place] a copy.           Meds ordered this encounter  Medications  . omeprazole (PRILOSEC) 20 MG capsule    Sig: Take 1 capsule (20 mg total) by mouth daily.    Dispense:  90 capsule    Refill:  3  . metoprolol succinate (TOPROL-XL) 25 MG 24 hr tablet    Sig: Take 1 tablet (25 mg total) by mouth daily.    Dispense:  90 tablet    Refill:  3  . lisinopril (ZESTRIL) 40 MG tablet    Sig: Take 1 tablet (40 mg total) by mouth daily.    Dispense:  90 tablet    Refill:  3  . Colchicine (MITIGARE) 0.6 MG CAPS    Sig: Take 1 capsule by mouth daily as needed (gout flare). Take 2 capsules on the first day of flare    Dispense:  30 capsule    Refill:  3  . atorvastatin (LIPITOR) 20 MG tablet    Sig: Take 1 tablet (20 mg total) by mouth daily.    Dispense:  90 tablet    Refill:  3  . amLODipine (NORVASC) 10 MG tablet    Sig: Take 1 tablet (10 mg total) by mouth daily.    Dispense:  90 tablet    Refill:  3   No orders of the defined types were placed in this encounter.   Patient instructions: Bring [place] copy of your advanced directive.  Good to see you today If interested, check with pharmacy about new 2 shot shingles series (shingrix).   Return in 6 months for labs only (recheck kidneys).  Return as needed or in 1 year for next physical.   Follow up plan: Return in about 1 year (around 07/22/2021) for annual exam, prior fasting for blood work, medicare wellness visit.  Ria Bush, MD

## 2020-07-22 NOTE — Assessment & Plan Note (Signed)
Again reviewed CKD and discussed importance of good hydration status.

## 2020-07-22 NOTE — Assessment & Plan Note (Signed)

## 2020-07-22 NOTE — Assessment & Plan Note (Signed)
Appreciate cards care. 

## 2020-07-22 NOTE — Assessment & Plan Note (Signed)
Chronic, LDL at goal. Triglycerides high - reviewed dietary choices to improve triglycerides. Consider addition of vascepa to attain goal triglycerides. Recheck in 6 mo The ASCVD Risk score Mikey Bussing DC Jr., et al., 2013) failed to calculate for the following reasons:   The valid total cholesterol range is 130 to 320 mg/dL

## 2020-07-22 NOTE — Assessment & Plan Note (Signed)
-  Continue omeprazole 20 mg daily

## 2020-07-27 ENCOUNTER — Telehealth: Payer: Self-pay

## 2020-07-27 NOTE — Telephone Encounter (Signed)
plz send in Rx for mitigare as it should be equivalent to generic colchicine.  thank you.

## 2020-07-27 NOTE — Telephone Encounter (Signed)
Received faxed PA request from CoverMyMeds.  When trying to submit PA, got message stating [employer] covered (formulary) drug is Mitigare capsule.  Do you want to send rx for name brand Mitigare cap or should I continue PA?

## 2020-07-28 MED ORDER — MITIGARE 0.6 MG PO CAPS: 1.0000 | ORAL_CAPSULE | Freq: Every day | ORAL | 3 refills | Status: AC | PRN

## 2020-07-28 NOTE — Addendum Note (Signed)
Addended by: Brenton Grills on: 06/30/3359 22:44 AM   Modules accepted: Orders

## 2020-07-28 NOTE — Telephone Encounter (Signed)
E-scribed new rx for brand Mitigare DAW.

## 2020-11-29 ENCOUNTER — Other Ambulatory Visit: Payer: Self-pay | Admitting: Family Medicine

## 2020-11-30 NOTE — Telephone Encounter (Signed)
Benemid Last filled:  10/04/20, #30 Last OV:  07/22/20, AWV Next OV:  07/28/21, AWV prt 2

## 2020-12-05 NOTE — Progress Notes (Signed)
Cardiology Office Note    Date:  12/07/2020   ID:  [name], DOB [birth year], MRN [id number]   PCP:  Ria Bush, [facility]  Cardiologist:  Sherren Mocha, MD   Advanced Practice Provider:  No care team member to display Electrophysiologist:  None   [phone]   Chief Complaint  Patient presents with   Follow-up     History of Present Illness:  Christian Kim is a 75 y.o. male with history of CAD status post NSTEMI 2008 treated with complex PCI of the left circumflex and a long area of total occlusion treated with overlapping DES, staged PCI to the RCA, moderate LAD stenosis managed medically, normal stress echo 2018, hypertension, HLD.   Patient last saw Dr. Burt Knack 12/10/2019 at which time he was doing well.  Patient comes in for yearly f/u. 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 No chest pain, dyspnea, edema. Crt 1.64 06/2020, LDL 86.    Past Medical History:  Diagnosis Date   CAD (coronary artery disease) 2008   stent   CAP (community acquired pneumonia) 03/07/2015   Choledocholithiasis    recurrent/multiple ERCPs with stone extraction and sphincterotomy    CHOLEDOCHOLITHIASIS 09/20/2007   Qualifier: Diagnosis of  By: Roxan Hockey, [place]     GERD (gastroesophageal reflux disease)    Gout 2013   dx by crystal analysis   Hyperlipidemia    Hypertension    Myocardial infarction [facility]) 12-2006   Prostatitis 01/22/2017    Past Surgical History:  Procedure Laterality Date   CHOLECYSTECTOMY  2006   w/ revision 2/2 persistent gallstones   COLONOSCOPY  06/2013   1 polyp, rpt 5 yrs [name])   COLONOSCOPY  11/2019   1 TA, int hem, no f/u needed [name]).    CORONARY ANGIOPLASTY WITH STENT PLACEMENT  2008   stent, Dr. Hyman Hopes   NASAL SEPTUM SURGERY      Current Medications: Current Meds  Medication Sig   amLODipine (NORVASC) 10 MG tablet Take 1 tablet (10 mg total) by mouth  daily.   aspirin 81 MG tablet Take 81 mg by mouth daily.   atorvastatin (LIPITOR) 20 MG tablet Take 1 tablet (20 mg total) by mouth daily.   Cholecalciferol (VITAMIN D3) 25 MCG (1000 UT) CAPS Take 1 capsule (1,000 Units total) by mouth daily.   ibuprofen (ADVIL,MOTRIN) 200 MG tablet Take 200 mg by mouth every 6 (six) hours as needed for pain.   lisinopril (ZESTRIL) 40 MG tablet Take 1 tablet (40 mg total) by mouth daily.   metoprolol succinate (TOPROL-XL) 25 MG 24 hr tablet Take 1 tablet (25 mg total) by mouth daily.   MITIGARE 0.6 MG CAPS Take 1 capsule by mouth daily as needed (gout flare). Take 2 capsules on the first day of flare   omeprazole (PRILOSEC) 20 MG capsule Take 1 capsule (20 mg total) by mouth daily.   probenecid (BENEMID) 500 MG tablet TAKE 1/2 A TABLET (250 MG TOTAL) BY MOUTH 2 TIMES DAILY.   triamcinolone cream (KENALOG) 0.1 % Apply 1 application topically as needed.    ursodiol (ACTIGALL) 300 MG capsule Take 2 capsules (600 mg total) by mouth 2 (two) times daily.     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   Activity: works farm to stay active, rabbit hunting   Diet: good water, fruits/vegetables  daily   Social Determinants of Radio broadcast assistant Strain: Not on file  Food Insecurity: Not on file  Transportation Needs: Not on file  Physical Activity: Not on file  Stress: Not on file  Social Connections: Not on file     Family History:  The patient's  family history includes Liver cancer in his mother; Lung cancer in his mother.   ROS:   Please see the history of present illness.    ROS All other systems reviewed and are negative.   PHYSICAL EXAM:   VS:  BP 116/68   Pulse (!) 54   Ht 5\' 10"  (1.778 m)   Wt 197 lb 6.4 oz (89.5 kg)   SpO2 97%   BMI 28.32 kg/m   Physical Exam  GEN: Well nourished, well developed, in no acute distress  Neck: no JVD, carotid bruits, or masses Cardiac:RRR; no murmurs, rubs, or gallops  Respiratory:  clear to auscultation bilaterally, normal work of breathing GI: soft, nontender, nondistended, + BS Ext: without cyanosis, clubbing, or edema, Good distal pulses bilaterally Neuro:  Alert and Oriented x 3 Psych: euthymic mood, full affect  Wt Readings from Last 3 Encounters:  12/07/20 197 lb 6.4 oz (89.5 kg)  07/22/20 202 lb 4 oz (91.7 kg)  12/22/19 (!) 203 lb (92.1 kg)      Studies/Labs Reviewed:   EKG:  EKG is  ordered today.  The ekg ordered today demonstrates sinus bradycardia, no change  Recent Labs: 07/15/2020: ALT 16; BUN 24; Creatinine, Ser 1.64; Hemoglobin 12.5; Platelets 216.0; Potassium 4.2; Sodium 139   Lipid Panel    Component Value Date/Time   CHOL 117 07/15/2020 1044   TRIG 265.0 (H) 07/15/2020 1044   HDL 30.50 (L) 07/15/2020 1044   CHOLHDL 4 07/15/2020 1044   VLDL 53.0 (H) 07/15/2020 1044   LDLCALC 38 07/13/2019 0846   LDLDIRECT 55.0 07/15/2020 1044    Additional studies/ records that were reviewed today include:  Stress echo 10/17/2016: Impressions:   - Negative stress echo.    NO evidence of ischemia .   Stress results:   Maximal heart rate during stress was 134 bpm (90%  of maximal predicted  heart rate). The maximal predicted heart rate  was 149 bpm.The target heart rate was achieved. There was a normal  resting blood pressure with a hypertensive response to stress. The  rate-pressure product for the peak heart rate and blood pressure  was 24955 mm Hg/min.   Risk Assessment/Calculations:         ASSESSMENT:    1. Coronary artery disease involving native  coronary artery of native heart without angina pectoris   2. Essential hypertension   3. Mixed hyperlipidemia   4. Stage 3 chronic kidney disease, unspecified whether stage 3a or 3b CKD ([place])      PLAN:  In order of problems listed above:  CAD status post NSTEMI 2008 treated with complex PCI of the left circumflex and long area of total occlusion treated with overlapping DES, staged PCI to the RCA, residual moderate LAD stenosis managed medically, negative stress echo 2018.  On aspirin, statin, metoprolol and ACE inhibitor.  Hypertension BP well controlled  Hyperlipidemia managed by PCP, has been stable.  CKD stage 3 Crt 1.64 in Feb, on lisinopril, followed closely by PCP.  Shared Decision Making/Informed Consent        Medication Adjustments/Labs and Tests Ordered: Current medicines are reviewed at length with the patient today.  Concerns regarding medicines are outlined above.  Medication changes, Labs and Tests ordered today are listed in the Patient Instructions below. Patient Instructions  Medication Instructions:  Your physician recommends that you continue on your current medications as directed. Please refer to the Current Medication list given to you today.  *If you need a refill on your cardiac medications before your next appointment, please call your pharmacy*   Lab Work: None If you have labs (blood work) drawn today and your tests are completely normal, you will receive your results only by: [place] (if you have MyChart) OR A paper copy in the mail If you have any lab test that is  abnormal or we need to change your treatment, we will call you to review the results.   Follow-Up: At [facility], you and your health needs are our priority.  As part of our continuing mission to provide you with exceptional heart care, we have created designated Provider Care Teams.  These Care Teams include your primary Cardiologist (physician) and Advanced Practice Providers (APPs -  Physician Assistants and Nurse Practitioners) who all work together to provide you with the care you need, when you need it.   Your next appointment:   1 year(s)  The format for your next appointment:   In Person  Provider:   You may see Sherren Mocha, MD or one of the following Advanced Practice Providers on your designated Care Team:   [name], PA-C [place] [address] PA-C     Signed, Ermalinda Barrios, [place]  12/07/2020 10:37 AM    [place], [place], [place]  [address] Phone: [phone]; Fax: [phone]

## 2020-12-07 ENCOUNTER — Ambulatory Visit: Payer: Medicare PPO | Admitting: Physician Assistant

## 2020-12-07 ENCOUNTER — Encounter: Payer: Self-pay | Admitting: Physician Assistant

## 2020-12-07 ENCOUNTER — Other Ambulatory Visit: Payer: Self-pay

## 2020-12-07 VITALS — BP 116/68 | HR 54 | Ht 70.0 in | Wt 197.4 lb

## 2020-12-07 DIAGNOSIS — I10 Essential (primary) hypertension: Secondary | ICD-10-CM | POA: Diagnosis not present

## 2020-12-07 DIAGNOSIS — E782 Mixed hyperlipidemia: Secondary | ICD-10-CM

## 2020-12-07 DIAGNOSIS — I2510 Atherosclerotic heart disease of native coronary artery without angina pectoris: Secondary | ICD-10-CM

## 2020-12-07 DIAGNOSIS — N183 Chronic kidney disease, stage 3 unspecified: Secondary | ICD-10-CM | POA: Diagnosis not present

## 2020-12-07 NOTE — Patient Instructions (Signed)
Medication Instructions:  Your physician recommends that you continue on your current medications as directed. Please refer to the Current Medication list given to you today.  *If you need a refill on your cardiac medications before your next appointment, please call your pharmacy*   Lab Work: None If you have labs (blood work) drawn today and your tests are completely normal, you will receive your results only by: [place] (if you have MyChart) OR A paper copy in the mail If you have any lab test that is abnormal or we need to change your treatment, we will call you to review the results.   Follow-Up: At [facility], you and your health needs are our priority.  As part of our continuing mission to provide you with exceptional heart care, we have created designated Provider Care Teams.  These Care Teams include your primary Cardiologist (physician) and Advanced Practice Providers (APPs -  Physician Assistants and Nurse Practitioners) who all work together to provide you with the care you need, when you need it.   Your next appointment:   1 year(s)  The format for your next appointment:   In Person  Provider:   You may see Sherren Mocha, MD or one of the following Advanced Practice Providers on your designated Care Team:   [name], PA-C [place], [place]

## 2021-01-18 ENCOUNTER — Other Ambulatory Visit: Payer: Self-pay | Admitting: Family Medicine

## 2021-01-18 DIAGNOSIS — N1832 Chronic kidney disease, stage 3b: Secondary | ICD-10-CM

## 2021-01-19 ENCOUNTER — Other Ambulatory Visit (INDEPENDENT_AMBULATORY_CARE_PROVIDER_SITE_OTHER): Payer: Medicare PPO

## 2021-01-19 ENCOUNTER — Other Ambulatory Visit: Payer: Self-pay

## 2021-01-19 DIAGNOSIS — N1832 Chronic kidney disease, stage 3b: Secondary | ICD-10-CM

## 2021-01-19 LAB — CBC WITH DIFFERENTIAL/PLATELET
Basophils Absolute: 0 10*3/uL (ref 0.0–0.1)
Basophils Relative: 0.4 % (ref 0.0–3.0)
Eosinophils Absolute: 0.3 10*3/uL (ref 0.0–0.7)
Eosinophils Relative: 4.8 % (ref 0.0–5.0)
HCT: 36 % — ABNORMAL LOW (ref 39.0–52.0)
Hemoglobin: 11.6 g/dL — ABNORMAL LOW (ref 13.0–17.0)
Lymphocytes Relative: 22.9 % (ref 12.0–46.0)
Lymphs Abs: 1.5 10*3/uL (ref 0.7–4.0)
MCHC: 32.3 g/dL (ref 30.0–36.0)
MCV: 81.5 fl (ref 78.0–100.0)
Monocytes Absolute: 0.7 10*3/uL (ref 0.1–1.0)
Monocytes Relative: 11.2 % (ref 3.0–12.0)
Neutro Abs: 4 10*3/uL (ref 1.4–7.7)
Neutrophils Relative %: 60.7 % (ref 43.0–77.0)
Platelets: 211 10*3/uL (ref 150.0–400.0)
RBC: 4.41 Mil/uL (ref 4.22–5.81)
RDW: 15.4 % (ref 11.5–15.5)
WBC: 6.5 10*3/uL (ref 4.0–10.5)

## 2021-01-19 LAB — RENAL FUNCTION PANEL
Albumin: 4 g/dL (ref 3.5–5.2)
BUN: 21 mg/dL (ref 6–23)
CO2: 26 mEq/L (ref 19–32)
Calcium: 9.2 mg/dL (ref 8.4–10.5)
Chloride: 105 mEq/L (ref 96–112)
Creatinine, Ser: 1.6 mg/dL — ABNORMAL HIGH (ref 0.40–1.50)
GFR: 41.85 mL/min — ABNORMAL LOW (ref >60.00)
Glucose, Bld: 87 mg/dL (ref 70–99)
Phosphorus: 3.5 mg/dL (ref 2.3–4.6)
Potassium: 4.2 mEq/L (ref 3.5–5.1)
Sodium: 140 mEq/L (ref 135–145)

## 2021-01-20 LAB — PARATHYROID HORMONE, INTACT (NO CA): PTH: 39 pg/mL (ref 16–77)

## 2021-01-31 DIAGNOSIS — H524 Presbyopia: Secondary | ICD-10-CM | POA: Diagnosis not present

## 2021-01-31 DIAGNOSIS — Z135 Encounter for screening for eye and ear disorders: Secondary | ICD-10-CM | POA: Diagnosis not present

## 2021-01-31 DIAGNOSIS — H52223 Regular astigmatism, bilateral: Secondary | ICD-10-CM | POA: Diagnosis not present

## 2021-01-31 DIAGNOSIS — H04123 Dry eye syndrome of bilateral lacrimal glands: Secondary | ICD-10-CM | POA: Diagnosis not present

## 2021-01-31 DIAGNOSIS — H2513 Age-related nuclear cataract, bilateral: Secondary | ICD-10-CM | POA: Diagnosis not present

## 2021-01-31 DIAGNOSIS — H5203 Hypermetropia, bilateral: Secondary | ICD-10-CM | POA: Diagnosis not present

## 2021-02-25 ENCOUNTER — Other Ambulatory Visit: Payer: Self-pay | Admitting: Internal Medicine

## 2021-03-28 ENCOUNTER — Other Ambulatory Visit: Payer: Self-pay | Admitting: Internal Medicine

## 2021-04-27 ENCOUNTER — Ambulatory Visit: Payer: Medicare PPO | Admitting: Physician Assistant

## 2021-04-27 ENCOUNTER — Other Ambulatory Visit: Payer: Medicare PPO

## 2021-04-27 ENCOUNTER — Encounter: Payer: Self-pay | Admitting: Physician Assistant

## 2021-04-27 VITALS — BP 150/70 | HR 59 | Ht 70.0 in | Wt 200.0 lb

## 2021-04-27 DIAGNOSIS — K219 Gastro-esophageal reflux disease without esophagitis: Secondary | ICD-10-CM

## 2021-04-27 DIAGNOSIS — K8050 Calculus of bile duct without cholangitis or cholecystitis without obstruction: Secondary | ICD-10-CM | POA: Diagnosis not present

## 2021-04-27 LAB — HEPATIC FUNCTION PANEL
ALT: 14 U/L (ref 0–53)
AST: 14 U/L (ref 0–37)
Albumin: 4.4 g/dL (ref 3.5–5.2)
Alkaline Phosphatase: 92 U/L (ref 39–117)
Bilirubin, Direct: 0.1 mg/dL (ref 0.0–0.3)
Total Bilirubin: 0.8 mg/dL (ref 0.2–1.2)
Total Protein: 7.1 g/dL (ref 6.0–8.3)

## 2021-04-27 MED ORDER — URSODIOL 300 MG PO CAPS: 600.0000 mg | ORAL_CAPSULE | Freq: Two times a day (BID) | ORAL | 3 refills | Status: DC

## 2021-04-27 MED ORDER — OMEPRAZOLE 20 MG PO CPDR: 20.0000 mg | DELAYED_RELEASE_CAPSULE | Freq: Every day | ORAL | 3 refills | Status: DC

## 2021-04-27 NOTE — Patient Instructions (Addendum)
If you are age 76 or older, your body mass index should be between 23-30. Your Body mass index is 28.7 kg/m. If this is out of the aforementioned range listed, please consider follow up with your Primary Care Provider. ________________________________________________________  The [facility] providers would like to encourage you to use [facility] to communicate with providers for non-urgent requests or questions.  Due to long hold times on the telephone, sending your provider a message by [facility] may be a faster and more efficient way to get a response.  Please allow 48 business hours for a response.  Please remember that this is for non-urgent requests.  _______________________________________________________  Your provider has requested that you go to the basement level for lab work before leaving today. Press "B" on the elevator. The lab is located at the first door on the left as you exit the elevator.  We have sent refills of your Omeprazole and Ursodiol to your pharmacy.  Follow up in 2 years.  Thank you for entrusting me with your care and choosing [facility].  Ellouise Newer, PA-C

## 2021-04-27 NOTE — Progress Notes (Signed)
Chief Complaint: Refill of ursodiol  HPI:    Christian Kim is a 75 year old male with a past medical history as listed below including choledocholithiasis now maintained on ursodiol, known to Dr. [name], who presents to clinic today for refill of his ursodiol.    06/2013 colonoscopy with a sessile serrated polyp.  Repeat recommended in 5 years.  At that time recommend a repeat colonoscopy in February 2020.    08/22/2017 patient seen in clinic by Dr. [name].  At that time discussed he had a history of chronic GERD for which he was maintained on a PPI.  Also history of recurrent choledocholithiasis for which she was status postcholecystectomy and repeat ERCPs with common duct clearance.  Given her current problems he was placed on Ursodiol.  Since initiating Ursodiol he had had no further recurrence of his common duct stones.  He was continued on Ursodiol 600 mg twice daily and Omeprazole 20 mg daily.    12/22/2019 colonoscopy with a 2 mm polyp in transverse colon and internal hemorrhoids.  Pathology showed tubular adenoma and repeat was not recommended due to age.    07/15/2020 CMP with normal LFTs.    Today, the patient presents to clinic and tells me that he is doing well on the Ursodiol 600 mg twice a day.  He has had no "pain like I used to and I had gallstones".  He does ask about the mechanism of action of this medicine.    Patient does describe occasional episodes of what sounds like epigastric pain/gastritis immediately after eating, apparently had something towards the spicy side the other day and had some pain which lasted for about 10 minutes and went away after he rubbed it.  He continues on his Omeprazole 20 mg daily.    Denies fever, chills, weight loss, abdominal pain, blood in his stool, nausea, vomiting or symptoms that awaken him from sleep.     Past Medical History:  Diagnosis Date   CAD (coronary artery disease) 2008   stent   CAP (community acquired pneumonia) 03/07/2015    Choledocholithiasis    recurrent/multiple ERCPs with stone extraction and sphincterotomy    CHOLEDOCHOLITHIASIS 09/20/2007   Qualifier: Diagnosis of  By: Roxan Hockey, [place]     GERD (gastroesophageal reflux disease)    Gout 2013   dx by crystal analysis   Hyperlipidemia    Hypertension    Myocardial infarction [facility]) 12-2006   Prostatitis 01/22/2017    Past Surgical History:  Procedure Laterality Date   CHOLECYSTECTOMY  2006   w/ revision 2/2 persistent gallstones   COLONOSCOPY  06/2013   1 polyp, rpt 5 yrs [name])   COLONOSCOPY  11/2019   1 TA, int hem, no f/u needed [name]).    CORONARY ANGIOPLASTY WITH STENT PLACEMENT  2008   stent, Dr. Hyman Hopes   NASAL SEPTUM SURGERY      Current Outpatient Medications  Medication Sig Dispense Refill   amLODipine (NORVASC) 10 MG tablet Take 1 tablet (10 mg total) by mouth daily. 90 tablet 3   aspirin 81 MG tablet Take 81 mg by mouth daily.     atorvastatin (LIPITOR) 20 MG tablet Take 1 tablet (20 mg total) by mouth daily. 90 tablet 3   Cholecalciferol (VITAMIN D3) 25 MCG (1000 UT) CAPS Take 1 capsule (1,000 Units total) by mouth daily. 30 capsule    ibuprofen (ADVIL,MOTRIN) 200 MG tablet Take 200 mg by mouth every 6 (six) hours as needed for pain.  lisinopril (ZESTRIL) 40 MG tablet Take 1 tablet (40 mg total) by mouth daily. 90 tablet 3   metoprolol succinate (TOPROL-XL) 25 MG 24 hr tablet Take 1 tablet (25 mg total) by mouth daily. 90 tablet 3   MITIGARE 0.6 MG CAPS Take 1 capsule by mouth daily as needed (gout flare). Take 2 capsules on the first day of flare 30 capsule 3   omeprazole (PRILOSEC) 20 MG capsule Take 1 capsule (20 mg total) by mouth daily. 90 capsule 3   probenecid (BENEMID) 500 MG tablet TAKE 1/2 A TABLET (250 MG TOTAL) BY MOUTH 2 TIMES DAILY. 30 tablet 11   triamcinolone cream (KENALOG) 0.1 % Apply 1 application topically as needed.      ursodiol (ACTIGALL) 300 MG capsule Take 2 capsules (600 mg total) by mouth 2  (two) times daily. Office visit for further refills 360 capsule 0   No current facility-administered medications for this visit.    Allergies as of 04/27/2021 - Review Complete 04/27/2021  Allergen Reaction Noted   Allopurinol Rash 03/04/2012    Family History  Problem Relation Age of Onset   Liver cancer Mother        unknown primary origin   Lung cancer Mother        smoker   Colon cancer Neg Hx    Esophageal cancer Neg Hx    Stomach cancer Neg Hx    Rectal cancer Neg Hx     Social History   Socioeconomic History   Marital status: Married    Spouse name: Not on file   Number of children: 2   Years of education: Not on file   Highest education level: Not on file  Occupational History   Occupation: retired Academic librarian   Occupation: [employer]    Comment: works in [place] now    Fish farm manager: Retired  Tobacco Use   Smoking status: Never   Smokeless tobacco: Never  Scientific laboratory technician Use: Never used  Substance and Sexual Activity   Alcohol use: Yes    Comment: rarely drinks - 1 to 2 beers per month   Drug use: No   Sexual activity: Yes  Other Topics Concern   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   Activity: works farm to stay active, rabbit hunting   Diet: good water, fruits/vegetables daily   Social Determinants of Radio broadcast assistant Strain: Not on file  Food Insecurity: Not on file  Transportation Needs: Not on file  Physical Activity: Not on file  Stress: Not on file  Social Connections: Not on file  Intimate Partner Violence: Not on file    Review of Systems:    Constitutional: No weight loss, fever or chills Cardiovascular: No chest pain Respiratory: No SOB  Gastrointestinal: See HPI and otherwise negative   Physical Exam:  Vital signs: BP (!) 150/70   Pulse (!) 59   Ht 5\' 10"   (1.778 m)   Wt 200 lb (90.7 kg)   SpO2 97%   BMI 28.70 kg/m    Constitutional:   Pleasant Caucasian male appears to be in NAD, Well developed, Well nourished, alert and cooperative  Respiratory: Respirations even and unlabored. Lungs clear to auscultation bilaterally.   No wheezes, crackles, or rhonchi.  Cardiovascular: Normal S1, S2. No MRG. Regular rate  and rhythm. No peripheral edema, cyanosis or pallor.  Gastrointestinal:  Soft, nondistended, nontender. No rebound or guarding. Normal bowel sounds. No appreciable masses or hepatomegaly. Rectal:  Not performed.  Psychiatric: Demonstrates good judgement and reason without abnormal affect or behaviors.  RELEVANT LABS AND IMAGING: CBC    Component Value Date/Time   WBC 6.5 01/19/2021 0833   RBC 4.41 01/19/2021 0833   HGB 11.6 (L) 01/19/2021 0833   HCT 36.0 (L) 01/19/2021 0833   PLT 211.0 01/19/2021 0833   MCV 81.5 01/19/2021 0833   MCHC 32.3 01/19/2021 0833   RDW 15.4 01/19/2021 0833   LYMPHSABS 1.5 01/19/2021 0833   MONOABS 0.7 01/19/2021 0833   EOSABS 0.3 01/19/2021 0833   BASOSABS 0.0 01/19/2021 0833    CMP     Component Value Date/Time   NA 140 01/19/2021 0833   K 4.2 01/19/2021 0833   CL 105 01/19/2021 0833   CO2 26 01/19/2021 0833   GLUCOSE 87 01/19/2021 0833   BUN 21 01/19/2021 0833   CREATININE 1.60 (H) 01/19/2021 0833   CALCIUM 9.2 01/19/2021 0833   PROT 7.1 07/15/2020 1044   ALBUMIN 4.0 01/19/2021 0833   AST 15 07/15/2020 1044   ALT 16 07/15/2020 1044   ALKPHOS 100 07/15/2020 1044   BILITOT 0.8 07/15/2020 1044   GFRNONAA 45.05 04/14/2010 0811   GFRAA 72 11/03/2007 0838    Assessment: 1.  History of choledocholithiasis: Now maintained on Ursodiol 600 mg twice daily and doing well 2.  GERD: Controlled on Omeprazole 20 mg daily  Plan: 1.  Repeat hepatic function panel today to monitor liver enzymes given Ursodiol use 2.  Refilled Ursodiol 600 mg twice daily for 90 days #180 with 3 refills, this can be  refilled for another year after now without office visit 3.  Refilled Omeprazole 20 mg daily, 30-60 minutes for breakfast #90 with 3 refills, this can also be refilled for another year after it runs out 4.  Discussed Ursodiol's mechanism of action and answered patient's questions 5.  Discussed taking occasional Tums if he has breakthrough epigastric pain. 6.  Patient to follow in clinic with [place] in 2 years or sooner if he has any problems.  Christian Newer, PA-C [facility] 04/27/2021, 8:53 AM  Cc: Ria Bush, MD

## 2021-04-27 NOTE — Progress Notes (Signed)
Noted  

## 2021-05-26 ENCOUNTER — Other Ambulatory Visit: Payer: Self-pay | Admitting: Family Medicine

## 2021-07-06 ENCOUNTER — Other Ambulatory Visit: Payer: Self-pay | Admitting: Family Medicine

## 2021-07-06 DIAGNOSIS — E559 Vitamin D deficiency, unspecified: Secondary | ICD-10-CM

## 2021-07-06 DIAGNOSIS — E782 Mixed hyperlipidemia: Secondary | ICD-10-CM

## 2021-07-06 DIAGNOSIS — M1A9XX0 Chronic gout, unspecified, without tophus (tophi): Secondary | ICD-10-CM

## 2021-07-06 DIAGNOSIS — N1832 Chronic kidney disease, stage 3b: Secondary | ICD-10-CM

## 2021-07-21 ENCOUNTER — Other Ambulatory Visit (INDEPENDENT_AMBULATORY_CARE_PROVIDER_SITE_OTHER): Payer: Medicare PPO

## 2021-07-21 ENCOUNTER — Other Ambulatory Visit: Payer: Self-pay

## 2021-07-21 DIAGNOSIS — E782 Mixed hyperlipidemia: Secondary | ICD-10-CM | POA: Diagnosis not present

## 2021-07-21 DIAGNOSIS — M1A9XX0 Chronic gout, unspecified, without tophus (tophi): Secondary | ICD-10-CM | POA: Diagnosis not present

## 2021-07-21 DIAGNOSIS — E559 Vitamin D deficiency, unspecified: Secondary | ICD-10-CM | POA: Diagnosis not present

## 2021-07-21 DIAGNOSIS — N1832 Chronic kidney disease, stage 3b: Secondary | ICD-10-CM | POA: Diagnosis not present

## 2021-07-21 LAB — MICROALBUMIN / CREATININE URINE RATIO
Creatinine,U: 157.3 mg/dL
Microalb Creat Ratio: 0.4 mg/g (ref 0.0–30.0)
Microalb, Ur: <0.7 mg/dL (ref 0.0–1.9)

## 2021-07-21 LAB — CBC WITH DIFFERENTIAL/PLATELET
Basophils Absolute: 0 10*3/uL (ref 0.0–0.1)
Basophils Relative: 0.4 % (ref 0.0–3.0)
Eosinophils Absolute: 0.2 10*3/uL (ref 0.0–0.7)
Eosinophils Relative: 5 % (ref 0.0–5.0)
HCT: 33.8 % — ABNORMAL LOW (ref 39.0–52.0)
Hemoglobin: 11 g/dL — ABNORMAL LOW (ref 13.0–17.0)
Lymphocytes Relative: 28.9 % (ref 12.0–46.0)
Lymphs Abs: 1.4 10*3/uL (ref 0.7–4.0)
MCHC: 32.5 g/dL (ref 30.0–36.0)
MCV: 76.9 fl — ABNORMAL LOW (ref 78.0–100.0)
Monocytes Absolute: 0.6 10*3/uL (ref 0.1–1.0)
Monocytes Relative: 11.5 % (ref 3.0–12.0)
Neutro Abs: 2.6 10*3/uL (ref 1.4–7.7)
Neutrophils Relative %: 54.2 % (ref 43.0–77.0)
Platelets: 217 10*3/uL (ref 150.0–400.0)
RBC: 4.39 Mil/uL (ref 4.22–5.81)
RDW: 15.2 % (ref 11.5–15.5)
WBC: 4.8 10*3/uL (ref 4.0–10.5)

## 2021-07-21 LAB — COMPREHENSIVE METABOLIC PANEL
ALT: 14 U/L (ref 0–53)
AST: 14 U/L (ref 0–37)
Albumin: 4.2 g/dL (ref 3.5–5.2)
Alkaline Phosphatase: 92 U/L (ref 39–117)
BUN: 19 mg/dL (ref 6–23)
CO2: 32 mEq/L (ref 19–32)
Calcium: 9.2 mg/dL (ref 8.4–10.5)
Chloride: 106 mEq/L (ref 96–112)
Creatinine, Ser: 1.69 mg/dL — ABNORMAL HIGH (ref 0.40–1.50)
GFR: 39.05 mL/min — ABNORMAL LOW (ref >60.00)
Glucose, Bld: 96 mg/dL (ref 70–99)
Potassium: 4.4 mEq/L (ref 3.5–5.1)
Sodium: 140 mEq/L (ref 135–145)
Total Bilirubin: 0.6 mg/dL (ref 0.2–1.2)
Total Protein: 6.7 g/dL (ref 6.0–8.3)

## 2021-07-21 LAB — LIPID PANEL
Cholesterol: 120 mg/dL (ref 0–200)
HDL: 31.7 mg/dL — ABNORMAL LOW (ref >39.00)
NonHDL: 88.76
Total CHOL/HDL Ratio: 4
Triglycerides: 248 mg/dL — ABNORMAL HIGH (ref 0.0–149.0)
VLDL: 49.6 mg/dL — ABNORMAL HIGH (ref 0.0–40.0)

## 2021-07-21 LAB — URIC ACID: Uric Acid, Serum: 5.1 mg/dL (ref 4.0–7.8)

## 2021-07-21 LAB — LDL CHOLESTEROL, DIRECT: Direct LDL: 59 mg/dL

## 2021-07-21 LAB — VITAMIN D 25 HYDROXY (VIT D DEFICIENCY, FRACTURES): VITD: 51.79 ng/mL (ref 30.00–100.00)

## 2021-07-28 ENCOUNTER — Encounter: Payer: Self-pay | Admitting: Family Medicine

## 2021-07-28 ENCOUNTER — Other Ambulatory Visit: Payer: Self-pay

## 2021-07-28 ENCOUNTER — Ambulatory Visit (INDEPENDENT_AMBULATORY_CARE_PROVIDER_SITE_OTHER)
Admission: RE | Admit: 2021-07-28 | Discharge: 2021-07-28 | Disposition: A | Discharge status: Home / Self Care | Payer: Medicare PPO | Source: Ambulatory Visit | Attending: Family Medicine | Admitting: Family Medicine

## 2021-07-28 ENCOUNTER — Ambulatory Visit (INDEPENDENT_AMBULATORY_CARE_PROVIDER_SITE_OTHER): Payer: Medicare PPO | Admitting: Family Medicine

## 2021-07-28 VITALS — BP 140/72 | HR 57 | Temp 97.3°F | Ht 69.0 in | Wt 199.1 lb

## 2021-07-28 DIAGNOSIS — Z7189 Other specified counseling: Secondary | ICD-10-CM

## 2021-07-28 DIAGNOSIS — Z0000 Encounter for general adult medical examination without abnormal findings: Secondary | ICD-10-CM | POA: Diagnosis not present

## 2021-07-28 DIAGNOSIS — G8929 Other chronic pain: Secondary | ICD-10-CM

## 2021-07-28 DIAGNOSIS — M1A9XX0 Chronic gout, unspecified, without tophus (tophi): Secondary | ICD-10-CM

## 2021-07-28 DIAGNOSIS — M5442 Lumbago with sciatica, left side: Secondary | ICD-10-CM

## 2021-07-28 DIAGNOSIS — E559 Vitamin D deficiency, unspecified: Secondary | ICD-10-CM | POA: Diagnosis not present

## 2021-07-28 DIAGNOSIS — N1832 Chronic kidney disease, stage 3b: Secondary | ICD-10-CM

## 2021-07-28 DIAGNOSIS — M545 Low back pain, unspecified: Secondary | ICD-10-CM | POA: Diagnosis not present

## 2021-07-28 DIAGNOSIS — I2510 Atherosclerotic heart disease of native coronary artery without angina pectoris: Secondary | ICD-10-CM

## 2021-07-28 DIAGNOSIS — K219 Gastro-esophageal reflux disease without esophagitis: Secondary | ICD-10-CM

## 2021-07-28 DIAGNOSIS — E782 Mixed hyperlipidemia: Secondary | ICD-10-CM

## 2021-07-28 DIAGNOSIS — I10 Essential (primary) hypertension: Secondary | ICD-10-CM | POA: Diagnosis not present

## 2021-07-28 MED ORDER — ACETAMINOPHEN 500 MG PO TABS: 500.0000 mg | ORAL_TABLET | Freq: Four times a day (QID) | ORAL | Status: AC | PRN

## 2021-07-28 NOTE — Assessment & Plan Note (Signed)
Advanced directives:  Would want wife to be HCPOA. Has living will at home. [place] with life support if possibility of reversal but doesn't want prolonged life support if terminal condition. Asked to bring us a copy.  

## 2021-07-28 NOTE — Assessment & Plan Note (Signed)

## 2021-07-28 NOTE — Assessment & Plan Note (Signed)
Stable period on probenecid 250mg  BID with rare colchicine use.  ?

## 2021-07-28 NOTE — Progress Notes (Signed)
Patient ID: Christian Kim, male    DOB: May 30, 1945, 76 y.o.   MRN: [id number]  This visit was conducted in person.  BP 140/72    Pulse (!) 57    Temp (!) 97.3 F (36.3 C) (Temporal)    Ht 5\' 9"  (1.753 m)    Wt 199 lb 2 oz (90.3 kg)    SpO2 96%    BMI 29.41 kg/m    CC: CPE/AMW Subjective:   HPI: Christian Kim is a 76 y.o. male presenting on 07/28/2021 for Medicare Wellness   Did not see health advisor this year.   Hearing Screening   500Hz  1000Hz  2000Hz  4000Hz   Right ear 20 20 20 20   Left ear 20 20 40 0  Vision Screening - Comments:: Last eye exam, 06/2020.  Christian Kim Office Visit from 07/28/2021 in [facility]  PHQ-2 Total Score 0       Fall Risk  07/28/2021 07/22/2020 07/21/2019 07/08/2018 06/28/2017  Falls in the past year? 0 - 1 1 Yes  Comment - - - tripped over gate in yard; no injury accidental fall from tripping while on farm  Number falls in past yr: - 0 0 0 1  Comment - Tipped over dog - - -  Injury with Fall? - 0 0 1 No    Chronic ursodiol use 2/2 recurrent choledocholithiasis after cholecystectomy.  CAD - sees cardiology yearly - good report. 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   Notes lower back pain for last several months. Denies inciting trauma/injury or falls. Pain can shoot down left leg, without numbness or weakness. Manages with tylenol. No bowel/bladder accidents. No fevers.   Preventative: COLONOSCOPY Date: 06/2013 1 SSP, rpt 5 yrs [name]).  Colonoscopy 11/2019 - 1 TA, int hem, no f/u needed [name]).  Prostate cancer screening - no problems, aged out. Denies nocturia or LUTS sxs.  Lung cancer screening - not eligible Flu shot yearly.  COVID vaccine Pfizer 06/2019, 07/2019, booster 04/2020 Pneumovax 2012, prevnar-13 05/2014 Tdap 05/2015  Zostavax 2012 - rash after he got shot.  Shingrix - discussed, not interested  Advanced directives:  Would want wife to be HCPOA. Has living will at home. [place] with life  support if possibility of reversal but doesn't want prolonged life support if terminal condition. Asked to bring [place] a copy.  Seat belt use discussed.  Sunscreen use discussed. No changing moles on skin.  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 Activity: works farm to stay active, deer and rabbit hunting  Diet: good water, fruits/vegetables daily      Relevant past medical, surgical, family and social history reviewed and updated as indicated. Interim medical history since our last visit reviewed. Allergies and medications reviewed and updated. Outpatient Medications Prior to Visit  Medication Sig Dispense Refill   amLODipine (NORVASC) 10 MG tablet TAKE 1 TABLET BY MOUTH ONCE A DAY 90 tablet 0   aspirin 81 MG tablet Take 81 mg by mouth daily.     atorvastatin (LIPITOR) 20 MG tablet TAKE 1 TABLET BY MOUTH ONCE A DAY 90 tablet 0   Cholecalciferol (VITAMIN D3) 25 MCG (1000 UT) CAPS Take 1 capsule (1,000 Units total) by mouth  daily. 30 capsule    lisinopril (ZESTRIL) 40 MG tablet TAKE 1 TABLET BY MOUTH ONCE A DAY 90 tablet 0   metoprolol succinate (TOPROL-XL) 25 MG 24 hr tablet TAKE 1 TABLET BY MOUTH ONCE A DAY 90 tablet 0   MITIGARE 0.6 MG CAPS Take 1 capsule by mouth daily as needed (gout flare). Take 2 capsules on the first day of flare 30 capsule 3   probenecid (BENEMID) 500 MG tablet TAKE 1/2 A TABLET (250 MG TOTAL) BY MOUTH 2 TIMES DAILY. 30 tablet 11   triamcinolone cream (KENALOG) 0.1 % Apply 1 application topically as needed.      ursodiol (ACTIGALL) 300 MG capsule Take 2 capsules (600 mg total) by mouth 2 (two) times daily. 360 capsule 3   ibuprofen (ADVIL,MOTRIN) 200 MG tablet Take 200 mg by mouth every 6 (six) hours as needed for pain.     omeprazole (PRILOSEC) 20 MG  capsule Take 1 capsule (20 mg total) by mouth daily. 90 capsule 3   omeprazole (PRILOSEC) 20 MG capsule Take 1 capsule (20 mg total) by mouth every other day.     No facility-administered medications prior to visit.     Per HPI unless specifically indicated in ROS section below Review of Systems  Constitutional:  Negative for activity change, appetite change, chills, fatigue, fever and unexpected weight change.  HENT:  Negative for hearing loss.   Eyes:  Negative for visual disturbance.  Respiratory:  Negative for cough, chest tightness, shortness of breath and wheezing.   Cardiovascular:  Negative for chest pain, palpitations and leg swelling.  Gastrointestinal:  Negative for abdominal distention, abdominal pain, blood in stool, constipation, diarrhea, nausea and vomiting.  Genitourinary:  Negative for difficulty urinating and hematuria.  Musculoskeletal:  Negative for arthralgias, myalgias and neck pain.  Skin:  Negative for rash.  Neurological:  Negative for dizziness, seizures, syncope and headaches.  Hematological:  Negative for adenopathy. Does not bruise/bleed easily.  Psychiatric/Behavioral:  Negative for dysphoric mood. The patient is not nervous/anxious.    Objective:  BP 140/72    Pulse (!) 57    Temp (!) 97.3 F (36.3 C) (Temporal)    Ht 5\' 9"  (1.753 m)    Wt 199 lb 2 oz (90.3 kg)    SpO2 96%    BMI 29.41 kg/m   Wt Readings from Last 3 Encounters:  07/28/21 199 lb 2 oz (90.3 kg)  04/27/21 200 lb (90.7 kg)  12/07/20 197 lb 6.4 oz (89.5 kg)      Physical Exam Vitals and nursing note reviewed.  Constitutional:      General: He is not in acute distress.    Appearance: Normal appearance. He is well-developed. He is not ill-appearing.  HENT:     Head: Normocephalic and atraumatic.     Right Ear: Hearing, tympanic membrane, ear canal and external ear normal.     Left Ear: Hearing, tympanic membrane, ear canal and external ear normal.  Eyes:     General: No scleral  icterus.    Extraocular Movements: Extraocular movements intact.     Conjunctiva/sclera: Conjunctivae normal.     Pupils: Pupils are equal, round, and reactive to light.  Neck:     Thyroid: No thyroid mass or thyromegaly.  Cardiovascular:     Rate and Rhythm: Normal rate and regular rhythm.     Pulses: Normal pulses.          Radial pulses are 2+ on the right side and 2+ on  the left side.     Heart sounds: Normal heart sounds. No murmur heard. Pulmonary:     Effort: Pulmonary effort is normal. No respiratory distress.     Breath sounds: Normal breath sounds. No wheezing, rhonchi or rales.  Abdominal:     General: Bowel sounds are normal. There is no distension.     Palpations: Abdomen is soft. There is no mass.     Tenderness: There is no abdominal tenderness. There is no guarding or rebound.     Hernia: No hernia is present.  Musculoskeletal:        General: Normal range of motion.     Cervical back: Normal range of motion and neck supple.     Right lower leg: No edema.     Left lower leg: No edema.     Comments:  No significant pain midline spine No paraspinous mm tenderness Neg seated SLR on left.  Lymphadenopathy:     Cervical: No cervical adenopathy.  Skin:    General: Skin is warm and dry.     Findings: No rash.  Neurological:     General: No focal deficit present.     Mental Status: He is alert and oriented to person, place, and time.     Sensory: Sensation is intact.     Motor: Motor function is intact.     Coordination: Coordination is intact.     Gait: Gait is intact.     Deep Tendon Reflexes:     Reflex Scores:      Patellar reflexes are 2+ on the right side and 2+ on the left side.      Achilles reflexes are 2+ on the right side and 2+ on the left side.    Comments:  5/5 strength BLE Sensation intact Recall 3/3 Calculation 5/5 DLROW  Psychiatric:        Mood and Affect: Mood normal.        Behavior: Behavior normal.        Thought Content: Thought  content normal.        Judgment: Judgment normal.      Results for orders placed or performed in visit on 07/21/21  Microalbumin / creatinine urine ratio  Result Value Ref Range   Microalb, Ur <0.7 0.0 - 1.9 mg/dL   Creatinine,U 157.3 mg/dL   Microalb Creat Ratio 0.4 0.0 - 30.0 mg/g  Uric acid  Result Value Ref Range   Uric Acid, Serum 5.1 4.0 - 7.8 mg/dL  CBC with Differential/Platelet  Result Value Ref Range   WBC 4.8 4.0 - 10.5 K/uL   RBC 4.39 4.22 - 5.81 Mil/uL   Hemoglobin 11.0 (L) 13.0 - 17.0 g/dL   HCT 33.8 (L) 39.0 - 52.0 %   MCV 76.9 (L) 78.0 - 100.0 fl   MCHC 32.5 30.0 - 36.0 g/dL   RDW 15.2 11.5 - 15.5 %   Platelets 217.0 150.0 - 400.0 K/uL   Neutrophils Relative % 54.2 43.0 - 77.0 %   Lymphocytes Relative 28.9 12.0 - 46.0 %   Monocytes Relative 11.5 3.0 - 12.0 %   Eosinophils Relative 5.0 0.0 - 5.0 %   Basophils Relative 0.4 0.0 - 3.0 %   Neutro Abs 2.6 1.4 - 7.7 K/uL   Lymphs Abs 1.4 0.7 - 4.0 K/uL   Monocytes Absolute 0.6 0.1 - 1.0 K/uL   Eosinophils Absolute 0.2 0.0 - 0.7 K/uL   Basophils Absolute 0.0 0.0 - 0.1 K/uL  Comprehensive metabolic panel  Result Value Ref Range   Sodium 140 135 - 145 mEq/L   Potassium 4.4 3.5 - 5.1 mEq/L   Chloride 106 96 - 112 mEq/L   CO2 32 19 - 32 mEq/L   Glucose, Bld 96 70 - 99 mg/dL   BUN 19 6 - 23 mg/dL   Creatinine, Ser 1.69 (H) 0.40 - 1.50 mg/dL   Total Bilirubin 0.6 0.2 - 1.2 mg/dL   Alkaline Phosphatase 92 39 - 117 U/L   AST 14 0 - 37 U/L   ALT 14 0 - 53 U/L   Total Protein 6.7 6.0 - 8.3 g/dL   Albumin 4.2 3.5 - 5.2 g/dL   GFR 39.05 (L) >60.00 mL/min   Calcium 9.2 8.4 - 10.5 mg/dL  Lipid panel  Result Value Ref Range   Cholesterol 120 0 - 200 mg/dL   Triglycerides 248.0 (H) 0.0 - 149.0 mg/dL   HDL 31.70 (L) >39.00 mg/dL   VLDL 49.6 (H) 0.0 - 40.0 mg/dL   Total CHOL/HDL Ratio 4    NonHDL 88.76   VITAMIN D 25 Hydroxy (Vit-D Deficiency, Fractures)  Result Value Ref Range   VITD 51.79 30.00 - 100.00 ng/mL  LDL  cholesterol, direct  Result Value Ref Range   Direct LDL 59.0 mg/dL    Assessment & Plan:  This visit occurred during the SARS-CoV-2 public health emergency.  Safety protocols were in place, including screening questions prior to the visit, additional usage of staff PPE, and extensive cleaning of exam room while observing appropriate contact time as indicated for disinfecting solutions.   Problem List Items Addressed This Visit     Medicare annual wellness visit, subsequent - Primary (Chronic)    I have personally reviewed the Medicare Annual Wellness questionnaire and have noted 1. The patient's medical and social history 2. Their use of alcohol, tobacco or illicit drugs 3. Their current medications and supplements 4. The patient's functional ability including ADL's, fall risks, home safety risks and hearing or visual impairment. Cognitive function has been assessed and addressed as indicated.  5. Diet and physical activity 6. Evidence for depression or mood disorders The patients weight, height, BMI have been recorded in the chart. I have made referrals, counseling and provided education to the patient based on review of the above and I have provided the pt with a written personalized care plan for preventive services. Provider list updated.. See scanned questionairre as needed for further documentation. Reviewed preventative protocols and updated unless pt declined.       Advanced care planning/counseling discussion (Chronic)    Advanced directives:  Would want wife to be HCPOA. Has living will at home. [place] with life support if possibility of reversal but doesn't want prolonged life support if terminal condition. Asked to bring [place] a copy.       Health maintenance examination (Chronic)    Preventative protocols reviewed and updated unless pt declined. Discussed healthy diet and lifestyle.       HLD (hyperlipidemia)    Chronic, LDL at goal on atorvastatin. Also continues urosdiol  as per below. Triglycerides remain high. Encouraged increasing fatty fish in diet.  The ASCVD Risk score (Arnett DK, et al., 2019) failed to calculate for the following reasons:   The valid total cholesterol range is 130 to 320 mg/dL       Essential hypertension    Chronic, stable. Continue current regimen.       Coronary atherosclerosis    Appreciate cardiology care.  Chronic kidney disease, stage 3b ([place])    CKD dates back to 10+ yrs ago.  Anticipate developing anemia of CKD.  Continue to monitor closely. Recommend he drop PPI to QOD - try this lower dose       Gout    Stable period on probenecid 250mg  BID with rare colchicine use.       Vitamin D deficiency    Continue vit D 1000 IU daily with good repletion of levels.       Gastroesophageal reflux disease without esophagitis    Discussed PPI use - rec trial QOD dosing in CKD       Relevant Medications   omeprazole (PRILOSEC) 20 MG capsule   Chronic left-sided low back pain with left-sided sciatica    Overall benign exam.  Discussed tylenol use, gentle stretching, provided with exercises from SM pt avisor.  Given chronicity (ongoing for 2+ months), check lumbar films.       Relevant Medications   acetaminophen (TYLENOL) 500 MG tablet   Other Relevant Orders   DG Lumbar Spine Complete     Meds ordered this encounter  Medications   acetaminophen (TYLENOL) 500 MG tablet    Sig: Take 1 tablet (500 mg total) by mouth every 6 (six) hours as needed.   Orders Placed This Encounter  Procedures   DG Lumbar Spine Complete    Standing Status:   Future    Number of Occurrences:   1    Standing Expiration Date:   07/29/2022    Order Specific Question:   Reason for Exam (SYMPTOM  OR DIAGNOSIS REQUIRED)    Answer:   low back pain with L sciatica for months    Order Specific Question:   Preferred imaging location?    Answer:   [facility]     Patient instructions: Return in 6 months for lab visit only to  recheck anemia and kidneys If interested, check with pharmacy about new 2 shot shingles series (shingrix).  Bring [place] a copy of your living will to update your chart.  Try taking prilosec 20mg  every other day instead of daily. If heartburn symptoms develop, may increase to daily dose.  Low back xrays today.  Good to see you today Return as needed or in 1 year for next physical.   Follow up plan: Return in about 1 year (around 07/29/2022) for annual exam, prior fasting for blood work.  Ria Bush, MD

## 2021-07-28 NOTE — Assessment & Plan Note (Signed)
Chronic, stable. Continue current regimen. 

## 2021-07-28 NOTE — Assessment & Plan Note (Signed)
Chronic, LDL at goal on atorvastatin. Also continues urosdiol as per below. Triglycerides remain high. Encouraged increasing fatty fish in diet.  ?The ASCVD Risk score (Arnett DK, et al., 2019) failed to calculate for the following reasons: ?  The valid total cholesterol range is 130 to 320 mg/dL  ?

## 2021-07-28 NOTE — Assessment & Plan Note (Signed)
Overall benign exam.  ?Discussed tylenol use, gentle stretching, provided with exercises from SM pt avisor.  ?Given chronicity (ongoing for 2+ months), check lumbar films.  ?

## 2021-07-28 NOTE — Patient Instructions (Addendum)
Return in 6 months for lab visit only to recheck anemia and kidneys ?If interested, check with pharmacy about new 2 shot shingles series (shingrix).  ?Bring [place] a copy of your living will to update your chart.  ?Try taking prilosec 20mg  every other day instead of daily. If heartburn symptoms develop, may increase to daily dose.  ?Low back xrays today.  ?Good to see you today ?Return as needed or in 1 year for next physical.  ? ?Health Maintenance After Age 59 ?After age 32, you are at a higher risk for certain long-term diseases and infections as well as injuries from falls. Falls are a major cause of broken bones and head injuries in people who are older than age 79. Getting regular preventive care can help to keep you healthy and well. Preventive care includes getting regular testing and making lifestyle changes as recommended by your health care provider. Talk with your health care provider about: ?Which screenings and tests you should have. A screening is a test that checks for a disease when you have no symptoms. ?A diet and exercise plan that is right for you. ?What should I know about screenings and tests to prevent falls? ?Screening and testing are the best ways to find a health problem early. Early diagnosis and treatment give you the best chance of managing medical conditions that are common after age 50. Certain conditions and lifestyle choices may make you more likely to have a fall. Your health care provider may recommend: ?Regular vision checks. Poor vision and conditions such as cataracts can make you more likely to have a fall. If you wear glasses, make sure to get your prescription updated if your vision changes. ?Medicine review. Work with your health care provider to regularly review all of the medicines you are taking, including over-the-counter medicines. Ask your health care provider about any side effects that may make you more likely to have a fall. Tell your health care provider if any  medicines that you take make you feel dizzy or sleepy. ?Strength and balance checks. Your health care provider may recommend certain tests to check your strength and balance while standing, walking, or changing positions. ?Foot health exam. Foot pain and numbness, as well as not wearing proper footwear, can make you more likely to have a fall. ?Screenings, including: ?Osteoporosis screening. Osteoporosis is a condition that causes the bones to get weaker and break more easily. ?Blood pressure screening. Blood pressure changes and medicines to control blood pressure can make you feel dizzy. ?Depression screening. You may be more likely to have a fall if you have a fear of falling, feel depressed, or feel unable to do activities that you used to do. ?Alcohol use screening. Using too much alcohol can affect your balance and may make you more likely to have a fall. ?Follow these instructions at home: ?Lifestyle ?Do not drink alcohol if: ?Your health care provider tells you not to drink. ?If you drink alcohol: ?Limit how much you have to: ?0-1 drink a day for women. ?0-2 drinks a day for men. ?Know how much alcohol is in your drink. In the [place] one drink equals one 12 oz bottle of beer (355 mL), one 5 oz glass of wine (148 mL), or one 1? oz glass of hard liquor (44 mL). ?Do not use any products that contain nicotine or tobacco. These products include cigarettes, chewing tobacco, and vaping devices, such as e-cigarettes. If you need help quitting, ask your health care provider. ?Activity ? ?  Follow a regular exercise program to stay fit. This will help you maintain your balance. Ask your health care provider what types of exercise are appropriate for you. ?If you need a cane or walker, use it as recommended by your health care provider. ?Wear supportive shoes that have nonskid soles. ?Safety ? ?Remove any tripping hazards, such as rugs, cords, and clutter. ?Install safety equipment such as grab bars in bathrooms and  safety rails on stairs. ?Keep rooms and walkways well-lit. ?General instructions ?Talk with your health care provider about your risks for falling. Tell your health care provider if: ?You fall. Be sure to tell your health care provider about all falls, even ones that seem minor. ?You feel dizzy, tiredness (fatigue), or off-balance. ?Take over-the-counter and prescription medicines only as told by your health care provider. These include supplements. ?Eat a healthy diet and maintain a healthy weight. A healthy diet includes low-fat dairy products, low-fat (lean) meats, and fiber from whole grains, beans, and lots of fruits and vegetables. ?Stay current with your vaccines. ?Schedule regular health, dental, and eye exams. ?Summary ?Having a healthy lifestyle and getting preventive care can help to protect your health and wellness after age 73. ?Screening and testing are the best way to find a health problem early and help you avoid having a fall. Early diagnosis and treatment give you the best chance for managing medical conditions that are more common for people who are older than age 7. ?Falls are a major cause of broken bones and head injuries in people who are older than age 41. Take precautions to prevent a fall at home. ?Work with your health care provider to learn what changes you can make to improve your health and wellness and to prevent falls. ?This information is not intended to replace advice given to you by your health care provider. Make sure you discuss any questions you have with your health care provider. ?Document Revised: 10/03/2020 Document Reviewed: 10/03/2020 ?Elsevier Patient Education ? [place]. ? ?

## 2021-07-28 NOTE — Assessment & Plan Note (Signed)
Preventative protocols reviewed and updated unless pt declined. Discussed healthy diet and lifestyle.  

## 2021-07-28 NOTE — Assessment & Plan Note (Signed)
Appreciate cardiology care.  °

## 2021-07-28 NOTE — Assessment & Plan Note (Signed)
CKD dates back to 10+ yrs ago.  ?Anticipate developing anemia of CKD.  ?Continue to monitor closely. ?Recommend he drop PPI to QOD - try this lower dose  ?

## 2021-07-28 NOTE — Assessment & Plan Note (Signed)
Discussed PPI use - rec trial QOD dosing in CKD  ?

## 2021-07-28 NOTE — Assessment & Plan Note (Signed)
Continue vit D 1000 IU daily with good repletion of levels.  ?

## 2021-09-22 ENCOUNTER — Other Ambulatory Visit: Payer: Self-pay | Admitting: Family Medicine

## 2021-09-25 NOTE — Telephone Encounter (Signed)
Refill request Lipitor ?Last refill 05/26/21 #90 ?Last office visit 07/28/21 ?See drug warning with [employer] ?

## 2021-09-25 NOTE — Telephone Encounter (Signed)
ERx 

## 2021-11-22 ENCOUNTER — Other Ambulatory Visit: Payer: Self-pay | Admitting: Family Medicine

## 2021-11-22 NOTE — Telephone Encounter (Signed)
Refill request probenecid Last refill 12/02/20 #30/11 Last office visit 07/28/21

## 2022-01-21 ENCOUNTER — Other Ambulatory Visit: Payer: Self-pay | Admitting: Family Medicine

## 2022-01-21 DIAGNOSIS — N1832 Chronic kidney disease, stage 3b: Secondary | ICD-10-CM

## 2022-01-21 DIAGNOSIS — D509 Iron deficiency anemia, unspecified: Secondary | ICD-10-CM

## 2022-01-30 ENCOUNTER — Other Ambulatory Visit (INDEPENDENT_AMBULATORY_CARE_PROVIDER_SITE_OTHER): Payer: Medicare PPO

## 2022-01-30 DIAGNOSIS — D509 Iron deficiency anemia, unspecified: Secondary | ICD-10-CM | POA: Diagnosis not present

## 2022-01-30 DIAGNOSIS — N1832 Chronic kidney disease, stage 3b: Secondary | ICD-10-CM

## 2022-01-30 LAB — CBC WITH DIFFERENTIAL/PLATELET
Basophils Absolute: 0 10*3/uL (ref 0.0–0.1)
Basophils Relative: 0.5 % (ref 0.0–3.0)
Eosinophils Absolute: 0.2 10*3/uL (ref 0.0–0.7)
Eosinophils Relative: 4.2 % (ref 0.0–5.0)
HCT: 36.6 % — ABNORMAL LOW (ref 39.0–52.0)
Hemoglobin: 11.9 g/dL — ABNORMAL LOW (ref 13.0–17.0)
Lymphocytes Relative: 21.6 % (ref 12.0–46.0)
Lymphs Abs: 1 10*3/uL (ref 0.7–4.0)
MCHC: 32.5 g/dL (ref 30.0–36.0)
MCV: 80.9 fl (ref 78.0–100.0)
Monocytes Absolute: 0.7 10*3/uL (ref 0.1–1.0)
Monocytes Relative: 14.5 % — ABNORMAL HIGH (ref 3.0–12.0)
Neutro Abs: 2.8 10*3/uL (ref 1.4–7.7)
Neutrophils Relative %: 59.2 % (ref 43.0–77.0)
Platelets: 160 10*3/uL (ref 150.0–400.0)
RBC: 4.52 Mil/uL (ref 4.22–5.81)
RDW: 17.7 % — ABNORMAL HIGH (ref 11.5–15.5)
WBC: 4.7 10*3/uL (ref 4.0–10.5)

## 2022-01-30 LAB — URINALYSIS, ROUTINE W REFLEX MICROSCOPIC
Bilirubin Urine: NEGATIVE
Hgb urine dipstick: NEGATIVE
Ketones, ur: NEGATIVE
Leukocytes,Ua: NEGATIVE
Nitrite: NEGATIVE
Specific Gravity, Urine: 1.025 (ref 1.000–1.030)
Total Protein, Urine: NEGATIVE
Urine Glucose: NEGATIVE
Urobilinogen, UA: 0.2 (ref 0.0–1.0)
pH: 6 (ref 5.0–8.0)

## 2022-01-30 LAB — IBC PANEL
Iron: 49 ug/dL (ref 42–165)
Saturation Ratios: 10.9 % — ABNORMAL LOW (ref 20.0–50.0)
TIBC: 450.8 ug/dL — ABNORMAL HIGH (ref 250.0–450.0)
Transferrin: 322 mg/dL (ref 212.0–360.0)

## 2022-01-30 LAB — RENAL FUNCTION PANEL
Albumin: 4.1 g/dL (ref 3.5–5.2)
BUN: 20 mg/dL (ref 6–23)
CO2: 25 mEq/L (ref 19–32)
Calcium: 9.1 mg/dL (ref 8.4–10.5)
Chloride: 107 mEq/L (ref 96–112)
Creatinine, Ser: 1.63 mg/dL — ABNORMAL HIGH (ref 0.40–1.50)
GFR: 40.63 mL/min — ABNORMAL LOW (ref >60.00)
Glucose, Bld: 94 mg/dL (ref 70–99)
Phosphorus: 4 mg/dL (ref 2.3–4.6)
Potassium: 4.1 mEq/L (ref 3.5–5.1)
Sodium: 142 mEq/L (ref 135–145)

## 2022-01-30 LAB — FERRITIN: Ferritin: 8.1 ng/mL — ABNORMAL LOW (ref 22.0–322.0)

## 2022-02-01 ENCOUNTER — Encounter: Payer: Self-pay | Admitting: Family Medicine

## 2022-02-01 ENCOUNTER — Other Ambulatory Visit: Payer: Self-pay | Admitting: Family Medicine

## 2022-02-01 DIAGNOSIS — D509 Iron deficiency anemia, unspecified: Secondary | ICD-10-CM

## 2022-02-01 MED ORDER — OMEPRAZOLE 20 MG PO CPDR: 20.0000 mg | DELAYED_RELEASE_CAPSULE | Freq: Every day | ORAL | Status: DC

## 2022-02-05 ENCOUNTER — Telehealth: Payer: Self-pay

## 2022-02-05 NOTE — Telephone Encounter (Signed)
Lvm asking pt to call back.  Need to relay results and Dr. Synthia Innocent message.  Labs/Dr. Synthia Innocent msg: Your kidney function remains impaired but overall stable.  Your anemia was improved, but iron levels and stores were low.  So, I'd like you to do a stool kit, which we have mailed to you. If positive, we may have you return to see Dr [name].   Have you had any upper abdominal pain or worsening reflux? I would recommend returning to daily omeprazole.   Your urine test returned ok.

## 2022-02-06 NOTE — Telephone Encounter (Addendum)
Recommend expedited office evaluation with myself or cardiology if this is similar to pain he had when he had heart attack.

## 2022-02-06 NOTE — Telephone Encounter (Signed)
Patient notified as instructed by telephone and verbalized understanding. Patient stated that he has not had any upper abdominal pain or worsening reflux. Patient stated the only pain that he has had is some right should pain. Patient stated that he had similar pain several years back and found out that he had a heart attack.Patient stated that he has a cardiologist and sees him once a year. Patient denies any chest pain or SO at this time. Patient stated that the pain comes and goes for the past several months. Patient stated at times the pain level is 4-5.  Patient was given ER precautions and he verbalized understanding.

## 2022-02-06 NOTE — Telephone Encounter (Signed)
Patient notified as instructed by telephone and verbalized understanding. Patient scheduled for an office visit with Dr. Danise Mina tomorrow 02/07/22 at 12:00 noon. Patient was given ER precautions and he verbalized understanding.

## 2022-02-07 ENCOUNTER — Ambulatory Visit: Payer: Medicare PPO | Admitting: Family Medicine

## 2022-02-07 ENCOUNTER — Telehealth: Payer: Self-pay | Admitting: Family Medicine

## 2022-02-07 ENCOUNTER — Encounter: Payer: Self-pay | Admitting: Family Medicine

## 2022-02-07 VITALS — BP 134/64 | HR 55 | Temp 97.4°F | Ht 69.0 in | Wt 192.4 lb

## 2022-02-07 DIAGNOSIS — E782 Mixed hyperlipidemia: Secondary | ICD-10-CM

## 2022-02-07 DIAGNOSIS — I208 Other forms of angina pectoris: Secondary | ICD-10-CM

## 2022-02-07 DIAGNOSIS — D509 Iron deficiency anemia, unspecified: Secondary | ICD-10-CM

## 2022-02-07 DIAGNOSIS — Z23 Encounter for immunization: Secondary | ICD-10-CM | POA: Diagnosis not present

## 2022-02-07 DIAGNOSIS — M25511 Pain in right shoulder: Secondary | ICD-10-CM | POA: Insufficient documentation

## 2022-02-07 DIAGNOSIS — I25119 Atherosclerotic heart disease of native coronary artery with unspecified angina pectoris: Secondary | ICD-10-CM | POA: Diagnosis not present

## 2022-02-07 LAB — LIPID PANEL
Cholesterol: 111 mg/dL (ref 0–200)
HDL: 27.1 mg/dL — ABNORMAL LOW (ref >39.00)
NonHDL: 83.76
Total CHOL/HDL Ratio: 4
Triglycerides: 235 mg/dL — ABNORMAL HIGH (ref 0.0–149.0)
VLDL: 47 mg/dL — ABNORMAL HIGH (ref 0.0–40.0)

## 2022-02-07 LAB — LDL CHOLESTEROL, DIRECT: Direct LDL: 62 mg/dL

## 2022-02-07 LAB — TROPONIN I (HIGH SENSITIVITY): High Sens Troponin I: 7 ng/L (ref 2–17)

## 2022-02-07 NOTE — Telephone Encounter (Signed)
Spoke with pt notifying him as soon as Dr. Darnell Level reviews his results, we will contact him.  Pt verbalizes understanding.

## 2022-02-07 NOTE — Patient Instructions (Addendum)
Flu shot today Labs today  EKG today Will expedite cardiology evaluation. No exertion until you see heart doctor. If any recurrent shoulder pain, call 911 or go straight to the ER.

## 2022-02-07 NOTE — Telephone Encounter (Signed)
Pt called in regarding test results . Would like a call back [phone]

## 2022-02-07 NOTE — Progress Notes (Signed)
Patient ID: Christian Kim, male    DOB: 07-20-1945, 76 y.o.   MRN: [id number]  This visit was conducted in person.  BP 134/64   Pulse (!) 55   Temp (!) 97.4 F (36.3 C) (Temporal)   Ht '5\' 9"'$  (1.753 m)   Wt 192 lb 6 oz (87.3 kg)   SpO2 96%   BMI 28.41 kg/m    CC: R shoulder pain Subjective:   HPI: Christian Kim is a 76 y.o. male presenting on 02/07/2022 for Shoulder Pain (C/o intermittent R shoulder throbbing pain.  Started several wks ago. Denies injury. )   Several weeks of throbbing R shoulder pain worse after working it. Points to anterior R shoulder as site of pain. Sitting and resting relievers pain. Last time he had pain was last night. No current pain. Denies inciting trauma/injury or fall.   No left shoulder pain, no chest pain, jaw pain, dyspnea, nausea.   Had similar R shoulder pain as anginal equivalent when he had NSTEMI with resultant stent placement (overlapping DES, PCI to RCA) 2008.   Continues amlodipine, aspirin, atorvastatin '20mg'$ , lisinopril, toprpol XL '25mg'$  daily.   Last saw cardioloyg 11/2020.      Relevant past medical, surgical, family and social history reviewed and updated as indicated. Interim medical history since our last visit reviewed. Allergies and medications reviewed and updated. Outpatient Medications Prior to Visit  Medication Sig Dispense Refill   acetaminophen (TYLENOL) 500 MG tablet Take 1 tablet (500 mg total) by mouth every 6 (six) hours as needed.     amLODipine (NORVASC) 10 MG tablet TAKE 1 TABLET BY MOUTH ONCE A DAY 90 tablet 2   aspirin 81 MG tablet Take 81 mg by mouth daily.     atorvastatin (LIPITOR) 20 MG tablet TAKE 1 TABLET BY MOUTH ONCE A DAY 90 tablet 3   Cholecalciferol (VITAMIN D3) 25 MCG (1000 UT) CAPS Take 1 capsule (1,000 Units total) by mouth daily. 30 capsule    lisinopril (ZESTRIL) 40 MG tablet TAKE 1 TABLET BY MOUTH ONCE A DAY 90 tablet 2   metoprolol succinate (TOPROL-XL) 25 MG 24 hr tablet TAKE 1 TABLET BY  MOUTH ONCE A DAY 90 tablet 2   MITIGARE 0.6 MG CAPS Take 1 capsule by mouth daily as needed (gout flare). Take 2 capsules on the first day of flare 30 capsule 3   omeprazole (PRILOSEC) 20 MG capsule Take 1 capsule (20 mg total) by mouth daily.     probenecid (BENEMID) 500 MG tablet TAKE 1/2 A TABLET (250 MG TOTAL) BY MOUTH 2 TIMES DAILY. 30 tablet 11   triamcinolone cream (KENALOG) 0.1 % Apply 1 application topically as needed.      ursodiol (ACTIGALL) 300 MG capsule Take 2 capsules (600 mg total) by mouth 2 (two) times daily. 360 capsule 3   No facility-administered medications prior to visit.     Per HPI unless specifically indicated in ROS section below Review of Systems  Objective:  BP 134/64   Pulse (!) 55   Temp (!) 97.4 F (36.3 C) (Temporal)   Ht '5\' 9"'$  (1.753 m)   Wt 192 lb 6 oz (87.3 kg)   SpO2 96%   BMI 28.41 kg/m   Wt Readings from Last 3 Encounters:  02/07/22 192 lb 6 oz (87.3 kg)  07/28/21 199 lb 2 oz (90.3 kg)  04/27/21 200 lb (90.7 kg)      Physical Exam Vitals and nursing note reviewed.  Constitutional:  Appearance: Normal appearance. He is not ill-appearing.  HENT:     Mouth/Throat:     Mouth: Mucous membranes are moist.     Pharynx: Oropharynx is clear. No oropharyngeal exudate or posterior oropharyngeal erythema.  Eyes:     Extraocular Movements: Extraocular movements intact.  Cardiovascular:     Rate and Rhythm: Normal rate and regular rhythm.     Pulses: Normal pulses.     Heart sounds: Normal heart sounds. No murmur heard. Pulmonary:     Effort: Pulmonary effort is normal. No respiratory distress.     Breath sounds: Normal breath sounds. No wheezing, rhonchi or rales.  Chest:     Chest wall: No tenderness.  Musculoskeletal:        General: No tenderness. Normal range of motion.     Right lower leg: No edema.     Left lower leg: No edema.     Comments: FROM bilateral shoulders flexion and abduction without pain to palpation of shoulder  landmarks  Skin:    General: Skin is warm and dry.     Findings: No rash.  Neurological:     Mental Status: He is alert.  Psychiatric:        Mood and Affect: Mood normal.        Behavior: Behavior normal.       Lab Results  Component Value Date   CREATININE 1.63 (H) 01/30/2022   BUN 20 01/30/2022   NA 142 01/30/2022   K 4.1 01/30/2022   CL 107 01/30/2022   CO2 25 01/30/2022    Lab Results  Component Value Date   WBC 4.7 01/30/2022   HGB 11.9 (L) 01/30/2022   HCT 36.6 (L) 01/30/2022   MCV 80.9 01/30/2022   PLT 160.0 01/30/2022    EKG - sinus bradycardia rate 50s, LAD, normal intervals, no hypertrophy or acute ST/T changes  Assessment & Plan:   Problem List Items Addressed This Visit     HLD (hyperlipidemia)   Relevant Orders   Lipid panel   Coronary atherosclerosis   Relevant Orders   Ambulatory referral to Cardiology   Troponin I (High Sensitivity)   Iron deficiency anemia    Pending iFOB.       Acute pain of right shoulder - Primary    Possible MSK cause however shoulder exam reassuring today and this presents similar to his previous anginal equivalent when found to have NSTEMI. See below.       Relevant Orders   EKG 12-Lead (Completed)   Ambulatory referral to Cardiology   Troponin I (High Sensitivity)   Anginal equivalent ([place])    Similar symptoms to 2008 when he had NSTEMI.  Check EKG today.  Check TnI, update FLP, refer urgently back to cardiology.  No current chest pain, looks well.  Discussed ER precautions.       Relevant Orders   Ambulatory referral to Cardiology   Troponin I (High Sensitivity)   Other Visit Diagnoses     Need for influenza vaccination       Relevant Orders   Flu Vaccine QUAD High Dose(Fluad) (Completed)        No orders of the defined types were placed in this encounter.  Orders Placed This Encounter  Procedures   Flu Vaccine QUAD High Dose(Fluad)   Lipid panel   Ambulatory referral to Cardiology     Referral Priority:   Urgent    Referral Type:   Consultation    Referral Reason:   Specialty  Services Required    Requested Specialty:   Cardiology    Number of Visits Requested:   1   EKG 12-Lead     Patient Instructions  Flu shot today Labs today  EKG today Will expedite cardiology evaluation. No exertion until you see heart doctor. If any recurrent shoulder pain, call 911 or go straight to the ER.   Follow up plan: Return if symptoms worsen or fail to improve.  Ria Bush, MD

## 2022-02-07 NOTE — Assessment & Plan Note (Signed)
Pending iFOB.

## 2022-02-07 NOTE — Assessment & Plan Note (Signed)
Similar symptoms to 2008 when he had NSTEMI.  Check EKG today.  Check TnI, update FLP, refer urgently back to cardiology.  No current chest pain, looks well.  Discussed ER precautions.

## 2022-02-07 NOTE — Assessment & Plan Note (Signed)
Possible MSK cause however shoulder exam reassuring today and this presents similar to his previous anginal equivalent when found to have NSTEMI. See below.

## 2022-02-08 ENCOUNTER — Other Ambulatory Visit: Payer: Self-pay | Admitting: Radiology

## 2022-02-08 DIAGNOSIS — D509 Iron deficiency anemia, unspecified: Secondary | ICD-10-CM

## 2022-02-09 LAB — FECAL OCCULT BLOOD, IMMUNOCHEMICAL: Fecal Occult Bld: NEGATIVE

## 2022-02-12 NOTE — Progress Notes (Unsigned)
Cardiology Office Note:    Date:  02/13/2022   ID:  Dierdre Searles, DOB 31-Jan-1946, MRN [id number]  PCP:  Ria Bush, [place] [facility] Cardiologist:  Sherren Mocha, MD    Referring MD: Ria Bush, MD   Chief Complaint:  R Shoulder Pain    Patient Profile: Coronary artery disease  NSTEMI in 12/2006 s/p complex PCI of LCx w overlapping DES Staged PCI 3 x 15 mm Promus DES to RCA LAD 60 >> managed med Stress Echocardiogram 2018: normal Hypertension  Hyperlipidemia  Chronic kidney disease  GERD s/p cholecystectomy  Choledocholithiasis s/p multiple ERCPs w stone extraction, sphincterotomy   Prior CV Studies: ECHOCARDIOGRAM STRESS TEST 10/17/16 Impressions:  - Negative stress echo. NO evidence of ischemia .     History of Present Illness:   AMITAI DELAUGHTER is a 76 y.o. male with the above problem list.  He was last seen in clinic by Ermalinda Barrios, PA-C in 11/2020. He was recently seen by Dr. Danise Mina, his PCP, on 02/07/22 with R shoulder pain that was similar to his anginal equivalent. A hs-Trop was checked and it was neg at 7. An EKG was obtained which was personally reviewed and interpreted by me. It demonstrated sinus rhythm and no acute ST-TW changes. He is seen for further evaluation.   He is here alone today. He notes R shoulder pain described as throbbing for the past 1 month. He had these symptoms leading up to his MI in 2008. His pain will come on with exertion. It resolves with rest. He has not been short of breath. He has not had orthopnea, syncope, leg edema.      Past Medical History:  Diagnosis Date   CAD (coronary artery disease) 2008   stent   CAP (community acquired pneumonia) 03/07/2015   Choledocholithiasis    recurrent/multiple ERCPs with stone extraction and sphincterotomy    CHOLEDOCHOLITHIASIS 09/20/2007   Qualifier: Diagnosis of  By: Roxan Hockey, [place]     GERD (gastroesophageal reflux disease)    Gout 2013   dx  by crystal analysis   Hyperlipidemia    Hypertension    Myocardial infarction [facility]) 12-2006   Prostatitis 01/22/2017   Current Medications: Current Meds  Medication Sig   acetaminophen (TYLENOL) 500 MG tablet Take 1 tablet (500 mg total) by mouth every 6 (six) hours as needed.   amLODipine (NORVASC) 10 MG tablet TAKE 1 TABLET BY MOUTH ONCE A DAY   aspirin 81 MG tablet Take 81 mg by mouth daily.   atorvastatin (LIPITOR) 20 MG tablet TAKE 1 TABLET BY MOUTH ONCE A DAY   Cholecalciferol (VITAMIN D3) 25 MCG (1000 UT) CAPS Take 1 capsule (1,000 Units total) by mouth daily.   isosorbide mononitrate (IMDUR) 30 MG 24 hr tablet Take 0.5 tablets (15 mg total) by mouth daily.   lisinopril (ZESTRIL) 40 MG tablet TAKE 1 TABLET BY MOUTH ONCE A DAY   metoprolol succinate (TOPROL-XL) 25 MG 24 hr tablet TAKE 1 TABLET BY MOUTH ONCE A DAY   MITIGARE 0.6 MG CAPS Take 1 capsule by mouth daily as needed (gout flare). Take 2 capsules on the first day of flare   nitroGLYCERIN (NITROSTAT) 0.4 MG SL tablet Place 1 tablet (0.4 mg total) under the tongue every 5 (five) minutes as needed for chest pain.   omeprazole (PRILOSEC) 20 MG capsule Take 1 capsule (20 mg total) by mouth daily.   probenecid (BENEMID) 500 MG tablet TAKE 1/2 A TABLET (250  MG TOTAL) BY MOUTH 2 TIMES DAILY.   triamcinolone cream (KENALOG) 0.1 % Apply 1 application topically as needed.    ursodiol (ACTIGALL) 300 MG capsule Take 2 capsules (600 mg total) by mouth 2 (two) times daily.    Allergies:   Allopurinol   Social History   Tobacco Use   Smoking status: Never   Smokeless tobacco: Never  Vaping Use   Vaping Use: Never used  Substance Use Topics   Alcohol use: Yes    Comment: rarely drinks - 1 to 2 beers per month   Drug use: No    Family Hx: The patient's family history includes Liver cancer in his mother; Lung cancer in his mother. There is no history of Colon cancer, Esophageal cancer, Stomach cancer, or Rectal cancer.  Review of  Systems  Constitutional: Negative for fever.  Cardiovascular:  Negative for claudication.  Respiratory:  Negative for cough.   Gastrointestinal:  Negative for hematochezia.  Genitourinary:  Negative for hematuria.     EKGs/Labs/Other Test Reviewed:    EKG:  EKG is not ordered today.  The ekg ordered today demonstrates n/a  Recent Labs: 07/21/2021: ALT 14 01/30/2022: BUN 20; Creatinine, Ser 1.63; Hemoglobin 11.9; Platelets 160.0; Potassium 4.1; Sodium 142   Recent Lipid Panel Recent Labs    02/07/22 1248  CHOL 111  TRIG 235.0*  HDL 27.10*  VLDL 47.0*  LDLDIRECT 62.0     Risk Assessment/Calculations/Metrics:              Physical Exam:    VS:  BP 124/62   Pulse (!) 55   Ht '5\' 10"'$  (1.778 m)   Wt 194 lb 12.8 oz (88.4 kg)   SpO2 98%   BMI 27.95 kg/m     Wt Readings from Last 3 Encounters:  02/13/22 194 lb 12.8 oz (88.4 kg)  02/07/22 192 lb 6 oz (87.3 kg)  07/28/21 199 lb 2 oz (90.3 kg)    Constitutional:      Appearance: Healthy appearance. Not in distress.  Neck:     Vascular: No JVR. JVD normal.  Pulmonary:     Effort: Pulmonary effort is normal.     Breath sounds: No wheezing. No rales.  Cardiovascular:     Normal rate. Regular rhythm. Normal S1. Normal S2.      Murmurs: There is no murmur.  Edema:    Peripheral edema absent.  Abdominal:     Palpations: Abdomen is soft.  Skin:    General: Skin is warm and dry.  Neurological:     General: No focal deficit present.     Mental Status: Alert and oriented to person, place and time.          ASSESSMENT & PLAN:   Coronary artery disease involving native coronary artery with angina pectoris ([place]) Hx of NSTEMI in 12/2006 tx with overlapping DES to LCx and staged DES to RCA. His last stress test was a stress echocardiogram in 2018 and this was normal. He had R shoulder pain as his anginal equivalent in 2008. He has not felt this same pain again until now. He has pain with exertion and it resolves with rest. He  has not pain of his shoulder with palpation or return of pain with positional changes. His EKG last week showed no ST-TW changes and his hs-Trop was neg. However, I think he is describing angina. He is already on 2 antianginals. I think he needs to undergo cardiac catheterization. I reviewed his case with  Dr. Burt Knack, who agreed.  Patient does not take any PDE-5 inhibitors. Arrange cardiac catheterization with Dr. Burt Knack Continue amlodipine 10 mg daily, aspirin 81 mg daily, Lipitor 20 mg daily, Toprol-XL 25 mg daily Rx for as needed nitroglycerin Start isosorbide 15 mg daily He knows to go to the emergency room if his symptoms change or worsen.  Essential hypertension Blood pressure is well controlled.  Continue amlodipine 10 mg daily, Toprol-XL 25 mg daily, lisinopril 40 mg daily.  HLD (hyperlipidemia) LDL optimal on most recent labs.  Triglycerides are somewhat elevated.  Continue Lipitor 20 mg daily.  Consider adding Vascepa if triglycerides amine above 150.  Chronic kidney disease, stage 3b ([place]) Check precath labs today.  If GFR <45, he will need to come in early for hydration.        Shared Decision Making/Informed Consent The risks [stroke (1 in 1000), death (1 in 1000), kidney failure [usually temporary] (1 in 500), bleeding (1 in 200), allergic reaction [possibly serious] (1 in 200)], benefits (diagnostic support and management of coronary artery disease) and alternatives of a cardiac catheterization were discussed in detail with Mr. Hensen and he is willing to proceed.   Dispo:  Return for Post Procedure Follow Up, w/ [name], PA-C.   Medication Adjustments/Labs and Tests Ordered: Current medicines are reviewed at length with the patient today.  Concerns regarding medicines are outlined above.  Tests Ordered: Orders Placed This Encounter  Procedures   Basic metabolic panel   CBC   Medication Changes: Meds ordered this encounter  Medications   isosorbide mononitrate  (IMDUR) 30 MG 24 hr tablet    Sig: Take 0.5 tablets (15 mg total) by mouth daily.    Dispense:  45 tablet    Refill:  3   nitroGLYCERIN (NITROSTAT) 0.4 MG SL tablet    Sig: Place 1 tablet (0.4 mg total) under the tongue every 5 (five) minutes as needed for chest pain.    Dispense:  90 tablet    Refill:  3   Signed, [name], PA-C  02/13/2022 1:18 PM    [address], [place], [place]  [address] Phone: [phone]; Fax: [phone]

## 2022-02-12 NOTE — H&P (View-Only) (Signed)
Cardiology Office Note:    Date:  02/13/2022   ID:  Christian Kim, DOB 03/21/1946, MRN [id number]  PCP:  Ria Bush, [place] [facility] Cardiologist:  Sherren Mocha, MD    Referring MD: Ria Bush, MD   Chief Complaint:  R Shoulder Pain    Patient Profile: Coronary artery disease  NSTEMI in 12/2006 s/p complex PCI of LCx w overlapping DES Staged PCI 3 x 15 mm Promus DES to RCA LAD 60 >> managed med Stress Echocardiogram 2018: normal Hypertension  Hyperlipidemia  Chronic kidney disease  GERD s/p cholecystectomy  Choledocholithiasis s/p multiple ERCPs w stone extraction, sphincterotomy   Prior CV Studies: ECHOCARDIOGRAM STRESS TEST 10/17/16 Impressions:  - Negative stress echo. NO evidence of ischemia .     History of Present Illness:   Christian Kim is a 76 y.o. male with the above problem list.  He was last seen in clinic by Ermalinda Barrios, PA-C in 11/2020. He was recently seen by Dr. Danise Mina, his PCP, on 02/07/22 with R shoulder pain that was similar to his anginal equivalent. A hs-Trop was checked and it was neg at 7. An EKG was obtained which was personally reviewed and interpreted by me. It demonstrated sinus rhythm and no acute ST-TW changes. He is seen for further evaluation.   He is here alone today. He notes R shoulder pain described as throbbing for the past 1 month. He had these symptoms leading up to his MI in 2008. His pain will come on with exertion. It resolves with rest. He has not been short of breath. He has not had orthopnea, syncope, leg edema.      Past Medical History:  Diagnosis Date   CAD (coronary artery disease) 2008   stent   CAP (community acquired pneumonia) 03/07/2015   Choledocholithiasis    recurrent/multiple ERCPs with stone extraction and sphincterotomy    CHOLEDOCHOLITHIASIS 09/20/2007   Qualifier: Diagnosis of  By: Roxan Hockey, [place]     GERD (gastroesophageal reflux disease)    Gout 2013   dx  by crystal analysis   Hyperlipidemia    Hypertension    Myocardial infarction [facility]) 12-2006   Prostatitis 01/22/2017   Current Medications: Current Meds  Medication Sig   acetaminophen (TYLENOL) 500 MG tablet Take 1 tablet (500 mg total) by mouth every 6 (six) hours as needed.   amLODipine (NORVASC) 10 MG tablet TAKE 1 TABLET BY MOUTH ONCE A DAY   aspirin 81 MG tablet Take 81 mg by mouth daily.   atorvastatin (LIPITOR) 20 MG tablet TAKE 1 TABLET BY MOUTH ONCE A DAY   Cholecalciferol (VITAMIN D3) 25 MCG (1000 UT) CAPS Take 1 capsule (1,000 Units total) by mouth daily.   isosorbide mononitrate (IMDUR) 30 MG 24 hr tablet Take 0.5 tablets (15 mg total) by mouth daily.   lisinopril (ZESTRIL) 40 MG tablet TAKE 1 TABLET BY MOUTH ONCE A DAY   metoprolol succinate (TOPROL-XL) 25 MG 24 hr tablet TAKE 1 TABLET BY MOUTH ONCE A DAY   MITIGARE 0.6 MG CAPS Take 1 capsule by mouth daily as needed (gout flare). Take 2 capsules on the first day of flare   nitroGLYCERIN (NITROSTAT) 0.4 MG SL tablet Place 1 tablet (0.4 mg total) under the tongue every 5 (five) minutes as needed for chest pain.   omeprazole (PRILOSEC) 20 MG capsule Take 1 capsule (20 mg total) by mouth daily.   probenecid (BENEMID) 500 MG tablet TAKE 1/2 A TABLET (250  MG TOTAL) BY MOUTH 2 TIMES DAILY.   triamcinolone cream (KENALOG) 0.1 % Apply 1 application topically as needed.    ursodiol (ACTIGALL) 300 MG capsule Take 2 capsules (600 mg total) by mouth 2 (two) times daily.    Allergies:   Allopurinol   Social History   Tobacco Use   Smoking status: Never   Smokeless tobacco: Never  Vaping Use   Vaping Use: Never used  Substance Use Topics   Alcohol use: Yes    Comment: rarely drinks - 1 to 2 beers per month   Drug use: No    Family Hx: The patient's family history includes Liver cancer in his mother; Lung cancer in his mother. There is no history of Colon cancer, Esophageal cancer, Stomach cancer, or Rectal cancer.  Review of  Systems  Constitutional: Negative for fever.  Cardiovascular:  Negative for claudication.  Respiratory:  Negative for cough.   Gastrointestinal:  Negative for hematochezia.  Genitourinary:  Negative for hematuria.     EKGs/Labs/Other Test Reviewed:    EKG:  EKG is not ordered today.  The ekg ordered today demonstrates n/a  Recent Labs: 07/21/2021: ALT 14 01/30/2022: BUN 20; Creatinine, Ser 1.63; Hemoglobin 11.9; Platelets 160.0; Potassium 4.1; Sodium 142   Recent Lipid Panel Recent Labs    02/07/22 1248  CHOL 111  TRIG 235.0*  HDL 27.10*  VLDL 47.0*  LDLDIRECT 62.0     Risk Assessment/Calculations/Metrics:              Physical Exam:    VS:  BP 124/62   Pulse (!) 55   Ht '5\' 10"'$  (1.778 m)   Wt 194 lb 12.8 oz (88.4 kg)   SpO2 98%   BMI 27.95 kg/m     Wt Readings from Last 3 Encounters:  02/13/22 194 lb 12.8 oz (88.4 kg)  02/07/22 192 lb 6 oz (87.3 kg)  07/28/21 199 lb 2 oz (90.3 kg)    Constitutional:      Appearance: Healthy appearance. Not in distress.  Neck:     Vascular: No JVR. JVD normal.  Pulmonary:     Effort: Pulmonary effort is normal.     Breath sounds: No wheezing. No rales.  Cardiovascular:     Normal rate. Regular rhythm. Normal S1. Normal S2.      Murmurs: There is no murmur.  Edema:    Peripheral edema absent.  Abdominal:     Palpations: Abdomen is soft.  Skin:    General: Skin is warm and dry.  Neurological:     General: No focal deficit present.     Mental Status: Alert and oriented to person, place and time.          ASSESSMENT & PLAN:   Coronary artery disease involving native coronary artery with angina pectoris ([place]) Hx of NSTEMI in 12/2006 tx with overlapping DES to LCx and staged DES to RCA. His last stress test was a stress echocardiogram in 2018 and this was normal. He had R shoulder pain as his anginal equivalent in 2008. He has not felt this same pain again until now. He has pain with exertion and it resolves with rest. He  has not pain of his shoulder with palpation or return of pain with positional changes. His EKG last week showed no ST-TW changes and his hs-Trop was neg. However, I think he is describing angina. He is already on 2 antianginals. I think he needs to undergo cardiac catheterization. I reviewed his case with  Dr. Burt Knack, who agreed.  Patient does not take any PDE-5 inhibitors. Arrange cardiac catheterization with Dr. Burt Knack Continue amlodipine 10 mg daily, aspirin 81 mg daily, Lipitor 20 mg daily, Toprol-XL 25 mg daily Rx for as needed nitroglycerin Start isosorbide 15 mg daily He knows to go to the emergency room if his symptoms change or worsen.  Essential hypertension Blood pressure is well controlled.  Continue amlodipine 10 mg daily, Toprol-XL 25 mg daily, lisinopril 40 mg daily.  HLD (hyperlipidemia) LDL optimal on most recent labs.  Triglycerides are somewhat elevated.  Continue Lipitor 20 mg daily.  Consider adding Vascepa if triglycerides amine above 150.  Chronic kidney disease, stage 3b ([place]) Check precath labs today.  If GFR <45, he will need to come in early for hydration.        Shared Decision Making/Informed Consent The risks [stroke (1 in 1000), death (1 in 1000), kidney failure [usually temporary] (1 in 500), bleeding (1 in 200), allergic reaction [possibly serious] (1 in 200)], benefits (diagnostic support and management of coronary artery disease) and alternatives of a cardiac catheterization were discussed in detail with Christian Kim and he is willing to proceed.   Dispo:  Return for Post Procedure Follow Up, w/ Christian Dopp, PA-C.   Medication Adjustments/Labs and Tests Ordered: Current medicines are reviewed at length with the patient today.  Concerns regarding medicines are outlined above.  Tests Ordered: Orders Placed This Encounter  Procedures   Basic metabolic panel   CBC   Medication Changes: Meds ordered this encounter  Medications   isosorbide mononitrate  (IMDUR) 30 MG 24 hr tablet    Sig: Take 0.5 tablets (15 mg total) by mouth daily.    Dispense:  45 tablet    Refill:  3   nitroGLYCERIN (NITROSTAT) 0.4 MG SL tablet    Sig: Place 1 tablet (0.4 mg total) under the tongue every 5 (five) minutes as needed for chest pain.    Dispense:  90 tablet    Refill:  3   Signed, Christian Dopp, PA-C  02/13/2022 1:18 PM    [address], [place], [place]  [address] Phone: [phone]; Fax: [phone]

## 2022-02-13 ENCOUNTER — Encounter: Payer: Self-pay | Admitting: Physician Assistant

## 2022-02-13 ENCOUNTER — Ambulatory Visit: Payer: Medicare PPO | Attending: Physician Assistant | Admitting: Physician Assistant

## 2022-02-13 VITALS — BP 124/62 | HR 55 | Ht 70.0 in | Wt 194.8 lb

## 2022-02-13 DIAGNOSIS — I10 Essential (primary) hypertension: Secondary | ICD-10-CM | POA: Diagnosis not present

## 2022-02-13 DIAGNOSIS — E782 Mixed hyperlipidemia: Secondary | ICD-10-CM

## 2022-02-13 DIAGNOSIS — N1832 Chronic kidney disease, stage 3b: Secondary | ICD-10-CM | POA: Diagnosis not present

## 2022-02-13 DIAGNOSIS — R072 Precordial pain: Secondary | ICD-10-CM

## 2022-02-13 DIAGNOSIS — I25119 Atherosclerotic heart disease of native coronary artery with unspecified angina pectoris: Secondary | ICD-10-CM

## 2022-02-13 LAB — BASIC METABOLIC PANEL
BUN/Creatinine Ratio: 12 (ref 10–24)
BUN: 19 mg/dL (ref 8–27)
CO2: 28 mmol/L (ref 20–29)
Calcium: 9.7 mg/dL (ref 8.6–10.2)
Chloride: 104 mmol/L (ref 96–106)
Creatinine, Ser: 1.57 mg/dL — ABNORMAL HIGH (ref 0.76–1.27)
Glucose: 91 mg/dL (ref 70–99)
Potassium: 4.5 mmol/L (ref 3.5–5.2)
Sodium: 140 mmol/L (ref 134–144)
eGFR: 45 mL/min/{1.73_m2} — ABNORMAL LOW (ref >59)

## 2022-02-13 LAB — CBC
Hematocrit: 38.3 % (ref 37.5–51.0)
Hemoglobin: 12.5 g/dL — ABNORMAL LOW (ref 13.0–17.7)
MCH: 26.5 pg — ABNORMAL LOW (ref 26.6–33.0)
MCHC: 32.6 g/dL (ref 31.5–35.7)
MCV: 81 fL (ref 79–97)
Platelets: 216 10*3/uL (ref 150–450)
RBC: 4.72 x10E6/uL (ref 4.14–5.80)
RDW: 17.5 % — ABNORMAL HIGH (ref 11.6–15.4)
WBC: 5.4 10*3/uL (ref 3.4–10.8)

## 2022-02-13 MED ORDER — ISOSORBIDE MONONITRATE ER 30 MG PO TB24: 15.0000 mg | ORAL_TABLET | Freq: Every day | ORAL | 3 refills | Status: DC

## 2022-02-13 MED ORDER — NITROGLYCERIN 0.4 MG SL SUBL: 0.4000 mg | SUBLINGUAL_TABLET | SUBLINGUAL | 3 refills | Status: DC | PRN

## 2022-02-13 NOTE — Assessment & Plan Note (Signed)
Check precath labs today.  If GFR <45, he will need to come in early for hydration.

## 2022-02-13 NOTE — Assessment & Plan Note (Addendum)
Hx of NSTEMI in 12/2006 tx with overlapping DES to LCx and staged DES to RCA. His last stress test was a stress echocardiogram in 2018 and this was normal. He had R shoulder pain as his anginal equivalent in 2008. He has not felt this same pain again until now. He has pain with exertion and it resolves with rest. He has not pain of his shoulder with palpation or return of pain with positional changes. His EKG last week showed no ST-TW changes and his hs-Trop was neg. However, I think he is describing angina. He is already on 2 antianginals. I think he needs to undergo cardiac catheterization. I reviewed his case with Dr. Burt Knack, who agreed.  Patient does not take any PDE-5 inhibitors. Arrange cardiac catheterization with Dr. Burt Knack Continue amlodipine 10 mg daily, aspirin 81 mg daily, Lipitor 20 mg daily, Toprol-XL 25 mg daily Rx for as needed nitroglycerin Start isosorbide 15 mg daily He knows to go to the emergency room if his symptoms change or worsen.

## 2022-02-13 NOTE — Patient Instructions (Addendum)
Medication Instructions:  Your physician has recommended you make the following change in your medication:   START Imdur 30 taking 1/2 tablet daily     *If you need a refill on your cardiac medications before your next appointment, please call your pharmacy*   Lab Work: TODAY:  BMET & CBC  If you have labs (blood work) drawn today and your tests are completely normal, you will receive your results only by: [place] (if you have MyChart) OR A paper copy in the mail If you have any lab test that is abnormal or we need to change your treatment, we will call you to review the results.   Testing/Procedures: Your physician has requested that you have a cardiac catheterization. Cardiac catheterization is used to diagnose and/or treat various heart conditions. Doctors may recommend this procedure for a number of different reasons. The most common reason is to evaluate chest pain. Chest pain can be a symptom of coronary artery disease (CAD), and cardiac catheterization can show whether plaque is narrowing or blocking your heart's arteries. This procedure is also used to evaluate the valves, as well as measure the blood flow and oxygen levels in different parts of your heart. For further information please visit [email or web address].[email or web address]. Please follow instruction sheet, BELOW:   [place] A DEPT OF [place] A DEPT OF [facility] [place], [place] [id number] [place] [address] Dept: [phone] Loc: [phone]  Christian Kim  02/13/2022  You are scheduled for a Cardiac Catheterization on Tuesday, September 25 with Dr. Sherren Mocha.  1. Please arrive at the [facility] (Main Entrance A) at [facility]: [address] [place],  [address] at 8:30 AM (This time is two hours before your procedure to ensure your preparation). Free valet parking service is available.    Special note: Every effort is made to have your procedure done on time. Please understand that emergencies sometimes delay scheduled procedures.  2. Diet: Do not eat solid foods after midnight.  The patient may have clear liquids until 5am upon the day of the procedure.  3. Labs: WILL BE DONE TODAY   4. Medication instructions in preparation for your procedure:   Contrast Allergy: No   Stop taking, Lisinopril (Zestril or Prinivil) Monday, September 24, SO SUNDAY 9/23 WILL BE YOUR LAST DOSE UNTIL AFTER YOUR CATH    On the morning of your procedure, take your Aspirin 81 mg and any morning medicines NOT listed above.  You may use sips of water.  5. Plan for one night stay--bring personal belongings. 6. Bring a current list of your medications and current insurance cards. 7. You MUST have a responsible person to drive you home. 8. Someone MUST be with you the first 24 hours after you arrive home or your discharge will be delayed. 9. Please wear clothes that are easy to get on and off and wear slip-on shoes.  Thank you for allowing [place] to care for you!   --  Invasive Cardiovascular services    Follow-Up: At [facility], you and your health needs are our priority.  As part of our continuing mission to provide you with exceptional heart care, we have created designated Provider Care Teams.  These Care Teams include your primary Cardiologist (physician) and Advanced Practice Providers (APPs -  Physician Assistants and Nurse Practitioners) who all work together to provide you with the care you  need, when you need it.  We recommend signing up for the patient portal called "MyChart".  Sign up information is provided on this After Visit Summary.  MyChart is used to connect with patients for Virtual Visits (Telemedicine).  Patients are able to view lab/test results, encounter notes, upcoming appointments, etc.  Non-urgent messages can be sent to your provider as well.   To  learn more about what you can do with MyChart, go to [email or web address].    Your next appointment:   2 weeks  03/06/22 ARRIVE AT 9:50  The format for your next appointment:   In Person  Provider:   Sherren Mocha, MD  or [name], PA-C         Other Instructions   Important Information About Sugar

## 2022-02-13 NOTE — Assessment & Plan Note (Signed)
Blood pressure is well controlled.  Continue amlodipine 10 mg daily, Toprol-XL 25 mg daily, lisinopril 40 mg daily.

## 2022-02-13 NOTE — Assessment & Plan Note (Signed)
LDL optimal on most recent labs.  Triglycerides are somewhat elevated.  Continue Lipitor 20 mg daily.  Consider adding Vascepa if triglycerides amine above 150.

## 2022-02-15 ENCOUNTER — Telehealth: Payer: Self-pay | Admitting: *Deleted

## 2022-02-15 NOTE — Telephone Encounter (Signed)
Cardiac Catheterization scheduled at [facility] for: Monday February 19, 2022 10:30 AM Arrival time and place: [place] Entrance A at: 5:30 AM-pre-procedure hydration  Nothing to eat after midnight prior to procedure, clear liquids until 5 AM day of procedure.  Medication instructions: -Hold:  Lisinopril -day before and day of procedure -per protocol GFR 45 -Except hold medications usual morning medications can be taken with sips of water including aspirin 81 mg.  Confirmed patient has responsible adult to drive home post procedure and be with patient first 24 hours after arriving home.  Patient reports no new symptoms concerning for COVID-19 in the past 10 days.  Reviewed procedure instructions with patient.

## 2022-02-19 ENCOUNTER — Other Ambulatory Visit: Payer: Self-pay

## 2022-02-19 ENCOUNTER — Ambulatory Visit (HOSPITAL_COMMUNITY)
Admission: RE | Admit: 2022-02-19 | Discharge: 2022-02-19 | Disposition: A | Discharge status: Home / Self Care | Payer: Medicare PPO | Attending: Cardiovascular Disease | Admitting: Cardiovascular Disease

## 2022-02-19 ENCOUNTER — Other Ambulatory Visit (HOSPITAL_COMMUNITY): Payer: Self-pay

## 2022-02-19 ENCOUNTER — Encounter (HOSPITAL_COMMUNITY)
Admission: RE | Disposition: A | Discharge status: Home / Self Care | Payer: Self-pay | Source: Home / Self Care | Attending: Cardiovascular Disease

## 2022-02-19 DIAGNOSIS — E785 Hyperlipidemia, unspecified: Secondary | ICD-10-CM | POA: Insufficient documentation

## 2022-02-19 DIAGNOSIS — I129 Hypertensive chronic kidney disease with stage 1 through stage 4 chronic kidney disease, or unspecified chronic kidney disease: Secondary | ICD-10-CM | POA: Diagnosis not present

## 2022-02-19 DIAGNOSIS — Z955 Presence of coronary angioplasty implant and graft: Secondary | ICD-10-CM | POA: Insufficient documentation

## 2022-02-19 DIAGNOSIS — K219 Gastro-esophageal reflux disease without esophagitis: Secondary | ICD-10-CM | POA: Diagnosis not present

## 2022-02-19 DIAGNOSIS — I2584 Coronary atherosclerosis due to calcified coronary lesion: Secondary | ICD-10-CM | POA: Diagnosis not present

## 2022-02-19 DIAGNOSIS — I2510 Atherosclerotic heart disease of native coronary artery without angina pectoris: Secondary | ICD-10-CM | POA: Diagnosis present

## 2022-02-19 DIAGNOSIS — Z79899 Other long term (current) drug therapy: Secondary | ICD-10-CM | POA: Diagnosis not present

## 2022-02-19 DIAGNOSIS — N1832 Chronic kidney disease, stage 3b: Secondary | ICD-10-CM | POA: Insufficient documentation

## 2022-02-19 DIAGNOSIS — I25119 Atherosclerotic heart disease of native coronary artery with unspecified angina pectoris: Secondary | ICD-10-CM

## 2022-02-19 DIAGNOSIS — Z7982 Long term (current) use of aspirin: Secondary | ICD-10-CM | POA: Insufficient documentation

## 2022-02-19 DIAGNOSIS — I252 Old myocardial infarction: Secondary | ICD-10-CM | POA: Diagnosis not present

## 2022-02-19 HISTORY — PX: INTRAVASCULAR PRESSURE WIRE/FFR STUDY: CATH118243

## 2022-02-19 HISTORY — PX: CORONARY STENT INTERVENTION: CATH118234

## 2022-02-19 HISTORY — PX: LEFT HEART CATH AND CORONARY ANGIOGRAPHY: CATH118249

## 2022-02-19 HISTORY — PX: INTRAVASCULAR ULTRASOUND/IVUS: CATH118244

## 2022-02-19 SURGERY — LEFT HEART CATH AND CORONARY ANGIOGRAPHY
Anesthesia: LOCAL

## 2022-02-19 MED ORDER — NITROGLYCERIN 1 MG/10 ML FOR IR/CATH LAB
INTRA_ARTERIAL | Status: AC
  Filled 2022-02-19: qty 10

## 2022-02-19 MED ORDER — ONDANSETRON HCL 4 MG/2ML IJ SOLN: 4.0000 mg | Freq: Four times a day (QID) | INTRAMUSCULAR | Status: DC | PRN

## 2022-02-19 MED ORDER — HEPARIN SODIUM (PORCINE) 1000 UNIT/ML IJ SOLN
INTRAMUSCULAR | Status: AC
  Filled 2022-02-19: qty 10

## 2022-02-19 MED ORDER — ADENOSINE (DIAGNOSTIC) 140MCG/KG/MIN
INTRAVENOUS | Status: DC | PRN
  Administered 2022-02-19: 140 ug/kg/min via INTRAVENOUS

## 2022-02-19 MED ORDER — VERAPAMIL HCL 2.5 MG/ML IV SOLN
INTRAVENOUS | Status: AC
  Filled 2022-02-19: qty 2

## 2022-02-19 MED ORDER — ADENOSINE 12 MG/4ML IV SOLN
INTRAVENOUS | Status: AC
  Filled 2022-02-19: qty 16

## 2022-02-19 MED ORDER — MIDAZOLAM HCL 2 MG/2ML IJ SOLN
INTRAMUSCULAR | Status: AC
  Filled 2022-02-19: qty 2

## 2022-02-19 MED ORDER — CLOPIDOGREL BISULFATE 300 MG PO TABS
ORAL_TABLET | ORAL | Status: DC | PRN
  Administered 2022-02-19: 600 mg via ORAL

## 2022-02-19 MED ORDER — LABETALOL HCL 5 MG/ML IV SOLN: 10.0000 mg | INTRAVENOUS | Status: DC | PRN

## 2022-02-19 MED ORDER — SODIUM CHLORIDE 0.9% FLUSH: 3.0000 mL | INTRAVENOUS | Status: DC | PRN

## 2022-02-19 MED ORDER — VERAPAMIL HCL 2.5 MG/ML IV SOLN
INTRAVENOUS | Status: DC | PRN
  Administered 2022-02-19: 10 mL via INTRA_ARTERIAL

## 2022-02-19 MED ORDER — CLOPIDOGREL BISULFATE 300 MG PO TABS
ORAL_TABLET | ORAL | Status: AC
  Filled 2022-02-19: qty 2

## 2022-02-19 MED ORDER — FAMOTIDINE IN NACL 20-0.9 MG/50ML-% IV SOLN
INTRAVENOUS | Status: AC
  Filled 2022-02-19: qty 50

## 2022-02-19 MED ORDER — HEPARIN SODIUM (PORCINE) 1000 UNIT/ML IJ SOLN
INTRAMUSCULAR | Status: DC | PRN
  Administered 2022-02-19: 5000 [IU] via INTRAVENOUS
  Administered 2022-02-19: 4000 [IU] via INTRAVENOUS
  Administered 2022-02-19: 5000 [IU] via INTRAVENOUS

## 2022-02-19 MED ORDER — SODIUM CHLORIDE 0.9% FLUSH: 3.0000 mL | Freq: Two times a day (BID) | INTRAVENOUS | Status: DC

## 2022-02-19 MED ORDER — HEPARIN (PORCINE) IN NACL 1000-0.9 UT/500ML-% IV SOLN
INTRAVENOUS | Status: DC | PRN
  Administered 2022-02-19 (×2): 500 mL

## 2022-02-19 MED ORDER — CLOPIDOGREL BISULFATE 75 MG PO TABS
75.0000 mg | ORAL_TABLET | Freq: Every day | ORAL | 3 refills | Status: DC
  Filled 2022-02-19: qty 90, 90d supply, fill #0

## 2022-02-19 MED ORDER — MIDAZOLAM HCL 2 MG/2ML IJ SOLN
INTRAMUSCULAR | Status: DC | PRN
  Administered 2022-02-19: 2 mg via INTRAVENOUS
  Administered 2022-02-19: 1 mg via INTRAVENOUS

## 2022-02-19 MED ORDER — ACETAMINOPHEN 325 MG PO TABS: 650.0000 mg | ORAL_TABLET | ORAL | Status: DC | PRN

## 2022-02-19 MED ORDER — CLOPIDOGREL BISULFATE 75 MG PO TABS: 75.0000 mg | ORAL_TABLET | Freq: Every day | ORAL | Status: DC

## 2022-02-19 MED ORDER — ROSUVASTATIN CALCIUM 20 MG PO TABS
20.0000 mg | ORAL_TABLET | Freq: Every day | ORAL | 3 refills | Status: DC
  Filled 2022-02-19: qty 90, 90d supply, fill #0

## 2022-02-19 MED ORDER — SODIUM CHLORIDE 0.9 % WEIGHT BASED INFUSION: 1.0000 mL/kg/h | INTRAVENOUS | Status: DC

## 2022-02-19 MED ORDER — LIDOCAINE HCL (PF) 1 % IJ SOLN
INTRAMUSCULAR | Status: DC | PRN
  Administered 2022-02-19: 2 mL

## 2022-02-19 MED ORDER — ASPIRIN 81 MG PO CHEW: 81.0000 mg | CHEWABLE_TABLET | ORAL | Status: DC

## 2022-02-19 MED ORDER — FENTANYL CITRATE (PF) 100 MCG/2ML IJ SOLN
INTRAMUSCULAR | Status: DC | PRN
  Administered 2022-02-19 (×2): 25 ug via INTRAVENOUS

## 2022-02-19 MED ORDER — SODIUM CHLORIDE 0.9 % IV SOLN: 250.0000 mL | INTRAVENOUS | Status: DC | PRN

## 2022-02-19 MED ORDER — FENTANYL CITRATE (PF) 100 MCG/2ML IJ SOLN
INTRAMUSCULAR | Status: AC
  Filled 2022-02-19: qty 2

## 2022-02-19 MED ORDER — IOHEXOL 350 MG/ML SOLN
INTRAVENOUS | Status: DC | PRN
  Administered 2022-02-19: 80 mL

## 2022-02-19 MED ORDER — LIDOCAINE HCL (PF) 1 % IJ SOLN
INTRAMUSCULAR | Status: AC
  Filled 2022-02-19: qty 30

## 2022-02-19 MED ORDER — HYDRALAZINE HCL 20 MG/ML IJ SOLN: 10.0000 mg | INTRAMUSCULAR | Status: DC | PRN

## 2022-02-19 MED ORDER — NITROGLYCERIN 1 MG/10 ML FOR IR/CATH LAB
INTRA_ARTERIAL | Status: DC | PRN
  Administered 2022-02-19 (×2): 150 ug via INTRACORONARY

## 2022-02-19 MED ORDER — PANTOPRAZOLE SODIUM 40 MG PO TBEC
40.0000 mg | DELAYED_RELEASE_TABLET | Freq: Every day | ORAL | 3 refills | Status: DC
  Filled 2022-02-19: qty 90, 90d supply, fill #0

## 2022-02-19 MED ORDER — SODIUM CHLORIDE 0.9 % WEIGHT BASED INFUSION
1.0000 mL/kg/h | INTRAVENOUS | Status: DC
  Administered 2022-02-19: 1 mL/kg/h via INTRAVENOUS

## 2022-02-19 MED ORDER — SODIUM CHLORIDE 0.9 % WEIGHT BASED INFUSION
3.0000 mL/kg/h | INTRAVENOUS | Status: AC
  Administered 2022-02-19: 3 mL/kg/h via INTRAVENOUS

## 2022-02-19 MED ORDER — HEPARIN (PORCINE) IN NACL 1000-0.9 UT/500ML-% IV SOLN
INTRAVENOUS | Status: AC
  Filled 2022-02-19: qty 1000

## 2022-02-19 MED ORDER — CLOPIDOGREL BISULFATE 75 MG PO TABS: 75.0000 mg | ORAL_TABLET | Freq: Every day | ORAL | 0 refills | Status: DC

## 2022-02-19 SURGICAL SUPPLY — 29 items
BAG MDU5 PLUS (MISCELLANEOUS) IMPLANT
BAG US STRL MDU5 + (MISCELLANEOUS) ×1
BALL SAPPHIRE NC24 3.25X12 (BALLOONS) ×1
BALLN EMERGE MR 2.5X15 (BALLOONS) ×1
BALLN SCOREFLEX 2.50X10 (BALLOONS) ×1
BALLOON EMERGE MR 2.5X15 (BALLOONS) IMPLANT
BALLOON SAPPHIRE NC24 3.25X12 (BALLOONS) IMPLANT
BALLOON SCOREFLEX 2.50X10 (BALLOONS) IMPLANT
CATH 5FR JL3.5 JR4 ANG PIG MP (CATHETERS) IMPLANT
CATH INFINITI 5 FR JR5 (CATHETERS) IMPLANT
CATH OPTICROSS HD (CATHETERS) IMPLANT
CATH VISTA GUIDE 6FR AL1 (CATHETERS) IMPLANT
DEVICE RAD COMP TR BAND LRG (VASCULAR PRODUCTS) IMPLANT
ELECT DEFIB PAD ADLT CADENCE (PAD) IMPLANT
GLIDESHEATH SLEND SS 6F .021 (SHEATH) IMPLANT
GUIDEWIRE INQWIRE 1.5J.035X260 (WIRE) IMPLANT
GUIDEWIRE PRESSURE X 175 (WIRE) IMPLANT
INQWIRE 1.5J .035X260CM (WIRE) ×2
KIT ESSENTIALS PG (KITS) IMPLANT
KIT HEART LEFT (KITS) ×2 IMPLANT
PACK CARDIAC CATHETERIZATION (CUSTOM PROCEDURE TRAY) ×2 IMPLANT
SLED PULL BACK IVUS (MISCELLANEOUS) IMPLANT
STENT SYNERGY XD 3.0X12 (Permanent Stent) IMPLANT
STENT SYNERGY XD 3.0X16 (Permanent Stent) IMPLANT
SYNERGY XD 3.0X12 (Permanent Stent) ×1 IMPLANT
SYNERGY XD 3.0X16 (Permanent Stent) ×1 IMPLANT
TRANSDUCER W/STOPCOCK (MISCELLANEOUS) ×2 IMPLANT
TUBING CIL FLEX 10 FLL-RA (TUBING) ×2 IMPLANT
WIRE COUGAR XT STRL 190CM (WIRE) IMPLANT

## 2022-02-19 NOTE — Discharge Summary (Signed)
Discharge Summary for Same Day PCI   Patient ID: Christian Kim MRN: [id number]; DOB: [birth year]  Admit date: 02/19/2022 Discharge date: 02/19/2022  Primary Care Provider: Ria Bush, MD  Primary Cardiologist: Christian Mocha, MD  Primary Electrophysiologist:  None   Discharge Diagnoses    Active Problems:   Coronary artery disease involving native coronary artery with angina pectoris [facility])  Diagnostic Studies/Procedures    Cardiac Catheterization 02/19/2022:    Prox LAD to Mid LAD lesion is 65% stenosed.   Prox Cx to Dist Cx lesion is 20% stenosed.   Prox RCA to Mid RCA lesion is 20% stenosed.   Prox RCA lesion is 80% stenosed.   Ost RCA to Prox RCA lesion is 80% stenosed.   A drug-eluting stent was successfully placed using a SYNERGY XD 3.0X16.   A drug-eluting stent was successfully placed using a SYNERGY XD 3.0X12.   Post intervention, there is a 0% residual stenosis.   Post intervention, there is a 0% residual stenosis.   1.  Widely patent left main with no stenosis 2.  Patent LAD with moderate diffuse mid vessel stenosis unchanged from the old cath study 3.  Continued patency of the overlapping stents in the mid circumflex with no obstructive disease and at vessel 4.  Severe ostial and mid vessel stenoses in the RCA, both treated successfully after pressure wire analysis with a 3 oh by 16 Synergy DES in the mid vessel and a 3.0 x 12 mm Synergy DES in the ostium, PCI aided by intravascular ultrasound.   Recommend: Same day DC protocol if criteria met. DAPT with ASA and plavix x 6 months minimum (loaded with plavix 600 mg on cath lab table). Med Rx for residual CAD. LAD could be treated if recurrent angina refractory to medical therapy.    Diagnostic Dominance: Right  Intervention   _____________   History of Present Illness     Christian Kim is a 76 y.o. male with past medical history of CAD status post non-STEMI with complex PCI of circumflex '08,  hypertension, hyperlipidemia, CKD, and GERD.  Who was recently referred by his PCP for right shoulder pain that was similar to his anginal equivalent.  EKG obtained showed no acute changes.  Also had high-sensitivity troponin checked which was negative.  He was seen in the office on 9/19 with Christian Kim with recommendations to proceed with cardiac catheterization given he was already on 2 antianginals.  He was continued on amlodipine 10 mg daily, aspirin 81 mg daily, Lipitor 20 mg daily, metoprolol XL 25 mg daily with isosorbide 15 mg daily added.  Hospital Course     The patient underwent cardiac cath as noted above with widely patent left main, patent LAD with moderate diffuse mid vessel stenosis, severe ostial/mid vessel stenosis of the RCA treated with PCI/DES to mid vessel using IVUS. Plan for DAPT with ASA/Plavix for at least 6 months. The patient was seen by cardiac rehab while in short stay. There were no observed complications post cath. Radial cath site was re-evaluated prior to discharge and found to be stable without any complications. Instructions/precautions regarding cath site care were given prior to discharge.  Christian Kim was seen by Dr. Burt Kim and determined stable for discharge home. Follow up with our office has been arranged. Medications are listed below. Pertinent changes include addition of Plavix, switched Lipitor from 10 to Crestor 62m daily.  _____________  Cath/PCI Registry Performance & Quality Measures: Aspirin prescribed? - Yes ADP  Receptor Inhibitor (Plavix/Clopidogrel, Brilinta/Ticagrelor or Effient/Prasugrel) prescribed (includes medically managed patients)? - Yes High Intensity Statin (Lipitor 40-59m or Crestor 20-443m prescribed? - Yes For EF <40%, was ACEI/ARB prescribed? - Not Applicable (EF >/= 4045%For EF <40%, Aldosterone Antagonist (Spironolactone or Eplerenone) prescribed? - Not Applicable (EF >/= 4080%Cardiac Rehab Phase II ordered (Included  Medically managed Patients)? - Yes  _____________   Discharge Vitals Blood pressure (!) 152/64, pulse (!) 47, temperature 97.9 F (36.6 C), temperature source Oral, resp. rate 14, height 5' 10" (1.778 m), weight 86.2 kg, SpO2 96 %.  Filed Weights   02/19/22 0605  Weight: 86.2 kg    Last Labs & Radiologic Studies    CBC No results for input(s): "WBC", "NEUTROABS", "HGB", "HCT", "MCV", "PLT" in the last 72 hours. Basic Metabolic Panel No results for input(s): "NA", "K", "CL", "CO2", "GLUCOSE", "BUN", "CREATININE", "CALCIUM", "MG", "PHOS" in the last 72 hours. Liver Function Tests No results for input(s): "AST", "ALT", "ALKPHOS", "BILITOT", "PROT", "ALBUMIN" in the last 72 hours. No results for input(s): "LIPASE", "AMYLASE" in the last 72 hours. High Sensitivity Troponin:   No results for input(s): "TROPONINIHS" in the last 720 hours.  BNP Invalid input(s): "POCBNP" D-Dimer No results for input(s): "DDIMER" in the last 72 hours. Hemoglobin A1C No results for input(s): "HGBA1C" in the last 72 hours. Fasting Lipid Panel No results for input(s): "CHOL", "HDL", "LDLCALC", "TRIG", "CHOLHDL", "LDLDIRECT" in the last 72 hours. Thyroid Function Tests No results for input(s): "TSH", "T4TOTAL", "T3FREE", "THYROIDAB" in the last 72 hours.  Invalid input(s): "FREET3" _____________  CARDIAC CATHETERIZATION  Result Date: 02/19/2022   Prox LAD to Mid LAD lesion is 65% stenosed.   Prox Cx to Dist Cx lesion is 20% stenosed.   Prox RCA to Mid RCA lesion is 20% stenosed.   Prox RCA lesion is 80% stenosed.   Ost RCA to Prox RCA lesion is 80% stenosed.   A drug-eluting stent was successfully placed using a SYNERGY XD 3.0X16.   A drug-eluting stent was successfully placed using a SYNERGY XD 3.0X12.   Post intervention, there is a 0% residual stenosis.   Post intervention, there is a 0% residual stenosis. 1.  Widely patent left main with no stenosis 2.  Patent LAD with moderate diffuse mid vessel  stenosis unchanged from the old cath study 3.  Continued patency of the overlapping stents in the mid circumflex with no obstructive disease and at vessel 4.  Severe ostial and mid vessel stenoses in the RCA, both treated successfully after pressure wire analysis with a 3 oh by 16 Synergy DES in the mid vessel and a 3.0 x 12 mm Synergy DES in the ostium, PCI aided by intravascular ultrasound. Recommend: Same day DC protocol if criteria met. DAPT with ASA and plavix x 6 months minimum (loaded with plavix 600 mg on cath lab table). Med Rx for residual CAD. LAD could be treated if recurrent angina refractory to medical therapy.    Disposition   Pt is being discharged home today in good condition.  Follow-up Plans & Appointments     Follow-up Information     WeLiliane ShiPA-C Follow up on 03/06/2022.   Specialties: Cardiology, Physician Assistant Why: at [place] your follow up appt Contact information: [id number]              Discharge Instructions     Amb Referral to Cardiac Rehabilitation   Complete  by: As directed    Diagnosis:  PTCA Coronary Stents     After initial evaluation and assessments completed: Virtual Based Care may be provided alone or in conjunction with Phase 2 Cardiac Rehab based on patient barriers.: Yes   Intensive Cardiac Rehabilitation (ICR) [place] location only OR Traditional Cardiac Rehabilitation (TCR) *If criteria for ICR are not met will enroll in TCR [facility] only): Yes        Discharge Medications   Allergies as of 02/19/2022       Reactions   Allopurinol Rash        Medication List     STOP taking these medications    atorvastatin 20 MG tablet Commonly known as: LIPITOR   omeprazole 20 MG capsule Commonly known as: PRILOSEC       TAKE these medications    acetaminophen 500 MG tablet Commonly known as: TYLENOL Take 1 tablet (500 mg total) by mouth every 6 (six) hours as  needed.   amLODipine 10 MG tablet Commonly known as: NORVASC TAKE 1 TABLET BY MOUTH ONCE A DAY   aspirin 81 MG tablet Take 81 mg by mouth daily.   clopidogrel 75 MG tablet Commonly known as: Plavix Take 1 tablet (75 mg total) by mouth daily. Notes to patient: NEXT DOSE 9/26   isosorbide mononitrate 30 MG 24 hr tablet Commonly known as: IMDUR Take 0.5 tablets (15 mg total) by mouth daily. What changed: when to take this   lisinopril 40 MG tablet Commonly known as: ZESTRIL TAKE 1 TABLET BY MOUTH ONCE A DAY   metoprolol succinate 25 MG 24 hr tablet Commonly known as: TOPROL-XL TAKE 1 TABLET BY MOUTH ONCE A DAY   Mitigare 0.6 MG Caps Generic drug: Colchicine Take 1 capsule by mouth daily as needed (gout flare). Take 2 capsules on the first day of flare   nitroGLYCERIN 0.4 MG SL tablet Commonly known as: NITROSTAT Place 1 tablet (0.4 mg total) under the tongue every 5 (five) minutes as needed for chest pain.   pantoprazole 40 MG tablet Commonly known as: Protonix Take 1 tablet (40 mg total) by mouth at bedtime.   probenecid 500 MG tablet Commonly known as: BENEMID TAKE 1/2 A TABLET (250 MG TOTAL) BY MOUTH 2 TIMES DAILY.   rosuvastatin 20 MG tablet Commonly known as: Crestor Take 1 tablet (20 mg total) by mouth daily.   triamcinolone cream 0.1 % Commonly known as: KENALOG Apply 1 application  topically daily as needed (irritation).   ursodiol 300 MG capsule Commonly known as: ACTIGALL Take 2 capsules (600 mg total) by mouth 2 (two) times daily.   Vitamin D3 25 MCG (1000 UT) Caps Take 1 capsule (1,000 Units total) by mouth daily.        Allergies Allergies  Allergen Reactions   Allopurinol Rash    Outstanding Labs/Studies   FLP/LFTs in 8 weeks   Duration of Discharge Encounter   Greater than 30 minutes including physician time.  Signed, Reino Bellis, NP 02/19/2022, 2:05 PM

## 2022-02-19 NOTE — Progress Notes (Signed)
Patient educated on risk factors, exercise guidelines, angina, ntg, PTCA/stent, restrictions, catheterization site care, nutrition; cardiac rehab phase 2 at [facility] discussed.  Rodman RN 9/25/20231:45 PM

## 2022-02-19 NOTE — Interval H&P Note (Signed)
  Cath Lab Visit (complete for each Cath Lab visit)  Clinical Evaluation Leading to the Procedure:   ACS: No.  Non-ACS:    Anginal Classification: CCS II  Anti-ischemic medical therapy: Maximal Therapy (2 or more classes of medications)  Non-Invasive Test Results: No non-invasive testing performed  Prior CABG: No previous CABG      History and Physical Interval Note:  02/19/2022 8:17 AM  Christian Kim  has presented today for surgery, with the diagnosis of cad.  The various methods of treatment have been discussed with the patient and family. After consideration of risks, benefits and other options for treatment, the patient has consented to  Procedure(s): LEFT HEART CATH AND CORONARY ANGIOGRAPHY (N/A) as a surgical intervention.  The patient's history has been reviewed, patient examined, no change in status, stable for surgery.  I have reviewed the patient's chart and labs.  Questions were answered to the patient's satisfaction.     Sherren Mocha

## 2022-02-20 ENCOUNTER — Encounter (HOSPITAL_COMMUNITY): Payer: Self-pay | Admitting: Cardiovascular Disease

## 2022-02-20 LAB — POCT ACTIVATED CLOTTING TIME
Activated Clotting Time: 263 seconds
Activated Clotting Time: 341 seconds
Activated Clotting Time: 281 seconds

## 2022-03-04 NOTE — Progress Notes (Unsigned)
Cardiology Office Note:    Date:  03/04/2022   ID:  Christian Kim, DOB Nov 18, 1945, MRN [id number]  PCP:  Ria Bush, [place] Providers Cardiologist:  Sherren Mocha, MD { Click to update primary MD,subspecialty MD or APP then REFRESH:1}  *** Referring MD: Ria Bush, MD   Chief Complaint:  No chief complaint on file. {Click here for Visit Info    :1}   Patient Profile: Coronary artery disease  NSTEMI in 12/2006 s/p complex PCI of LCx w overlapping DES Staged PCI 3 x 15 mm Promus DES to RCA LAD 60 >> managed med Stress Echocardiogram 2018: normal S/p 3 x 16 mm and 3 x 12 mm Synergy DES to ost and mid RCA 01/2022 Residual CAD: pLAD 65, LCx stents patent  Hypertension  Hyperlipidemia  Chronic kidney disease  GERD s/p cholecystectomy  Choledocholithiasis s/p multiple ERCPs w stone extraction, sphincterotomy   Prior CV Studies: LEFT HEART CATH 02/19/2022   Prox LAD to Mid LAD lesion is 65% stenosed.   Prox Cx to Dist Cx lesion is 20% stenosed.   Prox RCA to Mid RCA lesion is 20% stenosed.   Prox RCA lesion is 80% stenosed.   Ost RCA to Prox RCA lesion is 80% stenosed.   A drug-eluting stent was successfully placed using a SYNERGY XD 3.0X16.   A drug-eluting stent was successfully placed using a SYNERGY XD 3.0X12.   Post intervention, there is a 0% residual stenosis.   Post intervention, there is a 0% residual stenosis.  1.  Widely patent left main with no stenosis 2.  Patent LAD with moderate diffuse mid vessel stenosis unchanged from the old cath study 3.  Continued patency of the overlapping stents in the mid circumflex with no obstructive disease and at vessel 4.  Severe ostial and mid vessel stenoses in the RCA, both treated successfully after pressure wire analysis with a 3 oh by 16 Synergy DES in the mid vessel and a 3.0 x 12 mm Synergy DES in the ostium, PCI aided by intravascular ultrasound.        ECHOCARDIOGRAM STRESS TEST  10/17/16 Impressions:  - Negative stress echo. NO evidence of ischemia .   History of Present Illness:   Christian Kim is a 76 y.o. male with the above problem list.  He was last seen 02/13/22 with R shoulder pain similar to his previous angina. We arranged cardiac catheterization 02/19/22 which demonstrated mod non-obs dz in the LAD, patent stents in the LCx and high grade disease in the ostial and mid RCA. Both lesions in the RCA were tx with PCI with a DES. He returns for f/u. ***      Past Medical History:  Diagnosis Date   CAD (coronary artery disease) 2008   stent   CAP (community acquired pneumonia) 03/07/2015   Choledocholithiasis    recurrent/multiple ERCPs with stone extraction and sphincterotomy    CHOLEDOCHOLITHIASIS 09/20/2007   Qualifier: Diagnosis of  By: Roxan Hockey, [place]     GERD (gastroesophageal reflux disease)    Gout 2013   dx by crystal analysis   Hyperlipidemia    Hypertension    Myocardial infarction [facility]) 12-2006   Prostatitis 01/22/2017   Current Medications: No outpatient medications have been marked as taking for the 03/06/22 encounter (Appointment) with [name] T, PA-C.    Allergies:   Allopurinol   Social History   Tobacco Use   Smoking status: Never   Smokeless tobacco: Never  Vaping Use   Vaping Use: Never used  Substance Use Topics   Alcohol use: Yes    Comment: rarely drinks - 1 to 2 beers per month   Drug use: No    Family Hx: The patient's family history includes Liver cancer in his mother; Lung cancer in his mother. There is no history of Colon cancer, Esophageal cancer, Stomach cancer, or Rectal cancer.  ROS   EKGs/Labs/Other Test Reviewed:    EKG:  EKG is *** ordered today.  The ekg ordered today demonstrates ***  Recent Labs: 07/21/2021: ALT 14 02/13/2022: BUN 19; Creatinine, Ser 1.57; Hemoglobin 12.5; Platelets 216; Potassium 4.5; Sodium 140   Recent Lipid Panel Recent Labs    02/07/22 1248  CHOL 111  TRIG  235.0*  HDL 27.10*  VLDL 47.0*  LDLDIRECT 62.0     Risk Assessment/Calculations/Metrics:   {Does this patient have ATRIAL FIBRILLATION?[phone]}     No BP recorded.  {Refresh Note OR Click here to enter BP  :1}***    Physical Exam:    VS:  There were no vitals taken for this visit.    Wt Readings from Last 3 Encounters:  02/19/22 190 lb (86.2 kg)  02/13/22 194 lb 12.8 oz (88.4 kg)  02/07/22 192 lb 6 oz (87.3 kg)    Physical Exam ***     ASSESSMENT & PLAN:   No problem-specific Assessment & Plan notes found for this encounter.  *** Coronary artery disease involving native coronary artery with angina pectoris ([place]) Hx of NSTEMI in 12/2006 tx with overlapping DES to LCx and staged DES to RCA. His last stress test was a stress echocardiogram in 2018 and this was normal. He had R shoulder pain as his anginal equivalent in 2008. He has not felt this same pain again until now. He has pain with exertion and it resolves with rest. He has not pain of his shoulder with palpation or return of pain with positional changes. His EKG last week showed no ST-TW changes and his hs-Trop was neg. However, I think he is describing angina. He is already on 2 antianginals. I think he needs to undergo cardiac catheterization. I reviewed his case with Dr. Burt Knack, who agreed.  Patient does not take any PDE-5 inhibitors. Arrange cardiac catheterization with Dr. Burt Knack Continue amlodipine 10 mg daily, aspirin 81 mg daily, Lipitor 20 mg daily, Toprol-XL 25 mg daily Rx for as needed nitroglycerin Start isosorbide 15 mg daily He knows to go to the emergency room if his symptoms change or worsen.   Essential hypertension Blood pressure is well controlled.  Continue amlodipine 10 mg daily, Toprol-XL 25 mg daily, lisinopril 40 mg daily.   HLD (hyperlipidemia) LDL optimal on most recent labs.  Triglycerides are somewhat elevated.  Continue Lipitor 20 mg daily.  Consider adding Vascepa if triglycerides amine above  150.   Chronic kidney disease, stage 3b ([place]) Check precath labs today.  If GFR <45, he will need to come in early for hydration.***      {Are you ordering a CV Procedure (e.g. stress test, cath, DCCV, TEE, etc)?   Press F2        :[id number]}   Dispo:  No follow-ups on file.   Medication Adjustments/Labs and Tests Ordered: Current medicines are reviewed at length with the patient today.  Concerns regarding medicines are outlined above.  Tests Ordered: No orders of the defined types were placed in this encounter.  Medication Changes: No orders of the defined types were  placed in this encounter.  Signed, [name], PA-C  03/04/2022 10:21 PM    [address], [place], [place]  [address] Phone: [phone]; Fax: [phone]

## 2022-03-06 ENCOUNTER — Ambulatory Visit: Payer: Medicare PPO | Attending: Physician Assistant | Admitting: Physician Assistant

## 2022-03-06 ENCOUNTER — Ambulatory Visit (INDEPENDENT_AMBULATORY_CARE_PROVIDER_SITE_OTHER)
Admission: RE | Admit: 2022-03-06 | Discharge: 2022-03-06 | Disposition: A | Discharge status: Home / Self Care | Payer: Medicare PPO | Source: Ambulatory Visit | Attending: Family | Admitting: Family

## 2022-03-06 ENCOUNTER — Encounter: Payer: Self-pay | Admitting: Physician Assistant

## 2022-03-06 ENCOUNTER — Ambulatory Visit: Payer: Medicare PPO | Admitting: Family

## 2022-03-06 ENCOUNTER — Other Ambulatory Visit: Payer: Self-pay | Admitting: Family

## 2022-03-06 ENCOUNTER — Encounter: Payer: Self-pay | Admitting: Family

## 2022-03-06 ENCOUNTER — Telehealth: Payer: Self-pay | Admitting: *Deleted

## 2022-03-06 VITALS — BP 120/66 | HR 77 | Temp 100.2°F | Resp 16 | Ht 70.0 in | Wt 195.1 lb

## 2022-03-06 VITALS — BP 120/66 | HR 72 | Temp 101.1°F | Ht 70.0 in | Wt 195.8 lb

## 2022-03-06 DIAGNOSIS — I10 Essential (primary) hypertension: Secondary | ICD-10-CM | POA: Diagnosis not present

## 2022-03-06 DIAGNOSIS — R509 Fever, unspecified: Secondary | ICD-10-CM | POA: Diagnosis not present

## 2022-03-06 DIAGNOSIS — M25511 Pain in right shoulder: Secondary | ICD-10-CM

## 2022-03-06 DIAGNOSIS — R350 Frequency of micturition: Secondary | ICD-10-CM | POA: Diagnosis not present

## 2022-03-06 DIAGNOSIS — J029 Acute pharyngitis, unspecified: Secondary | ICD-10-CM | POA: Diagnosis not present

## 2022-03-06 DIAGNOSIS — E782 Mixed hyperlipidemia: Secondary | ICD-10-CM | POA: Diagnosis not present

## 2022-03-06 DIAGNOSIS — D72810 Lymphocytopenia: Secondary | ICD-10-CM | POA: Insufficient documentation

## 2022-03-06 DIAGNOSIS — J020 Streptococcal pharyngitis: Secondary | ICD-10-CM | POA: Diagnosis not present

## 2022-03-06 DIAGNOSIS — I2510 Atherosclerotic heart disease of native coronary artery without angina pectoris: Secondary | ICD-10-CM

## 2022-03-06 DIAGNOSIS — Z20822 Contact with and (suspected) exposure to covid-19: Secondary | ICD-10-CM

## 2022-03-06 DIAGNOSIS — N1832 Chronic kidney disease, stage 3b: Secondary | ICD-10-CM | POA: Diagnosis not present

## 2022-03-06 LAB — CBC WITH DIFFERENTIAL/PLATELET
Basophils Absolute: 0 10*3/uL (ref 0.0–0.1)
Basophils Relative: 0.2 % (ref 0.0–3.0)
Eosinophils Absolute: 0 10*3/uL (ref 0.0–0.7)
Eosinophils Relative: 0.4 % (ref 0.0–5.0)
HCT: 37 % — ABNORMAL LOW (ref 39.0–52.0)
Hemoglobin: 12.3 g/dL — ABNORMAL LOW (ref 13.0–17.0)
Lymphocytes Relative: 6.5 % — ABNORMAL LOW (ref 12.0–46.0)
Lymphs Abs: 0.6 10*3/uL — ABNORMAL LOW (ref 0.7–4.0)
MCHC: 33.1 g/dL (ref 30.0–36.0)
MCV: 81.8 fl (ref 78.0–100.0)
Monocytes Absolute: 0.8 10*3/uL (ref 0.1–1.0)
Monocytes Relative: 8.4 % (ref 3.0–12.0)
Neutro Abs: 7.7 10*3/uL (ref 1.4–7.7)
Neutrophils Relative %: 84.5 % — ABNORMAL HIGH (ref 43.0–77.0)
Platelets: 190 10*3/uL (ref 150.0–400.0)
RBC: 4.53 Mil/uL (ref 4.22–5.81)
RDW: 16.7 % — ABNORMAL HIGH (ref 11.5–15.5)
WBC: 9.2 10*3/uL (ref 4.0–10.5)

## 2022-03-06 LAB — POC URINALSYSI DIPSTICK (AUTOMATED)
Bilirubin, UA: NEGATIVE
Blood, UA: NEGATIVE
Glucose, UA: NEGATIVE
Ketones, UA: NEGATIVE
Leukocytes, UA: NEGATIVE
Nitrite, UA: NEGATIVE
Protein, UA: NEGATIVE
Spec Grav, UA: 1.015 (ref 1.010–1.025)
Urobilinogen, UA: 0.2 E.U./dL
pH, UA: 6 (ref 5.0–8.0)

## 2022-03-06 LAB — COMPREHENSIVE METABOLIC PANEL
ALT: 16 U/L (ref 0–53)
AST: 16 U/L (ref 0–37)
Albumin: 4.5 g/dL (ref 3.5–5.2)
Alkaline Phosphatase: 107 U/L (ref 39–117)
BUN: 23 mg/dL (ref 6–23)
CO2: 26 mEq/L (ref 19–32)
Calcium: 9.4 mg/dL (ref 8.4–10.5)
Chloride: 104 mEq/L (ref 96–112)
Creatinine, Ser: 1.71 mg/dL — ABNORMAL HIGH (ref 0.40–1.50)
GFR: 38.33 mL/min — ABNORMAL LOW (ref >60.00)
Glucose, Bld: 92 mg/dL (ref 70–99)
Potassium: 4.6 mEq/L (ref 3.5–5.1)
Sodium: 138 mEq/L (ref 135–145)
Total Bilirubin: 1 mg/dL (ref 0.2–1.2)
Total Protein: 7.5 g/dL (ref 6.0–8.3)

## 2022-03-06 LAB — POC INFLUENZA A&B (BINAX/QUICKVUE)
Influenza A, POC: NEGATIVE
Influenza B, POC: NEGATIVE

## 2022-03-06 LAB — POCT RAPID STREP A (OFFICE): Rapid Strep A Screen: POSITIVE — AB

## 2022-03-06 LAB — POC COVID19 BINAXNOW: SARS Coronavirus 2 Ag: NEGATIVE

## 2022-03-06 MED ORDER — AMOXICILLIN 500 MG PO CAPS: 500.0000 mg | ORAL_CAPSULE | Freq: Two times a day (BID) | ORAL | 0 refills | Status: AC

## 2022-03-06 NOTE — Patient Instructions (Addendum)
You were found to be strep positive,  Take antibiotics that have been sent to the pharmacy.  Change your toothbrush after 24 hours on the antibiotics.  Gargle with warm salt water as needed for sore throat.   Stop by the lab prior to leaving today. I will notify you of your results once received.    Regards,   Eugenia Pancoast FNP-C

## 2022-03-06 NOTE — Assessment & Plan Note (Signed)
GFR 45.

## 2022-03-06 NOTE — Assessment & Plan Note (Signed)
Blood pressure controlled.  Continue amlodipine 10 mg daily, isosorbide mononitrate 15 mg daily, lisinopril 40 mg daily, Toprol-XL 25 mg daily.

## 2022-03-06 NOTE — Assessment & Plan Note (Signed)
History of non-STEMI in 2018 with overlapping DES to the LCx and staged DES to the RCA.  He recently presented with right shoulder symptoms that were reminiscent of his previous angina.  We set him up for cardiac catheterization which demonstrated high-grade disease in the ostial and mid RCA.  Both lesions were treated with DES.  He also had residual moderate nonobstructive disease in the LAD, patent LCx stents, patent mid RCA stent.  He continues to have right shoulder pain.  There was no change with PCI.  This all appears to be musculoskeletal/noncardiac.  He does note improved energy since his PCI.  We discussed the importance of dual antiplatelet therapy for minimum of 6 months post PCI.  We also discussed the importance of cardiac rehabilitation. Continue amlodipine 10 mg daily, aspirin 81 mg daily, Plavix 75 mg daily, isosorbide mononitrate 15 mg daily, lisinopril 40 mg daily, Toprol-XL 25 mg daily, Crestor 20 mg daily Follow-up with Dr. Burt Knack in 6 months

## 2022-03-06 NOTE — Assessment & Plan Note (Signed)
He developed symptoms of fever, chills, malaise and low back pain this morning.  He seems to be describing symptoms of influenza.  He also notes frequent urination and could have a UTI.  We were able to get him into see primary care later today for further evaluation and management.

## 2022-03-06 NOTE — Assessment & Plan Note (Addendum)
Strep tested positive in office.  rx for amox 500 mg po bid x 10 days.  tyelnol prn sore throat/fever Pt told to F/u if no improvement in the next 2-3 days.

## 2022-03-06 NOTE — Progress Notes (Signed)
Established Patient Office Visit  Subjective:  Patient ID: Christian Kim, male    DOB: August 07, 1945  Age: 76 y.o. MRN: [id number]  CC:  Chief Complaint  Patient presents with  . Arm Pain    Right arm  . Fever    HPI Christian Kim is here today with concerns.   Pt seen by cardiologist this am and was noted to have a fever of 101 F. He had stent placed tow weeks ago by cardiologist.   He does state increasing fatigue as well as this am started with lower back pain. Chills this am, but did note a fever at the cardiology visit this am. Some sinus pressure with pnd causing productive cough, clear sputum. No chest congestion. No ear pain. Slight sore throat.   Usually doesn't wake up at night to go pee, but woke up twice this am which was unusual for him. No burning when he pees. Does note some urinary urgency.   Back pain is more with aching dull pain that is more constant. Not sharp and stabbing. Denies h/o kidney stones.   Also noted with some right shoulder pain with limitation of movement if he puts it across his chest. Can rise his shoulder without pain.   Ongoing right shoulder pain for the last two months, hard to sleep on right side . Limited movement across his chest.   Past Medical History:  Diagnosis Date  . CAD (coronary artery disease) 2008   stent  . CAP (community acquired pneumonia) 03/07/2015  . Choledocholithiasis    recurrent/multiple ERCPs with stone extraction and sphincterotomy   . CHOLEDOCHOLITHIASIS 09/20/2007   Qualifier: Diagnosis of  By: Roxan Hockey, [place]    . GERD (gastroesophageal reflux disease)   . Gout 2013   dx by crystal analysis  . Hyperlipidemia   . Hypertension   . Myocardial infarction ([place]) 12-2006  . Prostatitis 01/22/2017    Past Surgical History:  Procedure Laterality Date  . CHOLECYSTECTOMY  2006   w/ revision 2/2 persistent gallstones  . COLONOSCOPY  06/2013   1 polyp, rpt 5 yrs [name])  . COLONOSCOPY  11/2019   1  TA, int hem, no f/u needed [name]).   . CORONARY ANGIOPLASTY WITH STENT PLACEMENT  2008   stent, Dr. Hyman Hopes  . CORONARY STENT INTERVENTION N/A 02/19/2022   Procedure: CORONARY STENT INTERVENTION;  Surgeon: Sherren Mocha, MD;  Location: [facility];  Service: Cardiovascular;  Laterality: N/A;  . INTRAVASCULAR PRESSURE WIRE/FFR STUDY N/A 02/19/2022   Procedure: INTRAVASCULAR PRESSURE WIRE/FFR STUDY;  Surgeon: Sherren Mocha, MD;  Location: [facility];  Service: Cardiovascular;  Laterality: N/A;  . INTRAVASCULAR ULTRASOUND/IVUS N/A 02/19/2022   Procedure: Intravascular Ultrasound/IVUS;  Surgeon: Sherren Mocha, MD;  Location: [facility];  Service: Cardiovascular;  Laterality: N/A;  . LEFT HEART CATH AND CORONARY ANGIOGRAPHY N/A 02/19/2022   Procedure: LEFT HEART CATH AND CORONARY ANGIOGRAPHY;  Surgeon: Sherren Mocha, MD;  Location: [facility];  Service: Cardiovascular;  Laterality: N/A;  . NASAL SEPTUM SURGERY      Family History  Problem Relation Age of Onset  . Liver cancer Mother        unknown primary origin  . Lung cancer Mother        smoker  . Colon cancer Neg Hx   . Esophageal cancer Neg Hx   . Stomach cancer Neg Hx   . Rectal cancer Neg Hx     Social History  Socioeconomic History  . Marital status: Married    Spouse name: Not on file  . Number of children: 2  . Years of education: Not on file  . Highest education level: Not on file  Occupational History  . Occupation: retired Academic librarian  . Occupation: Magazine features editor: Retired  Tobacco Use  . Smoking status: Never  . Smokeless tobacco: Never  Vaping Use  . Vaping Use: Never used  Substance and Sexual Activity  . Alcohol use: Yes    Comment: rarely drinks - 1 to 2 beers per month  . Drug use: No  . Sexual activity: Yes  Other Topics Concern  . 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   Activity: works farm to stay active, rabbit hunting   Diet: good water, fruits/vegetables daily   Social Determinants of Radio broadcast assistant Strain: Not on file  Food Insecurity: Not on file  Transportation Needs: Not on file  Physical Activity: Not on file  Stress: Not on file  Social Connections: Not on file  Intimate Partner Violence: Not on file    Outpatient Medications Prior to Visit  Medication Sig Dispense Refill  . acetaminophen (TYLENOL) 500 MG tablet Take 1 tablet (500 mg total) by mouth every 6 (six) hours as needed.    [name] amLODipine (NORVASC) 10 MG tablet TAKE 1 TABLET BY MOUTH ONCE A DAY 90 tablet 2  . aspirin 81 MG tablet Take 81 mg by mouth daily.    . Cholecalciferol (VITAMIN D3) 25 MCG (1000 UT) CAPS Take 1 capsule (1,000 Units total) by mouth daily. 30 capsule   . clopidogrel (PLAVIX) 75 MG tablet Take 1 tablet (75 mg total) by mouth daily. 90 tablet 3  . isosorbide mononitrate (IMDUR) 30 MG 24 hr tablet Take 0.5 tablets (15 mg total) by mouth daily. (Patient taking differently: Take 15 mg by mouth at bedtime.) 45 tablet 3  . lisinopril (ZESTRIL) 40 MG tablet TAKE 1 TABLET BY MOUTH ONCE A DAY 90 tablet 2  . metoprolol succinate (TOPROL-XL) 25 MG 24 hr tablet TAKE 1 TABLET BY MOUTH ONCE A DAY 90 tablet 2  . MITIGARE 0.6 MG CAPS Take 1 capsule by mouth daily as needed (gout flare). Take 2 capsules on the first day of flare 30 capsule 3  . nitroGLYCERIN (NITROSTAT) 0.4 MG SL tablet Place 1 tablet (0.4 mg total) under the tongue every 5 (five) minutes as needed for chest pain. 90 tablet 3  . pantoprazole (PROTONIX) 40 MG tablet Take 1 tablet (40 mg total) by mouth at bedtime. 90 tablet 3  . probenecid (BENEMID) 500 MG tablet TAKE 1/2 A TABLET (250 MG TOTAL) BY MOUTH 2 TIMES DAILY. 30 tablet 11  . rosuvastatin (CRESTOR) 20 MG tablet Take 1 tablet (20 mg total) by mouth daily. 90 tablet 3   . triamcinolone cream (KENALOG) 0.1 % Apply 1 application  topically daily as needed (irritation).    . ursodiol (ACTIGALL) 300 MG capsule Take 2 capsules (600 mg total) by mouth 2 (two) times daily. 360 capsule 3   No facility-administered medications prior to visit.    Allergies  Allergen Reactions  . Allopurinol Rash        Objective:    Physical Exam Vitals reviewed.  Constitutional:  General: He is awake. He is not in acute distress.    Appearance: Normal appearance. He is normal weight. He is not ill-appearing, toxic-appearing or diaphoretic.  HENT:     Right Ear: Hearing, ear canal and external ear normal. A middle ear effusion (clear) is present. Tympanic membrane is not erythematous or bulging.     Left Ear: Hearing, tympanic membrane, ear canal and external ear normal.     Nose: Nose normal.     Right Turbinates: Not enlarged or swollen.     Left Turbinates: Not enlarged or swollen.     Right Sinus: No maxillary sinus tenderness or frontal sinus tenderness.     Left Sinus: No maxillary sinus tenderness or frontal sinus tenderness.     Mouth/Throat:     Mouth: Mucous membranes are moist.     Pharynx: No pharyngeal swelling, oropharyngeal exudate or posterior oropharyngeal erythema.  Eyes:     Extraocular Movements: Extraocular movements intact.     Pupils: Pupils are equal, round, and reactive to light.  Cardiovascular:     Rate and Rhythm: Normal rate and regular rhythm.  Pulmonary:     Effort: Pulmonary effort is normal.     Breath sounds: Normal breath sounds. No wheezing.  Abdominal:     General: Abdomen is flat.     Tenderness: There is no abdominal tenderness. There is left CVA tenderness. There is no right CVA tenderness.  Musculoskeletal:     Right shoulder: No bony tenderness or crepitus. Decreased range of motion (pain with flexion across chest).  Neurological:     General: No focal deficit present.     Mental Status: He is alert and oriented to  person, place, and time. Mental status is at baseline.     Motor: No weakness.     Gait: Gait normal.  Psychiatric:        Mood and Affect: Mood normal.        Behavior: Behavior normal.        Thought Content: Thought content normal.        Judgment: Judgment normal.    BP 120/66   Pulse 77   Temp 100.2 F (37.9 C)   Resp 16   Ht '5\' 10"'  (1.778 m)   Wt 195 lb 2 oz (88.5 kg)   SpO2 97%   BMI 28.00 kg/m  Wt Readings from Last 3 Encounters:  03/06/22 195 lb 2 oz (88.5 kg)  03/06/22 195 lb 12.8 oz (88.8 kg)  02/19/22 190 lb (86.2 kg)     Health Maintenance Due  Topic Date Due  . Zoster Vaccines- Shingrix (1 of 2) Never done  . COVID-19 Vaccine (4 - Pfizer series) 07/13/2020    There are no preventive care reminders to display for this patient.  Lab Results  Component Value Date   TSH 2.83 06/19/2016   Lab Results  Component Value Date   WBC 5.4 02/13/2022   HGB 12.5 (L) 02/13/2022   HCT 38.3 02/13/2022   MCV 81 02/13/2022   PLT 216 02/13/2022   Lab Results  Component Value Date   NA 140 02/13/2022   K 4.5 02/13/2022   CO2 28 02/13/2022   GLUCOSE 91 02/13/2022   BUN 19 02/13/2022   CREATININE 1.57 (H) 02/13/2022   BILITOT 0.6 07/21/2021   ALKPHOS 92 07/21/2021   AST 14 07/21/2021   ALT 14 07/21/2021   PROT 6.7 07/21/2021   ALBUMIN 4.1 01/30/2022   CALCIUM 9.7 02/13/2022  EGFR 45 (L) 02/13/2022   GFR 40.63 (L) 01/30/2022   Lab Results  Component Value Date   HGBA1C  01/07/2007    5.3 (NOTE)   The ADA recommends the following therapeutic goals for glycemic   control related to Hgb A1C measurement:   Goal of Therapy:   < 7.0% Hgb A1C   Action Suggested:  > 8.0% Hgb A1C   Ref:  Diabetes Care, 22, Suppl. 1, 1999      Assessment & Plan:   Problem List Items Addressed This Visit       Respiratory   Strep pharyngitis    Strep tested positive in office.  rx for amox 500 mg po bid x 10 days.  tyelnol prn sore throat/fever Pt told to F/u if no  improvement in the next 2-3 days.       Relevant Medications   amoxicillin (AMOXIL) 500 MG capsule     Other   Acute pain of right shoulder    Xray shoulder ordered Showed pt some exercises to work on  [employer] prn tylenol prn  Exercises sent to [employer] as well.  Pt to f/u with orthopedist if no improvement      Relevant Orders   DG Shoulder Right   Fever    Flu strep and covid in office Strep positive Also ordering cbc and urine culture as pt with urinary freq and flank pain, less likely kidney stone as not sharp stabbing and or waxing waning and reviewed poc urin which was unremarkable. Pending urine culture ordered as well to r/o any chance to uti. Consider enlarged prostate if urine negative as pt with increased frequency, slight.        Relevant Orders   POC Influenza A&B(BINAX/QUICKVUE) (Completed)   CBC with Differential   Comprehensive metabolic panel   Other Visit Diagnoses     Suspected COVID-19 virus infection    -  Primary   Relevant Orders   POC COVID-19 BinaxNow (Completed)   Sore throat       Relevant Orders   POCT rapid strep A (Completed)   Urine frequency       Relevant Orders   Urine Culture   POCT Urinalysis Dipstick (Automated) (Completed)       Meds ordered this encounter  Medications  . amoxicillin (AMOXIL) 500 MG capsule    Sig: Take 1 capsule (500 mg total) by mouth 2 (two) times daily for 10 days.    Dispense:  20 capsule    Refill:  0    Order Specific Question:   Supervising Provider    Answer:   Diona Browner, AMY E [[id number]]    Follow-up: Return for f/u with primary care provider if no improvement.    Eugenia Pancoast, FNP

## 2022-03-06 NOTE — Assessment & Plan Note (Signed)
Atorvastatin was changed to rosuvastatin 20 mg daily in the hospital.  Arrange follow-up CMET, lipids in 6 weeks.

## 2022-03-06 NOTE — Assessment & Plan Note (Signed)
Flu strep and covid in office Strep positive Also ordering cbc and urine culture as pt with urinary freq and flank pain, less likely kidney stone as not sharp stabbing and or waxing waning and reviewed poc urin which was unremarkable. Pending urine culture ordered as well to r/o any chance to uti. Consider enlarged prostate if urine negative as pt with increased frequency, slight.

## 2022-03-06 NOTE — Progress Notes (Signed)
Anemia is stable Lymphoctyes low, neutrophils high might be from strep. Have pt repeat in three weeks lab only appt to ensure returns to normal.  Kidney function slight elevation, have pt increase water intake. Pending urine culture although suspected UTI less likely due to clean urine taken in office.   Strep positive, pt informed in office.  Flu covid negative.

## 2022-03-06 NOTE — Patient Instructions (Signed)
Medication Instructions:  Your physician recommends that you continue on your current medications as directed. Please refer to the Current Medication list given to you today.  *If you need a refill on your cardiac medications before your next appointment, please call your pharmacy*   Lab Work: 04/16/22: COME TO THE OFFICE, ANYTIME AFTER 7:15 AM, FASTING FOR:  CMET & LIPID  If you have labs (blood work) drawn today and your tests are completely normal, you will receive your results only by: [place] (if you have MyChart) OR A paper copy in the mail If you have any lab test that is abnormal or we need to change your treatment, we will call you to review the results.   Testing/Procedures: None ordered   Follow-Up: At [facility], you and your health needs are our priority.  As part of our continuing mission to provide you with exceptional heart care, we have created designated Provider Care Teams.  These Care Teams include your primary Cardiologist (physician) and Advanced Practice Providers (APPs -  Physician Assistants and Nurse Practitioners) who all work together to provide you with the care you need, when you need it.  We recommend signing up for the patient portal called "MyChart".  Sign up information is provided on this After Visit Summary.  MyChart is used to connect with patients for Virtual Visits (Telemedicine).  Patients are able to view lab/test results, encounter notes, upcoming appointments, etc.  Non-urgent messages can be sent to your provider as well.   To learn more about what you can do with MyChart, go to [email or web address].    Your next appointment:   6 month(s)  The format for your next appointment:   In Person  Provider:   Sherren Mocha, MD     Other Instructions We have scheduled you an acute appointment at Dr. Synthia Innocent office today at 12:20.  You will see Dr. Synthia Innocent NP.  Important Information About Sugar

## 2022-03-06 NOTE — Telephone Encounter (Signed)
Called patient per Dr. Danise Mina. Patient is on scheduled to see Eugenia Pancoast NP today 03/06/22 at 12:20 pm.  Patient stated that he was told that he has a fever today of 101.1 while at his cardiologist office Patient stated that he started with low back pain this morning. Patient denies burning with urination. Patient stated that he is urinating more than usual. Patient stated that he went to the bathroom 2-3 times during the night and that is not normal for him. Patient stated that he has had a cough for a week or so but that is due to sinus drainage. Patient denies any other cold like symptoms at this time.

## 2022-03-06 NOTE — Assessment & Plan Note (Signed)
Xray shoulder ordered Showed pt some exercises to work on  [employer] prn tylenol prn  Exercises sent to [employer] as well.  Pt to f/u with orthopedist if no improvement

## 2022-03-06 NOTE — Telephone Encounter (Signed)
Saw patient already, thank you for speaking with the patient.  Please see note for further information.  

## 2022-03-07 LAB — URINE CULTURE
MICRO NUMBER:: 14031510
Result:: NO GROWTH
SPECIMEN QUALITY:: ADEQUATE

## 2022-03-07 NOTE — Progress Notes (Signed)
Shoulder xray addressed under xray result. Cxr was not done.

## 2022-03-25 ENCOUNTER — Other Ambulatory Visit: Payer: Self-pay | Admitting: Family Medicine

## 2022-03-28 ENCOUNTER — Other Ambulatory Visit (INDEPENDENT_AMBULATORY_CARE_PROVIDER_SITE_OTHER): Payer: Medicare PPO

## 2022-03-28 DIAGNOSIS — D72810 Lymphocytopenia: Secondary | ICD-10-CM

## 2022-03-28 LAB — CBC WITH DIFFERENTIAL/PLATELET
Basophils Absolute: 0 10*3/uL (ref 0.0–0.1)
Basophils Relative: 0.4 % (ref 0.0–3.0)
Eosinophils Absolute: 0.1 10*3/uL (ref 0.0–0.7)
Eosinophils Relative: 2.8 % (ref 0.0–5.0)
HCT: 36.3 % — ABNORMAL LOW (ref 39.0–52.0)
Hemoglobin: 12 g/dL — ABNORMAL LOW (ref 13.0–17.0)
Lymphocytes Relative: 24.9 % (ref 12.0–46.0)
Lymphs Abs: 1.2 10*3/uL (ref 0.7–4.0)
MCHC: 33.1 g/dL (ref 30.0–36.0)
MCV: 82.9 fl (ref 78.0–100.0)
Monocytes Absolute: 0.6 10*3/uL (ref 0.1–1.0)
Monocytes Relative: 12.1 % — ABNORMAL HIGH (ref 3.0–12.0)
Neutro Abs: 2.9 10*3/uL (ref 1.4–7.7)
Neutrophils Relative %: 59.8 % (ref 43.0–77.0)
Platelets: 180 10*3/uL (ref 150.0–400.0)
RBC: 4.38 Mil/uL (ref 4.22–5.81)
RDW: 15.9 % — ABNORMAL HIGH (ref 11.5–15.5)
WBC: 4.8 10*3/uL (ref 4.0–10.5)

## 2022-04-16 ENCOUNTER — Ambulatory Visit: Payer: Medicare PPO

## 2022-04-17 DIAGNOSIS — Z135 Encounter for screening for eye and ear disorders: Secondary | ICD-10-CM | POA: Diagnosis not present

## 2022-04-17 DIAGNOSIS — H04123 Dry eye syndrome of bilateral lacrimal glands: Secondary | ICD-10-CM | POA: Diagnosis not present

## 2022-04-17 DIAGNOSIS — H524 Presbyopia: Secondary | ICD-10-CM | POA: Diagnosis not present

## 2022-04-17 DIAGNOSIS — H2513 Age-related nuclear cataract, bilateral: Secondary | ICD-10-CM | POA: Diagnosis not present

## 2022-04-17 DIAGNOSIS — H5203 Hypermetropia, bilateral: Secondary | ICD-10-CM | POA: Diagnosis not present

## 2022-04-17 DIAGNOSIS — H52223 Regular astigmatism, bilateral: Secondary | ICD-10-CM | POA: Diagnosis not present

## 2022-04-18 ENCOUNTER — Ambulatory Visit: Payer: Medicare PPO | Attending: Physician Assistant

## 2022-04-18 DIAGNOSIS — E782 Mixed hyperlipidemia: Secondary | ICD-10-CM | POA: Diagnosis not present

## 2022-04-18 DIAGNOSIS — I10 Essential (primary) hypertension: Secondary | ICD-10-CM

## 2022-04-19 LAB — COMPREHENSIVE METABOLIC PANEL
ALT: 20 IU/L (ref 0–44)
AST: 16 IU/L (ref 0–40)
Albumin/Globulin Ratio: 1.7 (ref 1.2–2.2)
Albumin: 4.6 g/dL (ref 3.8–4.8)
Alkaline Phosphatase: 119 IU/L (ref 44–121)
BUN/Creatinine Ratio: 10 (ref 10–24)
BUN: 15 mg/dL (ref 8–27)
Bilirubin Total: 0.5 mg/dL (ref 0.0–1.2)
CO2: 26 mmol/L (ref 20–29)
Calcium: 9.2 mg/dL (ref 8.6–10.2)
Chloride: 103 mmol/L (ref 96–106)
Creatinine, Ser: 1.55 mg/dL — ABNORMAL HIGH (ref 0.76–1.27)
Globulin, Total: 2.7 g/dL (ref 1.5–4.5)
Glucose: 94 mg/dL (ref 70–99)
Potassium: 4.5 mmol/L (ref 3.5–5.2)
Sodium: 142 mmol/L (ref 134–144)
Total Protein: 7.3 g/dL (ref 6.0–8.5)
eGFR: 46 mL/min/{1.73_m2} — ABNORMAL LOW (ref >59)

## 2022-04-19 LAB — LIPID PANEL
Chol/HDL Ratio: 3.1 ratio (ref 0.0–5.0)
Cholesterol, Total: 113 mg/dL (ref 100–199)
HDL: 36 mg/dL — ABNORMAL LOW (ref >39)
LDL Chol Calc (NIH): 47 mg/dL (ref 0–99)
Triglycerides: 182 mg/dL — ABNORMAL HIGH (ref 0–149)
VLDL Cholesterol Cal: 30 mg/dL (ref 5–40)

## 2022-05-12 ENCOUNTER — Other Ambulatory Visit: Payer: Self-pay | Admitting: Physician Assistant

## 2022-05-15 ENCOUNTER — Other Ambulatory Visit (HOSPITAL_COMMUNITY): Payer: Self-pay

## 2022-05-17 ENCOUNTER — Other Ambulatory Visit: Payer: Self-pay | Admitting: Physician Assistant

## 2022-05-17 NOTE — Telephone Encounter (Signed)
Please advise. Pt's last visit was 04/2021.

## 2022-06-13 ENCOUNTER — Other Ambulatory Visit: Payer: Self-pay | Admitting: Physician Assistant

## 2022-06-14 ENCOUNTER — Ambulatory Visit: Payer: Medicare PPO | Admitting: Physician Assistant

## 2022-06-14 ENCOUNTER — Encounter: Payer: Self-pay | Admitting: Physician Assistant

## 2022-06-14 VITALS — BP 140/70 | HR 59 | Ht 70.5 in | Wt 198.5 lb

## 2022-06-14 DIAGNOSIS — K8050 Calculus of bile duct without cholangitis or cholecystitis without obstruction: Secondary | ICD-10-CM

## 2022-06-14 DIAGNOSIS — K219 Gastro-esophageal reflux disease without esophagitis: Secondary | ICD-10-CM | POA: Diagnosis not present

## 2022-06-14 MED ORDER — URSODIOL 300 MG PO CAPS: 600.0000 mg | ORAL_CAPSULE | Freq: Two times a day (BID) | ORAL | 2 refills | Status: DC

## 2022-06-14 NOTE — Patient Instructions (Signed)
We have sent the following medications to your pharmacy for you to pick up at your convenience: Ursodiol   _______________________________________________________  If your blood pressure at your visit was 140/90 or greater, please contact your primary care physician to follow up on this.  _______________________________________________________  If you are age 77 or older, your body mass index should be between 23-30. Your Body mass index is 28.08 kg/m. If this is out of the aforementioned range listed, please consider follow up with your Primary Care Provider.  If you are age 86 or younger, your body mass index should be between 19-25. Your Body mass index is 28.08 kg/m. If this is out of the aformentioned range listed, please consider follow up with your Primary Care Provider.   ________________________________________________________  The [facility] providers would like to encourage you to use [facility] to communicate with providers for non-urgent requests or questions.  Due to long hold times on the telephone, sending your provider a message by [facility] may be a faster and more efficient way to get a response.  Please allow 48 business hours for a response.  Please remember that this is for non-urgent requests.  _______________________________________________________  Thank you for choosing me and [facility].  Ellouise Newer PA-C

## 2022-06-14 NOTE — Progress Notes (Signed)
Chief Complaint: Refill of Ursodiol  HPI:    [name] is a 77 year old male with a past medical history as listed below including choledocholithiasis now maintained on Ursodiol known to Dr. [name], who presents to clinic today for refill of his Ursodiol. 06/2013 colonoscopy with a sessile serrated polyp.  Repeat recommended in 5 years.  At that time recommend a repeat colonoscopy in February 2020.    08/22/2017 patient seen in clinic by Dr. [name].  At that time discussed he had a history of chronic GERD for which he was maintained on a PPI.  Also history of recurrent choledocholithiasis for which she was status postcholecystectomy and repeat ERCPs with common duct clearance.  Given her current problems he was placed on Ursodiol.  Since initiating Ursodiol he had had no further recurrence of his common duct stones.  He was continued on Ursodiol 600 mg twice daily and Omeprazole 20 mg daily.    12/22/2019 colonoscopy with a 2 mm polyp in transverse colon and internal hemorrhoids.  Pathology showed tubular adenoma and repeat was not recommended due to age.    07/15/2020 CMP with normal LFTs.    04/27/2021 patient seen in clinic and described doing well on Ursodiol 600 mg twice a day.  He described occasional episodes of epigastric pain/gastritis immediately after eating which lasted for about 10 minutes then went away on its own.  He continued on Omeprazole 20 mg daily.  At that time repeated hepatic function panel to monitor liver enzymes given Ursodiol use and refilled Ursodiol 600 mg twice daily as well as Omeprazole 20 mg daily.    04/18/2022 CMP normal.    Today, the patient presents to clinic and tells me he is feeling very well, he had gone to the hospital in September for a catheterization and they actually placed 2 stents at the time.  He denies any other complaints or concerns.  Apparently had an acute episode of epigastric pain which felt similar to prior stones he had passed a night or 2 ago but  this only lasted for a minute and then went away.  He is very happy with the Ursodiol.  He does tell me his PCP changed his PPI and is working well for him.  He does not need [place] to refill that.    Denies fever, chills, weight loss, blood in his stool, nausea, vomiting, heartburn or reflux.  Past Medical History:  Diagnosis Date   CAD (coronary artery disease) 2008   stent   CAP (community acquired pneumonia) 03/07/2015   Choledocholithiasis    recurrent/multiple ERCPs with stone extraction and sphincterotomy    CHOLEDOCHOLITHIASIS 09/20/2007   Qualifier: Diagnosis of  By: Roxan Hockey, [place]     GERD (gastroesophageal reflux disease)    Gout 2013   dx by crystal analysis   Hyperlipidemia    Hypertension    Myocardial infarction [facility]) 12-2006   Prostatitis 01/22/2017    Past Surgical History:  Procedure Laterality Date   CHOLECYSTECTOMY  2006   w/ revision 2/2 persistent gallstones   COLONOSCOPY  06/2013   1 polyp, rpt 5 yrs [name])   COLONOSCOPY  11/2019   1 TA, int hem, no f/u needed [name]).    CORONARY ANGIOPLASTY WITH STENT PLACEMENT  2008   stent, Dr. Hyman Hopes   CORONARY STENT INTERVENTION N/A 02/19/2022   Procedure: CORONARY STENT INTERVENTION;  Surgeon: Sherren Mocha, MD;  Location: [place] [facility];  Service: Cardiovascular;  Laterality: N/A;   INTRAVASCULAR PRESSURE  WIRE/FFR STUDY N/A 02/19/2022   Procedure: INTRAVASCULAR PRESSURE WIRE/FFR STUDY;  Surgeon: Sherren Mocha, MD;  Location: [place] [facility];  Service: Cardiovascular;  Laterality: N/A;   INTRAVASCULAR ULTRASOUND/IVUS N/A 02/19/2022   Procedure: Intravascular Ultrasound/IVUS;  Surgeon: Sherren Mocha, MD;  Location: [place] [facility];  Service: Cardiovascular;  Laterality: N/A;   LEFT HEART CATH AND CORONARY ANGIOGRAPHY N/A 02/19/2022   Procedure: LEFT HEART CATH AND CORONARY ANGIOGRAPHY;  Surgeon: Sherren Mocha, MD;  Location: [place] [facility];  Service: Cardiovascular;  Laterality: N/A;   NASAL  SEPTUM SURGERY      Current Outpatient Medications  Medication Sig Dispense Refill   acetaminophen (TYLENOL) 500 MG tablet Take 1 tablet (500 mg total) by mouth every 6 (six) hours as needed.     amLODipine (NORVASC) 10 MG tablet TAKE 1 TABLET BY MOUTH ONCE A DAY 90 tablet 2   aspirin 81 MG tablet Take 81 mg by mouth daily.     Cholecalciferol (VITAMIN D3) 25 MCG (1000 UT) CAPS Take 1 capsule (1,000 Units total) by mouth daily. 30 capsule    clopidogrel (PLAVIX) 75 MG tablet Take 1 tablet (75 mg total) by mouth daily. 90 tablet 3   isosorbide mononitrate (IMDUR) 30 MG 24 hr tablet Take 0.5 tablets (15 mg total) by mouth daily. (Patient taking differently: Take 15 mg by mouth at bedtime.) 45 tablet 3   lisinopril (ZESTRIL) 40 MG tablet TAKE 1 TABLET BY MOUTH ONCE A DAY 90 tablet 2   metoprolol succinate (TOPROL-XL) 25 MG 24 hr tablet TAKE 1 TABLET BY MOUTH ONCE A DAY 90 tablet 2   MITIGARE 0.6 MG CAPS Take 1 capsule by mouth daily as needed (gout flare). Take 2 capsules on the first day of flare 30 capsule 3   omeprazole (PRILOSEC) 20 MG capsule TAKE 1 CAPSULE BY MOUTH ONCE DAILY 30 capsule 0   pantoprazole (PROTONIX) 40 MG tablet Take 1 tablet (40 mg total) by mouth at bedtime. 90 tablet 3   probenecid (BENEMID) 500 MG tablet TAKE 1/2 A TABLET (250 MG TOTAL) BY MOUTH 2 TIMES DAILY. 30 tablet 11   rosuvastatin (CRESTOR) 20 MG tablet Take 1 tablet (20 mg total) by mouth daily. 90 tablet 3   triamcinolone cream (KENALOG) 0.1 % Apply 1 application  topically daily as needed (irritation).     ursodiol (ACTIGALL) 300 MG capsule Take 2 capsules (600 mg total) by mouth 2 (two) times daily. Patient needs office  visit 360 capsule 0   nitroGLYCERIN (NITROSTAT) 0.4 MG SL tablet Place 1 tablet (0.4 mg total) under the tongue every 5 (five) minutes as needed for chest pain. 90 tablet 3   No current facility-administered medications for this visit.    Allergies as of 06/14/2022 - Review Complete  06/14/2022  Allergen Reaction Noted   Allopurinol Rash 03/04/2012    Family History  Problem Relation Age of Onset   Liver cancer Mother        unknown primary origin   Lung cancer Mother        smoker   Colon cancer Neg Hx    Esophageal cancer Neg Hx    Stomach cancer Neg Hx    Rectal cancer Neg Hx     Social History   Socioeconomic History   Marital status: Married    Spouse name: Not on file   Number of children: 2   Years of education: Not on file   Highest education level: Not on file  Occupational History   Occupation: retired Academic librarian   Occupation: Magazine features editor: Retired  Tobacco Use   Smoking status: Never   Smokeless tobacco: Never  Scientific laboratory technician Use: Never used  Substance and Sexual Activity   Alcohol use: Yes    Comment: rarely drinks - 1 to 2 beers per month   Drug use: No   Sexual activity: Yes  Other Topics Concern   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   Activity: works farm to stay active, rabbit hunting   Diet: good water, fruits/vegetables daily   Social Determinants of Radio broadcast assistant Strain: Not on file  Food Insecurity: Not on file  Transportation Needs: Not on file  Physical Activity: Not on file  Stress: Not on file  Social Connections: Not on file  Intimate Partner Violence: Not on file    Review of Systems:    Constitutional: No weight loss, fever or chills Cardiovascular: No chest pain   Respiratory: No SOB  Gastrointestinal: See HPI and otherwise negative   Physical Exam:  Vital signs: BP (!) 140/70   Pulse (!) 59   Ht 5' 10.5" (1.791 m)   Wt 198 lb 8 oz (90 kg)   BMI 28.08 kg/m    Constitutional:   Pleasant Caucasian male appears to be in NAD, Well developed, Well nourished, alert and cooperative Respiratory: Respirations even and  unlabored. Lungs clear to auscultation bilaterally.   No wheezes, crackles, or rhonchi.  Cardiovascular: Normal S1, S2. No MRG. Regular rate and rhythm. No peripheral edema, cyanosis or pallor.  Gastrointestinal:  Soft, nondistended, nontender. No rebound or guarding. Normal bowel sounds. No appreciable masses or hepatomegaly. Rectal:  Not performed.  Psychiatric: Demonstrates good judgement and reason without abnormal affect or behaviors.  RELEVANT LABS AND IMAGING: CBC    Component Value Date/Time   WBC 4.8 03/28/2022 1127   RBC 4.38 03/28/2022 1127   HGB 12.0 (L) 03/28/2022 1127   HGB 12.5 (L) 02/13/2022 1205   HCT 36.3 (L) 03/28/2022 1127   HCT 38.3 02/13/2022 1205   PLT 180.0 03/28/2022 1127   PLT 216 02/13/2022 1205   MCV 82.9 03/28/2022 1127   MCV 81 02/13/2022 1205   MCH 26.5 (L) 02/13/2022 1205   MCHC 33.1 03/28/2022 1127   RDW 15.9 (H) 03/28/2022 1127   RDW 17.5 (H) 02/13/2022 1205   LYMPHSABS 1.2 03/28/2022 1127   MONOABS 0.6 03/28/2022 1127   EOSABS 0.1 03/28/2022 1127   BASOSABS 0.0 03/28/2022 1127    CMP     Component Value Date/Time   NA 142 04/18/2022 1011   K 4.5 04/18/2022 1011   CL 103 04/18/2022 1011   CO2 26 04/18/2022 1011   GLUCOSE 94 04/18/2022 1011   GLUCOSE 92 03/06/2022 1316   BUN 15 04/18/2022 1011   CREATININE 1.55 (H) 04/18/2022 1011   CALCIUM 9.2 04/18/2022 1011   PROT 7.3 04/18/2022 1011   ALBUMIN 4.6 04/18/2022 1011   AST 16 04/18/2022 1011   ALT 20 04/18/2022 1011   ALKPHOS 119 04/18/2022 1011   BILITOT 0.5 04/18/2022 1011   GFRNONAA 45.05 04/14/2010 0811   GFRAA 72 11/03/2007 0838    Assessment: 1.  History of choledocholithiasis: Maintained on Ursodiol 600 mg twice daily and doing well 2.  GERD: Controlled on a PPI   Plan: 1.  Discussed with patient that he can continue to get his PPI from his PCP as long as he is not having any problems.  Refilled his Ursodiol 600 mg twice daily #180 with 3 refills.  He can have another  yearof refills if he is doing well but would need to be seen back in clinic in 2 years for further refills. 2.  Patient to follow in clinic with [place] as needed.  Ellouise Newer, PA-C [facility] 06/14/2022, 2:26 PM  Cc: Ria Bush, MD

## 2022-06-14 NOTE — Progress Notes (Signed)
Noted  

## 2022-07-02 ENCOUNTER — Other Ambulatory Visit (HOSPITAL_BASED_OUTPATIENT_CLINIC_OR_DEPARTMENT_OTHER): Payer: Self-pay

## 2022-07-22 ENCOUNTER — Other Ambulatory Visit: Payer: Self-pay | Admitting: Family Medicine

## 2022-07-22 DIAGNOSIS — D509 Iron deficiency anemia, unspecified: Secondary | ICD-10-CM

## 2022-07-22 DIAGNOSIS — N1832 Chronic kidney disease, stage 3b: Secondary | ICD-10-CM

## 2022-07-22 DIAGNOSIS — E559 Vitamin D deficiency, unspecified: Secondary | ICD-10-CM

## 2022-07-22 DIAGNOSIS — M1A9XX0 Chronic gout, unspecified, without tophus (tophi): Secondary | ICD-10-CM

## 2022-07-22 DIAGNOSIS — E782 Mixed hyperlipidemia: Secondary | ICD-10-CM

## 2022-07-23 ENCOUNTER — Other Ambulatory Visit (INDEPENDENT_AMBULATORY_CARE_PROVIDER_SITE_OTHER): Payer: Medicare PPO

## 2022-07-23 DIAGNOSIS — E782 Mixed hyperlipidemia: Secondary | ICD-10-CM

## 2022-07-23 DIAGNOSIS — N1832 Chronic kidney disease, stage 3b: Secondary | ICD-10-CM | POA: Diagnosis not present

## 2022-07-23 DIAGNOSIS — E559 Vitamin D deficiency, unspecified: Secondary | ICD-10-CM | POA: Diagnosis not present

## 2022-07-23 DIAGNOSIS — M1A9XX0 Chronic gout, unspecified, without tophus (tophi): Secondary | ICD-10-CM | POA: Diagnosis not present

## 2022-07-23 DIAGNOSIS — D509 Iron deficiency anemia, unspecified: Secondary | ICD-10-CM

## 2022-07-23 LAB — COMPREHENSIVE METABOLIC PANEL
ALT: 25 U/L (ref 0–53)
AST: 17 U/L (ref 0–37)
Albumin: 4 g/dL (ref 3.5–5.2)
Alkaline Phosphatase: 120 U/L — ABNORMAL HIGH (ref 39–117)
BUN: 19 mg/dL (ref 6–23)
CO2: 29 mEq/L (ref 19–32)
Calcium: 9.6 mg/dL (ref 8.4–10.5)
Chloride: 104 mEq/L (ref 96–112)
Creatinine, Ser: 1.46 mg/dL (ref 0.40–1.50)
GFR: 46.22 mL/min — ABNORMAL LOW (ref >60.00)
Glucose, Bld: 91 mg/dL (ref 70–99)
Potassium: 4.4 mEq/L (ref 3.5–5.1)
Sodium: 141 mEq/L (ref 135–145)
Total Bilirubin: 0.6 mg/dL (ref 0.2–1.2)
Total Protein: 7 g/dL (ref 6.0–8.3)

## 2022-07-23 LAB — LIPID PANEL
Cholesterol: 111 mg/dL (ref 0–200)
HDL: 32 mg/dL — ABNORMAL LOW (ref >39.00)
LDL Cholesterol: 46 mg/dL (ref 0–99)
NonHDL: 79.31
Total CHOL/HDL Ratio: 3
Triglycerides: 167 mg/dL — ABNORMAL HIGH (ref 0.0–149.0)
VLDL: 33.4 mg/dL (ref 0.0–40.0)

## 2022-07-23 LAB — CBC WITH DIFFERENTIAL/PLATELET
Basophils Absolute: 0 10*3/uL (ref 0.0–0.1)
Basophils Relative: 0.5 % (ref 0.0–3.0)
Eosinophils Absolute: 0.3 10*3/uL (ref 0.0–0.7)
Eosinophils Relative: 4.8 % (ref 0.0–5.0)
HCT: 38.6 % — ABNORMAL LOW (ref 39.0–52.0)
Hemoglobin: 12.6 g/dL — ABNORMAL LOW (ref 13.0–17.0)
Lymphocytes Relative: 15.1 % (ref 12.0–46.0)
Lymphs Abs: 1.1 10*3/uL (ref 0.7–4.0)
MCHC: 32.7 g/dL (ref 30.0–36.0)
MCV: 81.8 fl (ref 78.0–100.0)
Monocytes Absolute: 0.6 10*3/uL (ref 0.1–1.0)
Monocytes Relative: 9.1 % (ref 3.0–12.0)
Neutro Abs: 4.9 10*3/uL (ref 1.4–7.7)
Neutrophils Relative %: 70.5 % (ref 43.0–77.0)
Platelets: 213 10*3/uL (ref 150.0–400.0)
RBC: 4.71 Mil/uL (ref 4.22–5.81)
RDW: 16.2 % — ABNORMAL HIGH (ref 11.5–15.5)
WBC: 7 10*3/uL (ref 4.0–10.5)

## 2022-07-23 LAB — MICROALBUMIN / CREATININE URINE RATIO
Creatinine,U: 192.8 mg/dL
Microalb Creat Ratio: 0.5 mg/g (ref 0.0–30.0)
Microalb, Ur: 0.9 mg/dL (ref 0.0–1.9)

## 2022-07-23 LAB — PHOSPHORUS: Phosphorus: 3.6 mg/dL (ref 2.3–4.6)

## 2022-07-23 LAB — IBC PANEL
Iron: 44 ug/dL (ref 42–165)
Saturation Ratios: 8.4 % — ABNORMAL LOW (ref 20.0–50.0)
TIBC: 522.2 ug/dL — ABNORMAL HIGH (ref 250.0–450.0)
Transferrin: 373 mg/dL — ABNORMAL HIGH (ref 212.0–360.0)

## 2022-07-23 LAB — URIC ACID: Uric Acid, Serum: 5.3 mg/dL (ref 4.0–7.8)

## 2022-07-23 LAB — FERRITIN: Ferritin: 12.1 ng/mL — ABNORMAL LOW (ref 22.0–322.0)

## 2022-07-23 LAB — VITAMIN D 25 HYDROXY (VIT D DEFICIENCY, FRACTURES): VITD: 37.82 ng/mL (ref 30.00–100.00)

## 2022-07-24 LAB — PARATHYROID HORMONE, INTACT (NO CA): PTH: 29 pg/mL (ref 16–77)

## 2022-07-30 ENCOUNTER — Ambulatory Visit (INDEPENDENT_AMBULATORY_CARE_PROVIDER_SITE_OTHER): Payer: Medicare PPO | Admitting: Family Medicine

## 2022-07-30 ENCOUNTER — Encounter: Payer: Self-pay | Admitting: Family Medicine

## 2022-07-30 ENCOUNTER — Ambulatory Visit (INDEPENDENT_AMBULATORY_CARE_PROVIDER_SITE_OTHER): Payer: Medicare PPO

## 2022-07-30 VITALS — Ht 70.5 in | Wt 198.0 lb

## 2022-07-30 VITALS — BP 132/72 | HR 53 | Temp 97.4°F | Ht 69.0 in | Wt 188.5 lb

## 2022-07-30 DIAGNOSIS — E782 Mixed hyperlipidemia: Secondary | ICD-10-CM | POA: Diagnosis not present

## 2022-07-30 DIAGNOSIS — I10 Essential (primary) hypertension: Secondary | ICD-10-CM

## 2022-07-30 DIAGNOSIS — K219 Gastro-esophageal reflux disease without esophagitis: Secondary | ICD-10-CM

## 2022-07-30 DIAGNOSIS — M1A9XX0 Chronic gout, unspecified, without tophus (tophi): Secondary | ICD-10-CM

## 2022-07-30 DIAGNOSIS — Z7189 Other specified counseling: Secondary | ICD-10-CM

## 2022-07-30 DIAGNOSIS — Z0000 Encounter for general adult medical examination without abnormal findings: Secondary | ICD-10-CM | POA: Diagnosis not present

## 2022-07-30 DIAGNOSIS — G629 Polyneuropathy, unspecified: Secondary | ICD-10-CM | POA: Insufficient documentation

## 2022-07-30 DIAGNOSIS — D509 Iron deficiency anemia, unspecified: Secondary | ICD-10-CM | POA: Diagnosis not present

## 2022-07-30 DIAGNOSIS — N1832 Chronic kidney disease, stage 3b: Secondary | ICD-10-CM | POA: Diagnosis not present

## 2022-07-30 DIAGNOSIS — I2510 Atherosclerotic heart disease of native coronary artery without angina pectoris: Secondary | ICD-10-CM

## 2022-07-30 DIAGNOSIS — G6289 Other specified polyneuropathies: Secondary | ICD-10-CM

## 2022-07-30 DIAGNOSIS — E559 Vitamin D deficiency, unspecified: Secondary | ICD-10-CM

## 2022-07-30 MED ORDER — AMLODIPINE BESYLATE 10 MG PO TABS: 10.0000 mg | ORAL_TABLET | Freq: Every day | ORAL | 4 refills | Status: DC

## 2022-07-30 MED ORDER — IRON (FERROUS SULFATE) 325 (65 FE) MG PO TABS: 325.0000 mg | ORAL_TABLET | ORAL | Status: AC

## 2022-07-30 MED ORDER — CYANOCOBALAMIN 500 MCG PO TABS: 500.0000 ug | ORAL_TABLET | Freq: Every day | ORAL | Status: AC

## 2022-07-30 MED ORDER — PROBENECID 500 MG PO TABS: ORAL_TABLET | ORAL | 4 refills | Status: DC

## 2022-07-30 MED ORDER — LISINOPRIL 40 MG PO TABS: 40.0000 mg | ORAL_TABLET | Freq: Every day | ORAL | 4 refills | Status: DC

## 2022-07-30 MED ORDER — METOPROLOL SUCCINATE ER 25 MG PO TB24: 25.0000 mg | ORAL_TABLET | Freq: Every day | ORAL | 4 refills | Status: DC

## 2022-07-30 NOTE — Patient Instructions (Addendum)
Christian Kim , Thank you for taking time to come for your Medicare Wellness Visit. I appreciate your ongoing commitment to your health goals. Please review the following plan we discussed and let me know if I can assist you in the future.   These are the goals we discussed:    This is a list of the screening recommended for you and due dates:  Health Maintenance  Topic Date Due   COVID-19 Vaccine (4 - 2023-24 season) 08/15/2022*   Zoster (Shingles) Vaccine (1 of 2) 10/30/2022*   Medicare Annual Wellness Visit  07/30/2023   Colon Cancer Screening  12/21/2024   DTaP/Tdap/Td vaccine (3 - Td or Tdap) 06/13/2025   Pneumonia Vaccine  Completed   Flu Shot  Completed   Hepatitis C Screening: USPSTF Recommendation to screen - Ages 18-79 yo.  Completed   HPV Vaccine  Aged Out  *Topic was postponed. The date shown is not the original due date.    Advanced directives: End of life planning; Advance aging; Advanced directives discussed.  Copy of current HCPOA/Living Will requested.    Next appointment: Follow up in one year for your annual wellness visit.   Preventive Care 43 Years and Older, Male  Preventive care refers to lifestyle choices and visits with your health care provider that can promote health and wellness. What does preventive care include? A yearly physical exam. This is also called an annual well check. Dental exams once or twice a year. Routine eye exams. Ask your health care provider how often you should have your eyes checked. Personal lifestyle choices, including: Daily care of your teeth and gums. Regular physical activity. Eating a healthy diet. Avoiding tobacco and drug use. Limiting alcohol use. Practicing safe sex. Taking low doses of aspirin every day. Taking vitamin and mineral supplements as recommended by your health care provider. What happens during an annual well check? The services and screenings done by your health care provider during your annual well  check will depend on your age, overall health, lifestyle risk factors, and family history of disease. Counseling  Your health care provider may ask you questions about your: Alcohol use. Tobacco use. Drug use. Emotional well-being. Home and relationship well-being. Sexual activity. Eating habits. History of falls. Memory and ability to understand (cognition). Work and work Statistician. Screening  You may have the following tests or measurements: Height, weight, and BMI. Blood pressure. Lipid and cholesterol levels. These may be checked every 5 years, or more frequently if you are over 76 years old. Skin check. Lung cancer screening. You may have this screening every year starting at age 77 if you have a 30-pack-year history of smoking and currently smoke or have quit within the past 15 years. Fecal occult blood test (FOBT) of the stool. You may have this test every year starting at age 77. Flexible sigmoidoscopy or colonoscopy. You may have a sigmoidoscopy every 5 years or a colonoscopy every 10 years starting at age 77. Prostate cancer screening. Recommendations will vary depending on your family history and other risks. Hepatitis C blood test. Hepatitis B blood test. Sexually transmitted disease (STD) testing. Diabetes screening. This is done by checking your blood sugar (glucose) after you have not eaten for a while (fasting). You may have this done every 1-3 years. Abdominal aortic aneurysm (AAA) screening. You may need this if you are a current or former smoker. Osteoporosis. You may be screened starting at age 60 if you are at high risk. Talk with your health  care provider about your test results, treatment options, and if necessary, the need for more tests. Vaccines  Your health care provider may recommend certain vaccines, such as: Influenza vaccine. This is recommended every year. Tetanus, diphtheria, and acellular pertussis (Tdap, Td) vaccine. You may need a Td booster  every 10 years. Zoster vaccine. You may need this after age 77. Pneumococcal 13-valent conjugate (PCV13) vaccine. One dose is recommended after age 77. Pneumococcal polysaccharide (PPSV23) vaccine. One dose is recommended after age 77. Talk to your health care provider about which screenings and vaccines you need and how often you need them. This information is not intended to replace advice given to you by your health care provider. Make sure you discuss any questions you have with your health care provider. Document Released: 06/10/2015 Document Revised: 02/01/2016 Document Reviewed: 03/15/2015 Elsevier Interactive Patient Education  2017 [place] Prevention in the Home Falls can cause injuries. They can happen to people of all ages. There are many things you can do to make your home safe and to help prevent falls. What can I do on the outside of my home? Regularly fix the edges of walkways and driveways and fix any cracks. Remove anything that might make you trip as you walk through a door, such as a raised step or threshold. Trim any bushes or trees on the path to your home. Use bright outdoor lighting. Clear any walking paths of anything that might make someone trip, such as rocks or tools. Regularly check to see if handrails are loose or broken. Make sure that both sides of any steps have handrails. Any raised decks and porches should have guardrails on the edges. Have any leaves, snow, or ice cleared regularly. Use sand or salt on walking paths during winter. Clean up any spills in your garage right away. This includes oil or grease spills. What can I do in the bathroom? Use night lights. Install grab bars by the toilet and in the tub and shower. Do not use towel bars as grab bars. Use non-skid mats or decals in the tub or shower. If you need to sit down in the shower, use a plastic, non-slip stool. Keep the floor dry. Clean up any water that spills on the floor as soon  as it happens. Remove soap buildup in the tub or shower regularly. Attach bath mats securely with double-sided non-slip rug tape. Do not have throw rugs and other things on the floor that can make you trip. What can I do in the bedroom? Use night lights. Make sure that you have a light by your bed that is easy to reach. Do not use any sheets or blankets that are too big for your bed. They should not hang down onto the floor. Have a firm chair that has side arms. You can use this for support while you get dressed. Do not have throw rugs and other things on the floor that can make you trip. What can I do in the kitchen? Clean up any spills right away. Avoid walking on wet floors. Keep items that you use a lot in easy-to-reach places. If you need to reach something above you, use a strong step stool that has a grab bar. Keep electrical cords out of the way. Do not use floor polish or wax that makes floors slippery. If you must use wax, use non-skid floor wax. Do not have throw rugs and other things on the floor that can make you trip. What can  I do with my stairs? Do not leave any items on the stairs. Make sure that there are handrails on both sides of the stairs and use them. Fix handrails that are broken or loose. Make sure that handrails are as long as the stairways. Check any carpeting to make sure that it is firmly attached to the stairs. Fix any carpet that is loose or worn. Avoid having throw rugs at the top or bottom of the stairs. If you do have throw rugs, attach them to the floor with carpet tape. Make sure that you have a light switch at the top of the stairs and the bottom of the stairs. If you do not have them, ask someone to add them for you. What else can I do to help prevent falls? Wear shoes that: Do not have high heels. Have rubber bottoms. Are comfortable and fit you well. Are closed at the toe. Do not wear sandals. If you use a stepladder: Make sure that it is fully  opened. Do not climb a closed stepladder. Make sure that both sides of the stepladder are locked into place. Ask someone to hold it for you, if possible. Clearly mark and make sure that you can see: Any grab bars or handrails. First and last steps. Where the edge of each step is. Use tools that help you move around (mobility aids) if they are needed. These include: Canes. Walkers. Scooters. Crutches. Turn on the lights when you go into a dark area. Replace any light bulbs as soon as they burn out. Set up your furniture so you have a clear path. Avoid moving your furniture around. If any of your floors are uneven, fix them. If there are any pets around you, be aware of where they are. Review your medicines with your doctor. Some medicines can make you feel dizzy. This can increase your chance of falling. Ask your doctor what other things that you can do to help prevent falls. This information is not intended to replace advice given to you by your health care provider. Make sure you discuss any questions you have with your health care provider. Document Released: 03/10/2009 Document Revised: 10/20/2015 Document Reviewed: 06/18/2014 Elsevier Interactive Patient Education  2017 [employer].

## 2022-07-30 NOTE — Addendum Note (Signed)
Addended by: Ria Bush on: 07/30/2022 09:52 AM   Modules accepted: Orders

## 2022-07-30 NOTE — Assessment & Plan Note (Signed)
Stable period on probenecid '250mg'$  BID without recent gout flare - continue.

## 2022-07-30 NOTE — Assessment & Plan Note (Signed)
Continue vit D 1000 IU daily. 

## 2022-07-30 NOTE — Assessment & Plan Note (Addendum)
Describes length dependent axonal sensory neuropathy. Denies alcohol and DM hx.  Suggested trial b12 replacement x1 month, update with effect on neuropathy symptoms. Will evaluate with further labwork next labs.

## 2022-07-30 NOTE — Assessment & Plan Note (Signed)
Chronic, continue crestor and ursodiol with good LDL control and improving triglycerides. The ASCVD Risk score (Arnett DK, et al., 2019) failed to calculate for the following reasons:   The valid total cholesterol range is 130 to 320 mg/dL

## 2022-07-30 NOTE — Assessment & Plan Note (Signed)
Chronic, stable on current regimen - continue. 

## 2022-07-30 NOTE — Progress Notes (Signed)
Subjective:   Christian Kim is a 77 y.o. male who presents for Medicare Annual/Subsequent preventive examination.  Review of Systems    No ROS.  Medicare Wellness Virtual Visit.  Visual/audio telehealth visit, UTA vital signs.   See social history for additional risk factors.   Cardiac Risk Factors include: advanced age (>65mn, >>22women);male gender     Objective:    Today's Vitals   07/30/22 0818  Weight: 198 lb (89.8 kg)  Height: 5' 10.5" (1.791 m)   Body mass index is 28.01 kg/m.     07/30/2022    8:08 AM 02/19/2022    7:00 AM 07/08/2018   10:00 AM 06/28/2017   12:08 PM 06/19/2016   10:45 AM  Advanced Directives  Does Patient Have a Medical Advance Directive? Yes Yes Yes Yes No  Type of [place]   Does patient want to make changes to medical advance directive? No - Patient declined No - Patient declined     Copy of [place] Chart?  No - copy requested No - copy requested Yes     Current Medications (verified) Outpatient Encounter Medications as of 07/30/2022  Medication Sig   acetaminophen (TYLENOL) 500 MG tablet Take 1 tablet (500 mg total) by mouth every 6 (six) hours as needed.   amLODipine (NORVASC) 10 MG tablet TAKE 1 TABLET BY MOUTH ONCE A DAY   aspirin 81 MG tablet Take 81 mg by mouth daily.   Cholecalciferol (VITAMIN D3) 25 MCG (1000 UT) CAPS Take 1 capsule (1,000 Units total) by mouth daily.   clopidogrel (PLAVIX) 75 MG tablet Take 1 tablet (75 mg total) by mouth daily.   isosorbide mononitrate (IMDUR) 30 MG 24 hr tablet Take 0.5 tablets (15 mg total) by mouth daily. (Patient taking differently: Take 15 mg by mouth at bedtime.)   lisinopril (ZESTRIL) 40 MG tablet TAKE 1 TABLET BY MOUTH ONCE A DAY   metoprolol succinate (TOPROL-XL) 25 MG 24 hr tablet TAKE 1 TABLET BY MOUTH ONCE A DAY   omeprazole  (PRILOSEC) 20 MG capsule TAKE 1 CAPSULE BY MOUTH ONCE DAILY   pantoprazole (PROTONIX) 40 MG tablet Take 1 tablet (40 mg total) by mouth at bedtime.   probenecid (BENEMID) 500 MG tablet TAKE 1/2 A TABLET (250 MG TOTAL) BY MOUTH 2 TIMES DAILY.   rosuvastatin (CRESTOR) 20 MG tablet Take 1 tablet (20 mg total) by mouth daily.   triamcinolone cream (KENALOG) 0.1 % Apply 1 application  topically daily as needed (irritation).   ursodiol (ACTIGALL) 300 MG capsule Take 2 capsules (600 mg total) by mouth 2 (two) times daily.   MITIGARE 0.6 MG CAPS Take 1 capsule by mouth daily as needed (gout flare). Take 2 capsules on the first day of flare (Patient not taking: Reported on 07/30/2022)   nitroGLYCERIN (NITROSTAT) 0.4 MG SL tablet Place 1 tablet (0.4 mg total) under the tongue every 5 (five) minutes as needed for chest pain.   No facility-administered encounter medications on file as of 07/30/2022.    Allergies (verified) Allopurinol   History: Past Medical History:  Diagnosis Date   CAD (coronary artery disease) 2008   stent   CAP (community acquired pneumonia) 03/07/2015   Choledocholithiasis    recurrent/multiple ERCPs with stone extraction and sphincterotomy    CHOLEDOCHOLITHIASIS 09/20/2007   Qualifier: Diagnosis of  By: PRoxan Hockey Norchel  GERD (gastroesophageal reflux disease)    Gout 2013   dx by crystal analysis   Hyperlipidemia    Hypertension    Myocardial infarction [facility]) 12-2006   Prostatitis 01/22/2017   Past Surgical History:  Procedure Laterality Date   CHOLECYSTECTOMY  2006   w/ revision 2/2 persistent gallstones   COLONOSCOPY  06/2013   1 polyp, rpt 5 yrs [name])   COLONOSCOPY  11/2019   1 TA, int hem, no f/u needed [name]).    CORONARY ANGIOPLASTY WITH STENT PLACEMENT  2008   stent, Dr. Hyman Hopes   CORONARY STENT INTERVENTION N/A 02/19/2022   Procedure: CORONARY STENT INTERVENTION;  Surgeon: Sherren Mocha, MD;  Location: [place] [facility];  Service:  Cardiovascular;  Laterality: N/A;   INTRAVASCULAR PRESSURE WIRE/FFR STUDY N/A 02/19/2022   Procedure: INTRAVASCULAR PRESSURE WIRE/FFR STUDY;  Surgeon: Sherren Mocha, MD;  Location: [place] [facility];  Service: Cardiovascular;  Laterality: N/A;   INTRAVASCULAR ULTRASOUND/IVUS N/A 02/19/2022   Procedure: Intravascular Ultrasound/IVUS;  Surgeon: Sherren Mocha, MD;  Location: [place] [facility];  Service: Cardiovascular;  Laterality: N/A;   LEFT HEART CATH AND CORONARY ANGIOGRAPHY N/A 02/19/2022   Procedure: LEFT HEART CATH AND CORONARY ANGIOGRAPHY;  Surgeon: Sherren Mocha, MD;  Location: [place] [facility];  Service: Cardiovascular;  Laterality: N/A;   NASAL SEPTUM SURGERY     Family History  Problem Relation Age of Onset   Liver cancer Mother        unknown primary origin   Lung cancer Mother        smoker   Colon cancer Neg Hx    Esophageal cancer Neg Hx    Stomach cancer Neg Hx    Rectal cancer Neg Hx    Social History   Socioeconomic History   Marital status: Married    Spouse name: Not on file   Number of children: 2   Years of education: Not on file   Highest education level: Not on file  Occupational History   Occupation: retired Academic librarian   Occupation: Magazine features editor: Retired  Tobacco Use   Smoking status: Never   Smokeless tobacco: Never  Scientific laboratory technician Use: Never used  Substance and Sexual Activity   Alcohol use: Yes    Comment: rarely drinks - 1 to 2 beers per month   Drug use: No   Sexual activity: Yes  Other Topics Concern   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   Activity: works farm to stay active, rabbit hunting   Diet: good water, fruits/vegetables daily   Social Determinants of Health   Financial Resource Strain: Low Risk  (07/30/2022)   Overall Financial Resource Strain  (CARDIA)    Difficulty of Paying Living Expenses: Not hard at all  Food Insecurity: No Food Insecurity (07/30/2022)   Hunger Vital Sign    Worried About Running Out of Food in the Last Year: Never true    Ran Out of Food in the Last Year: Never true  Transportation Needs: No Transportation Needs (07/30/2022)   PRAPARE - Hydrologist (Medical): No    Lack of Transportation (Non-Medical): No  Physical Activity: Sufficiently Active (07/30/2022)   Exercise Vital Sign    Days of Exercise per Week: 5 days  Minutes of Exercise per Session: 30 min  Stress: No Stress Concern Present (07/30/2022)   [place]    Feeling of Stress : Not at all  Social Connections: Unknown (07/30/2022)   Social Connection and Isolation Panel [NHANES]    Frequency of Communication with Friends and Family: Not on file    Frequency of Social Gatherings with Friends and Family: Not on file    Attends Religious Services: Not on file    Active Member of Clubs or Organizations: Not on file    Attends Archivist Meetings: Not on file    Marital Status: Married    Tobacco Counseling Counseling given: Not Answered   Clinical Intake:  Pre-visit preparation completed: Yes        Diabetes: No  How often do you need to have someone help you when you read instructions, pamphlets, or other written materials from your doctor or pharmacy?: 1 - Never    Interpreter Needed?: No      Activities of Daily Living    07/30/2022    8:10 AM  In your present state of health, do you have any difficulty performing the following activities:  Hearing? 0  Vision? 0  Difficulty concentrating or making decisions? 0  Walking or climbing stairs? 0  Dressing or bathing? 0  Doing errands, shopping? 0  Preparing Food and eating ? N  Using the Toilet? N  In the past six months, have you accidently leaked urine? N  Do you have  problems with loss of bowel control? N  Managing your Medications? N  Managing your Finances? N  Housekeeping or managing your Housekeeping? N    Patient Care Team: Ria Bush, MD as PCP - General (Family Medicine) Sherren Mocha, MD as PCP - Cardiology (Cardiology)  Indicate any recent Medical Services you may have received from other than [facility] providers in the past year (date may be approximate).     Assessment:   This is a routine wellness examination for Christian Kim.  I connected with  Dierdre Searles on 07/30/22 by a audio enabled telemedicine application and verified that I am speaking with the correct person using two identifiers.  Patient Location: Home  Provider Location: Office/Clinic  I discussed the limitations of evaluation and management by telemedicine. The patient expressed understanding and agreed to proceed.   Hearing/Vision screen Hearing Screening - Comments:: Patient is able to hear conversational tones without difficulty.  No issues reported.   Vision Screening - Comments:: Followed by Dr. Matilde Sprang Wears corrective lenses when reading They have seen their ophthalmologist in the last 12 months.    Dietary issues and exercise activities discussed: Current Exercise Habits: Home exercise routine, Time (Minutes): 30, Frequency (Times/Week): 5, Weekly Exercise (Minutes/Week): 150, Intensity: Mild   Goals Addressed               This Visit's Progress     Patient Stated     Maintain healthy lifestyle (pt-stated)        Healthy diet Stay hydrated Stay active       Depression Screen    07/30/2022    8:10 AM 07/28/2021   10:41 AM 07/22/2020   11:45 AM 07/21/2019   11:39 AM 07/08/2018    9:52 AM 06/28/2017   12:11 PM 06/19/2016   10:33 AM  PHQ 2/9 Scores  PHQ - 2 Score 0 0 0 0 0 0 0  PHQ- 9 Score  0 0     Fall Risk    07/30/2022    8:10 AM 07/28/2021   10:41 AM 07/22/2020   11:44 AM 07/21/2019   11:39 AM 07/08/2018    9:52 AM  Fall Risk   Falls  in the past year? 0 0  1 1  Comment     tripped over gate in yard; no injury  Number falls in past yr: 0  0 0 0  Comment   Tipped over dog    Injury with Fall? 0  0 0 1  Follow up Falls evaluation completed;Falls prevention discussed        FALL RISK PREVENTION PERTAINING TO THE HOME: Home free of loose throw rugs in walkways, pet beds, electrical cords, etc? Yes  Adequate lighting in your home to reduce risk of falls? Yes   ASSISTIVE DEVICES UTILIZED TO PREVENT FALLS: Life alert? No  Use of a cane, walker or w/c? No  Grab bars in the bathroom? No  Shower chair or bench in shower? No  Elevated toilet seat or a handicapped toilet? No   TIMED UP AND GO: Was the test performed? No .   Cognitive Function:    07/08/2018    9:51 AM 06/28/2017   12:43 PM 06/19/2016   10:33 AM  MMSE - Mini Mental State Exam  Orientation to time '5 5 5  '$ Orientation to Place '5 5 5  '$ Registration '3 3 3  '$ Attention/ Calculation 0 0 0  Recall '3 3 3  '$ Language- name 2 objects 0 0 0  Language- repeat '1 1 1  '$ Language- follow 3 step command '3 3 3  '$ Language- read & follow direction 0 0 0  Write a sentence 0 0 0  Copy design 0 0 0  Total score '20 20 20        '$ 07/30/2022    8:13 AM  6CIT Screen  What Year? 0 points  What month? 0 points  What time? 0 points  Count back from 20 0 points  Months in reverse 0 points  Repeat phrase 0 points  Total Score 0 points    Immunizations Immunization History  Administered Date(s) Administered   Fluad Quad(high Dose 65+) 02/07/2022   Influenza Whole 03/09/2009, 02/26/2011, 02/26/2012   Influenza, High Dose Seasonal PF 02/03/2014, 04/15/2015, 03/24/2019, 03/15/2020, 03/14/2021   Influenza,inj,Quad PF,6+ Mos 03/04/2013, 03/08/2018   Influenza-Unspecified 02/26/2016, 02/25/2017   PFIZER(Purple Top)SARS-COV-2 Vaccination 07/14/2019, 08/04/2019, 05/18/2020   Pneumococcal Conjugate-13 06/07/2014   Pneumococcal Polysaccharide-23 01/09/2011   Td 01/29/2002   Tdap  06/14/2015   Zoster, Live 01/09/2011    Shingrix Completed?: No.    Education has been provided regarding the importance of this vaccine. Patient has been advised to call insurance company to determine out of pocket expense if they have not yet received this vaccine. Advised may also receive vaccine at local pharmacy or Health Dept. Verbalized acceptance and understanding.  Screening Tests Health Maintenance  Topic Date Due   COVID-19 Vaccine (4 - 2023-24 season) 08/15/2022 (Originally 01/26/2022)   Zoster Vaccines- Shingrix (1 of 2) 10/30/2022 (Originally 05/30/1995)   Medicare Annual Wellness ([place])  07/30/2023   COLONOSCOPY (Pts 45-73yr Insurance coverage will need to be confirmed)  12/21/2024   DTaP/Tdap/Td (3 - Td or Tdap) 06/13/2025   Pneumonia Vaccine 77 Years old  Completed   INFLUENZA VACCINE  Completed   Hepatitis C Screening  Completed   HPV VACCINES  Aged Out    Health Maintenance There are no  preventive care reminders to display for this patient.  Lung Cancer Screening: (Low Dose CT Chest recommended if Age 53-80 years, 30 pack-year currently smoking OR have quit w/in 15years.) does not qualify.   Hepatitis C Screening: Completed 05/2016.  Vision Screening: Recommended annual ophthalmology exams for early detection of glaucoma and other disorders of the eye.  Dental Screening: Recommended annual dental exams for proper oral hygiene.  Community Resource Referral / Chronic Care Management: CRR required this visit?  No   CCM required this visit?  No      Plan:     I have personally reviewed and noted the following in the patient's chart:   Medical and social history Use of alcohol, tobacco or illicit drugs  Current medications and supplements including opioid prescriptions. Patient is not currently taking opioid prescriptions. Functional ability and status Nutritional status Physical activity Advanced directives List of other physicians Hospitalizations,  surgeries, and ER visits in previous 12 months Vitals Screenings to include cognitive, depression, and falls Referrals and appointments  In addition, I have reviewed and discussed with patient certain preventive protocols, quality metrics, and best practice recommendations. A written personalized care plan for preventive services as well as general preventive health recommendations were provided to patient.     Leta Jungling, LPN   [id number]

## 2022-07-30 NOTE — Assessment & Plan Note (Signed)
Advanced directives:  Would want wife to be HCPOA. Has living will at home. [place] with life support if possibility of reversal but doesn't want prolonged life support if terminal condition. Asked to bring [place] a copy.

## 2022-07-30 NOTE — Patient Instructions (Addendum)
If ongoing symptoms past 24 hours, seek urgent medical care.  If interested, check with pharmacy about 2 shot shingles series (shingrix) or the new RSV shot.  Bring [place] a copy of your living will to update your chart.   Start oral iron ferrous sulfate '325mg'$  (65 FE) every other day. Work on iron-rich foods such as meats/chicken/fish, nuts/beans, iron fortified bread and cereal, and leafy greens.  Return as needed or in 6 months for follow up visit. Schedule 3 month lab visit only.

## 2022-07-30 NOTE — Assessment & Plan Note (Signed)
Stable period on pantoprazole '40mg'$  daily recently changed by cardiology - continue this.

## 2022-07-30 NOTE — Progress Notes (Addendum)
Patient ID: Christian Kim, male    DOB: January 01, 1946, 77 y.o.   MRN: [id number]  This visit was conducted in person.  BP 132/72   Pulse (!) 53   Temp (!) 97.4 F (36.3 C) (Temporal)   Ht '5\' 9"'$  (1.753 m)   Wt 188 lb 8 oz (85.5 kg)   SpO2 95%   BMI 27.84 kg/m    CC: CPE Subjective:   HPI: Christian Kim is a 77 y.o. male presenting on 07/30/2022 for Annual Exam (MCR prt 2 [AWV- 07/30/22]. Pt accompanied by wife, Katharine Look. )   Saw health advisor today for medicare wellness visit. Note reviewed.   No results found.  [place] [facility] Visit from 07/30/2022 in [place] at [place]  PHQ-2 Total Score 1          07/30/2022    8:50 AM 07/30/2022    8:10 AM 07/28/2021   10:41 AM 07/22/2020   11:44 AM 07/21/2019   11:39 AM  Fall Risk   Falls in the past year? 0 0 0  1  Number falls in past yr:  0  0 0  Comment    Tipped over dog   Injury with Fall?  0  0 0  Follow up  Falls evaluation completed;Falls prevention discussed       Chronic ursodiol use 2/2 recurrent choledocholithiasis after cholecystectomy followed by [facility], now seeing Q2 yrs.  CAD - sees cardiology yearly Burt Knack).  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   Underwent cardiac catheterization 01/2022 s/p stent placement x2.   2d ago started having abd discomfort associated with chills and sweats. Didn't check temperature. Urine also turns dark yellow. No nausea, vomiting, diarrhea/constipation, blood in stool. No indigestion or GERD symptoms.   Several months of bilateral toes falling asleep, worse at night time. No other numbness. Doesn't affect gait/stability. No burning pain, paresthesias.   Preventative: COLONOSCOPY Date: 06/2013 1 SSP, rpt 5 yrs [name]).  Colonoscopy 11/2019 - 1 TA, int hem, no f/u needed [name]).  Prostate cancer screening - no problems, aged out. Denies nocturia or LUTS sxs.  Lung cancer screening - not eligible Flu shot yearly.  [place]  06/2019, 07/2019, booster 04/2020 Pneumovax 2012, prevnar-13 05/2014 Tdap 05/2015  RSV - discussed  Zostavax 2012 - rash after he got shot.  Shingrix - discussed, to consider Advanced directives:  Would want wife to be HCPOA. Has living will at home. [place] with life support if possibility of reversal but doesn't want prolonged life support if terminal condition. Asked to bring [place] a copy.  Seat belt use discussed.  Sunscreen use discussed. No changing moles on skin.  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 Activity: works farm to stay active, deer and rabbit hunting  Diet: good water, fruits/vegetables daily      Relevant past medical, surgical, family and social history reviewed and updated as indicated. Interim medical history since our last visit reviewed. Allergies and medications reviewed and updated. Outpatient Medications Prior to Visit  Medication Sig Dispense Refill   acetaminophen (TYLENOL) 500 MG tablet Take 1 tablet (500 mg total) by mouth every 6 (  six) hours as needed.     aspirin 81 MG tablet Take 81 mg by mouth daily.     Cholecalciferol (VITAMIN D3) 25 MCG (1000 UT) CAPS Take 1 capsule (1,000 Units total) by mouth daily. 30 capsule    clopidogrel (PLAVIX) 75 MG tablet Take 1 tablet (75 mg total) by mouth daily. 90 tablet 3   isosorbide mononitrate (IMDUR) 30 MG 24 hr tablet Take 0.5 tablets (15 mg total) by mouth daily. (Patient taking differently: Take 15 mg by mouth at bedtime.) 45 tablet 3   MITIGARE 0.6 MG CAPS Take 1 capsule by mouth daily as needed (gout flare). Take 2 capsules on the first day of flare 30 capsule 3   pantoprazole (PROTONIX) 40 MG tablet Take 1 tablet (40 mg total) by mouth at bedtime. 90 tablet 3    rosuvastatin (CRESTOR) 20 MG tablet Take 1 tablet (20 mg total) by mouth daily. 90 tablet 3   triamcinolone cream (KENALOG) 0.1 % Apply 1 application  topically daily as needed (irritation).     ursodiol (ACTIGALL) 300 MG capsule Take 2 capsules (600 mg total) by mouth 2 (two) times daily. 360 capsule 2   amLODipine (NORVASC) 10 MG tablet TAKE 1 TABLET BY MOUTH ONCE A DAY 90 tablet 2   lisinopril (ZESTRIL) 40 MG tablet TAKE 1 TABLET BY MOUTH ONCE A DAY 90 tablet 2   metoprolol succinate (TOPROL-XL) 25 MG 24 hr tablet TAKE 1 TABLET BY MOUTH ONCE A DAY 90 tablet 2   omeprazole (PRILOSEC) 20 MG capsule TAKE 1 CAPSULE BY MOUTH ONCE DAILY 30 capsule 0   probenecid (BENEMID) 500 MG tablet TAKE 1/2 A TABLET (250 MG TOTAL) BY MOUTH 2 TIMES DAILY. 30 tablet 11   nitroGLYCERIN (NITROSTAT) 0.4 MG SL tablet Place 1 tablet (0.4 mg total) under the tongue every 5 (five) minutes as needed for chest pain. 90 tablet 3   No facility-administered medications prior to visit.     Per HPI unless specifically indicated in ROS section below Review of Systems  Constitutional:  Positive for chills and fever. Negative for activity change, appetite change, fatigue and unexpected weight change.  HENT:  Negative for hearing loss.   Eyes:  Negative for visual disturbance.  Respiratory:  Negative for cough, chest tightness, shortness of breath and wheezing.   Cardiovascular:  Negative for chest pain, palpitations and leg swelling.  Gastrointestinal:  Negative for abdominal distention, abdominal pain, blood in stool, constipation, diarrhea, nausea and vomiting.  Genitourinary:  Negative for difficulty urinating and hematuria.  Musculoskeletal:  Negative for arthralgias, myalgias and neck pain.  Skin:  Negative for rash.  Neurological:  Negative for dizziness, seizures, syncope and headaches.  Hematological:  Negative for adenopathy. Does not bruise/bleed easily.  Psychiatric/Behavioral:  Negative for dysphoric mood. The  patient is not nervous/anxious.     Objective:  BP 132/72   Pulse (!) 53   Temp (!) 97.4 F (36.3 C) (Temporal)   Ht '5\' 9"'$  (1.753 m)   Wt 188 lb 8 oz (85.5 kg)   SpO2 95%   BMI 27.84 kg/m   Wt Readings from Last 3 Encounters:  07/30/22 188 lb 8 oz (85.5 kg)  07/30/22 198 lb (89.8 kg)  06/14/22 198 lb 8 oz (90 kg)      Physical Exam Vitals and nursing note reviewed.  Constitutional:      General: He is not in acute distress.    Appearance: Normal appearance. He is well-developed. He is not ill-appearing.  HENT:     Head: Normocephalic and atraumatic.     Right Ear: Hearing, tympanic membrane, ear canal and external ear normal.     Left Ear: Hearing, tympanic membrane, ear canal and external ear normal.     Mouth/Throat:     Mouth: Mucous membranes are moist.     Pharynx: Oropharynx is clear. No oropharyngeal exudate or posterior oropharyngeal erythema.  Eyes:     General: No scleral icterus.    Extraocular Movements: Extraocular movements intact.     Conjunctiva/sclera: Conjunctivae normal.     Pupils: Pupils are equal, round, and reactive to light.  Neck:     Thyroid: No thyroid mass or thyromegaly.     Vascular: No carotid bruit.  Cardiovascular:     Rate and Rhythm: Normal rate and regular rhythm.     Pulses: Normal pulses.          Radial pulses are 2+ on the right side and 2+ on the left side.     Heart sounds: Normal heart sounds. No murmur heard. Pulmonary:     Effort: Pulmonary effort is normal. No respiratory distress.     Breath sounds: Normal breath sounds. No wheezing, rhonchi or rales.  Abdominal:     General: Bowel sounds are normal. There is no distension.     Palpations: Abdomen is soft. There is no mass.     Tenderness: There is no abdominal tenderness. There is no guarding or rebound.     Hernia: No hernia is present.  Musculoskeletal:        General: Normal range of motion.     Cervical back: Normal range of motion and neck supple.     Right  lower leg: No edema.     Left lower leg: No edema.  Lymphadenopathy:     Cervical: No cervical adenopathy.  Skin:    General: Skin is warm and dry.     Findings: No rash.  Neurological:     General: No focal deficit present.     Mental Status: He is alert and oriented to person, place, and time.  Psychiatric:        Mood and Affect: Mood normal.        Behavior: Behavior normal.        Thought Content: Thought content normal.        Judgment: Judgment normal.       Results for orders placed or performed in visit on 07/23/22  IBC panel  Result Value Ref Range   Iron 44 42 - 165 ug/dL   Transferrin 373.0 (H) 212.0 - 360.0 mg/dL   Saturation Ratios 8.4 (L) 20.0 - 50.0 %   TIBC 522.2 (H) 250.0 - 450.0 mcg/dL  Ferritin  Result Value Ref Range   Ferritin 12.1 (L) 22.0 - 322.0 ng/mL  Uric acid  Result Value Ref Range   Uric Acid, Serum 5.3 4.0 - 7.8 mg/dL  CBC with Differential/Platelet  Result Value Ref Range   WBC 7.0 4.0 - 10.5 K/uL   RBC 4.71 4.22 - 5.81 Mil/uL   Hemoglobin 12.6 (L) 13.0 - 17.0 g/dL   HCT 38.6 (L) 39.0 - 52.0 %   MCV 81.8 78.0 - 100.0 fl   MCHC 32.7 30.0 - 36.0 g/dL   RDW 16.2 (H) 11.5 - 15.5 %   Platelets 213.0 150.0 - 400.0 K/uL   Neutrophils Relative % 70.5 43.0 - 77.0 %   Lymphocytes Relative 15.1 12.0 - 46.0 %   Monocytes  Relative 9.1 3.0 - 12.0 %   Eosinophils Relative 4.8 0.0 - 5.0 %   Basophils Relative 0.5 0.0 - 3.0 %   Neutro Abs 4.9 1.4 - 7.7 K/uL   Lymphs Abs 1.1 0.7 - 4.0 K/uL   Monocytes Absolute 0.6 0.1 - 1.0 K/uL   Eosinophils Absolute 0.3 0.0 - 0.7 K/uL   Basophils Absolute 0.0 0.0 - 0.1 K/uL  Parathyroid hormone, intact (no Ca)  Result Value Ref Range   PTH 29 16 - 77 pg/mL  Microalbumin / creatinine urine ratio  Result Value Ref Range   Microalb, Ur 0.9 0.0 - 1.9 mg/dL   Creatinine,U 192.8 mg/dL   Microalb Creat Ratio 0.5 0.0 - 30.0 mg/g  VITAMIN D 25 Hydroxy (Vit-D Deficiency, Fractures)  Result Value Ref Range   VITD  37.82 30.00 - 100.00 ng/mL  Phosphorus  Result Value Ref Range   Phosphorus 3.6 2.3 - 4.6 mg/dL  Comprehensive metabolic panel  Result Value Ref Range   Sodium 141 135 - 145 mEq/L   Potassium 4.4 3.5 - 5.1 mEq/L   Chloride 104 96 - 112 mEq/L   CO2 29 19 - 32 mEq/L   Glucose, Bld 91 70 - 99 mg/dL   BUN 19 6 - 23 mg/dL   Creatinine, Ser 1.46 0.40 - 1.50 mg/dL   Total Bilirubin 0.6 0.2 - 1.2 mg/dL   Alkaline Phosphatase 120 (H) 39 - 117 U/L   AST 17 0 - 37 U/L   ALT 25 0 - 53 U/L   Total Protein 7.0 6.0 - 8.3 g/dL   Albumin 4.0 3.5 - 5.2 g/dL   GFR 46.22 (L) >60.00 mL/min   Calcium 9.6 8.4 - 10.5 mg/dL  Lipid panel  Result Value Ref Range   Cholesterol 111 0 - 200 mg/dL   Triglycerides 167.0 (H) 0.0 - 149.0 mg/dL   HDL 32.00 (L) >39.00 mg/dL   VLDL 33.4 0.0 - 40.0 mg/dL   LDL Cholesterol 46 0 - 99 mg/dL   Total CHOL/HDL Ratio 3    NonHDL 79.31     Assessment & Plan:   Problem List Items Addressed This Visit     HLD (hyperlipidemia) (Chronic)    Chronic, continue crestor and ursodiol with good LDL control and improving triglycerides. The ASCVD Risk score (Arnett DK, et al., 2019) failed to calculate for the following reasons:   The valid total cholesterol range is 130 to 320 mg/dL       Relevant Medications   amLODipine (NORVASC) 10 MG tablet   lisinopril (ZESTRIL) 40 MG tablet   metoprolol succinate (TOPROL-XL) 25 MG 24 hr tablet   Essential hypertension (Chronic)    Chronic, stable on current regimen - continue.       Relevant Medications   amLODipine (NORVASC) 10 MG tablet   lisinopril (ZESTRIL) 40 MG tablet   metoprolol succinate (TOPROL-XL) 25 MG 24 hr tablet   Coronary artery disease involving native coronary artery of native heart without angina pectoris (Chronic)    Appreciate cardiology care.       Relevant Medications   amLODipine (NORVASC) 10 MG tablet   lisinopril (ZESTRIL) 40 MG tablet   metoprolol succinate (TOPROL-XL) 25 MG 24 hr tablet    Chronic kidney disease, stage 3b (HCC) (Chronic)    Chronic, stable. Latest GFR 46. Reviewed avoiding NSAIDs, ensure good hydration status       Relevant Orders   Renal function panel   Urinalysis, Routine w reflex microscopic   Health  maintenance examination - Primary (Chronic)    Preventative protocols reviewed and updated unless pt declined. Discussed healthy diet and lifestyle.       Advanced directives, counseling/discussion (Chronic)    Advanced directives:  Would want wife to be HCPOA. Has living will at home. [place] with life support if possibility of reversal but doesn't want prolonged life support if terminal condition. Asked to bring [place] a copy.       Gout    Stable period on probenecid '250mg'$  BID without recent gout flare - continue.       Vitamin D deficiency    Continue vit D 1000 IU daily.       Gastroesophageal reflux disease without esophagitis    Stable period on pantoprazole '40mg'$  daily recently changed by cardiology - continue this.       Iron deficiency anemia    iFOB negative 01/2022 Rec start oral iron ferrous sulfate '325mg'$  (65FE) QOD.  Reassess levels in 3 month lab visit.       Relevant Medications   Iron, Ferrous Sulfate, 325 (65 Fe) MG TABS   cyanocobalamin (VITAMIN B12) 500 MCG tablet   Other Relevant Orders   Ferritin   IBC panel   CBC with Differential/Platelet   Peripheral neuropathy    Describes length dependent axonal sensory neuropathy. Denies alcohol and DM hx.  Suggested trial b12 replacement x1 month, update with effect on neuropathy symptoms. Will evaluate with further labwork next labs.       Relevant Orders   TSH   Serum protein electrophoresis with reflex   Vitamin B12     Meds ordered this encounter  Medications   Iron, Ferrous Sulfate, 325 (65 Fe) MG TABS    Sig: Take 325 mg by mouth every Monday, Wednesday, and Friday.   cyanocobalamin (VITAMIN B12) 500 MCG tablet    Sig: Take 1 tablet (500 mcg total) by mouth daily.    amLODipine (NORVASC) 10 MG tablet    Sig: Take 1 tablet (10 mg total) by mouth daily.    Dispense:  90 tablet    Refill:  4   lisinopril (ZESTRIL) 40 MG tablet    Sig: Take 1 tablet (40 mg total) by mouth daily.    Dispense:  90 tablet    Refill:  4   metoprolol succinate (TOPROL-XL) 25 MG 24 hr tablet    Sig: Take 1 tablet (25 mg total) by mouth daily.    Dispense:  90 tablet    Refill:  4   probenecid (BENEMID) 500 MG tablet    Sig: TAKE 1/2 A TABLET (250 MG TOTAL) BY MOUTH 2 TIMES DAILY.    Dispense:  90 tablet    Refill:  4    Orders Placed This Encounter  Procedures   Ferritin    Standing Status:   Future    Standing Expiration Date:   07/30/2023   IBC panel    Standing Status:   Future    Standing Expiration Date:   07/30/2023   CBC with Differential/Platelet    Standing Status:   Future    Standing Expiration Date:   07/30/2023   TSH    Standing Status:   Future    Standing Expiration Date:   07/30/2023   Renal function panel    Standing Status:   Future    Standing Expiration Date:   07/30/2023   Serum protein electrophoresis with reflex    Standing Status:   Future    Standing  Expiration Date:   07/30/2023   Vitamin B12    Standing Status:   Future    Standing Expiration Date:   07/30/2023   Urinalysis, Routine w reflex microscopic    Standing Status:   Future    Standing Expiration Date:   07/30/2023    Patient Instructions  If ongoing symptoms past 24 hours, seek urgent medical care.  If interested, check with pharmacy about 2 shot shingles series (shingrix) or the new RSV shot.  Bring [place] a copy of your living will to update your chart.   Start oral iron ferrous sulfate '325mg'$  (65 FE) every other day. Work on iron-rich foods such as meats/chicken/fish, nuts/beans, iron fortified bread and cereal, and leafy greens.  Return as needed or in 6 months for follow up visit. Schedule 3 month lab visit only.   Follow up plan: Return in about 6 months (around 01/30/2023), or if  symptoms worsen or fail to improve, for follow up visit.  Ria Bush, MD

## 2022-07-30 NOTE — Assessment & Plan Note (Signed)
Appreciate cardiology care.  °

## 2022-07-30 NOTE — Assessment & Plan Note (Signed)
Preventative protocols reviewed and updated unless pt declined. Discussed healthy diet and lifestyle.  

## 2022-07-30 NOTE — Assessment & Plan Note (Addendum)
iFOB negative 01/2022 Rec start oral iron ferrous sulfate '325mg'$  (65FE) QOD.  Reassess levels in 3 month lab visit.

## 2022-07-30 NOTE — Assessment & Plan Note (Signed)
Chronic, stable. Latest GFR 46. Reviewed avoiding NSAIDs, ensure good hydration status

## 2022-11-21 ENCOUNTER — Other Ambulatory Visit: Payer: Self-pay | Admitting: Family Medicine

## 2022-11-21 NOTE — Telephone Encounter (Signed)
Too soon. Rx sent on 10/24/22, #90/4 to [facility].  Request denied.

## 2022-12-01 ENCOUNTER — Other Ambulatory Visit: Payer: Self-pay | Admitting: Family Medicine

## 2022-12-03 NOTE — Telephone Encounter (Signed)
Too soon. Rx sent on 10/24/22, #90/4 to [facility].  Request denied.  

## 2022-12-03 NOTE — Telephone Encounter (Signed)
[facility] states last filled on 5.29.24 for a 30 day supply

## 2022-12-19 DIAGNOSIS — D225 Melanocytic nevi of trunk: Secondary | ICD-10-CM | POA: Diagnosis not present

## 2022-12-19 DIAGNOSIS — X32XXXA Exposure to sunlight, initial encounter: Secondary | ICD-10-CM | POA: Diagnosis not present

## 2022-12-19 DIAGNOSIS — L570 Actinic keratosis: Secondary | ICD-10-CM | POA: Diagnosis not present

## 2022-12-19 DIAGNOSIS — Z1283 Encounter for screening for malignant neoplasm of skin: Secondary | ICD-10-CM | POA: Diagnosis not present

## 2023-02-07 ENCOUNTER — Other Ambulatory Visit: Payer: Self-pay | Admitting: Cardiovascular Disease

## 2023-02-07 ENCOUNTER — Other Ambulatory Visit: Payer: Self-pay | Admitting: Physician Assistant

## 2023-03-06 ENCOUNTER — Other Ambulatory Visit: Payer: Self-pay | Admitting: Cardiovascular Disease

## 2023-03-16 ENCOUNTER — Other Ambulatory Visit: Payer: Self-pay | Admitting: Cardiovascular Disease

## 2023-04-02 ENCOUNTER — Other Ambulatory Visit: Payer: Self-pay | Admitting: Cardiovascular Disease

## 2023-04-11 ENCOUNTER — Telehealth: Payer: Self-pay | Admitting: Cardiovascular Disease

## 2023-04-11 MED ORDER — PANTOPRAZOLE SODIUM 40 MG PO TBEC: 40.0000 mg | DELAYED_RELEASE_TABLET | Freq: Every day | ORAL | 0 refills | Status: DC

## 2023-04-11 MED ORDER — PANTOPRAZOLE SODIUM 40 MG PO TBEC: 40.0000 mg | DELAYED_RELEASE_TABLET | Freq: Every day | ORAL | 2 refills | Status: DC

## 2023-04-11 NOTE — Telephone Encounter (Signed)
RX sent to requested Pharmacy

## 2023-04-11 NOTE — Addendum Note (Signed)
Addended by: Adriana Simas, Serenah Mill L on: 04/11/2023 12:11 PM   Modules accepted: Orders

## 2023-04-11 NOTE — Telephone Encounter (Signed)
*  STAT* If patient is at the pharmacy, call can be transferred to refill team.   1. Which medications need to be refilled? (please list name of each medication and dose if known)   pantoprazole (PROTONIX) 40 MG tablet     2. Would you like to learn more about the convenience, safety, & potential cost savings by using the [facility]? No   3. Are you open to using the [facility] (Type [facility].  ) No   4. Which pharmacy/location (including street and city if local pharmacy) is medication to be sent to?  [facility] - [place], [place] - [address]   5. Do they need a 30 day or 90 day supply? 90 day   Pt is out of medication and has scheduled appt on 12/24

## 2023-04-13 ENCOUNTER — Other Ambulatory Visit: Payer: Self-pay | Admitting: Cardiovascular Disease

## 2023-04-18 ENCOUNTER — Other Ambulatory Visit: Payer: Self-pay | Admitting: Cardiovascular Disease

## 2023-05-13 ENCOUNTER — Other Ambulatory Visit: Payer: Self-pay | Admitting: Cardiovascular Disease

## 2023-05-15 ENCOUNTER — Other Ambulatory Visit: Payer: Self-pay | Admitting: Physician Assistant

## 2023-05-20 NOTE — Assessment & Plan Note (Signed)
LDL optimal in Feb 2024. Triglycerides were mildly elevated. *** -Continue ***

## 2023-05-20 NOTE — Progress Notes (Unsigned)
Cardiology Office Note:    Date:  05/21/2023  ID:  Steward Drone, DOB 1946-03-03, MRN [id number] PCP: Eustaquio Boyden, MD  [facility] Providers Cardiologist:  Tonny Bollman, MD       Patient Profile:      Coronary artery disease  NSTEMI in 12/2006 s/p complex PCI of LCx w overlapping DES Staged PCI 3 x 15 mm Promus DES to RCA LAD 60 >> managed med Stress Echocardiogram 2018: normal S/p 3 x 16 mm and 3 x 12 mm Synergy DES to ost and mid RCA 01/2022 Residual CAD: pLAD 65, LCx stents patent  Hypertension  Hyperlipidemia  Chronic kidney disease  GERD s/p cholecystectomy  Choledocholithiasis s/p multiple ERCPs w stone extraction, sphincterotomy          History of Present Illness:  Discussed the use of AI scribe software for clinical note transcription with the patient, who gave verbal consent to proceed.  Christian Kim is a 77 y.o. male who returns for follow up of CAD. He was last seen in 02/2022.  He is here alone.  He has been doing well without chest discomfort, arm discomfort, shortness of breath, syncope, near syncope, orthopnea, leg edema.  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       Review of Systems  Cardiovascular:  Negative for claudication.  Hematologic/Lymphatic: Does not bruise/bleed easily.  See HPI    Studies Reviewed:   EKG Interpretation Date/Time:  Tuesday May 21 2023 10:05:46 EST Ventricular Rate:  50 PR Interval:  164 QRS Duration:  68 QT Interval:  442 QTC Calculation: 402 R Axis:   -12  Text Interpretation: Sinus bradycardia Inferior infarct , age undetermined No significant change since last tracing Confirmed by Tereso Newcomer [id number]) on 05/21/2023 10:09:09 AM    Labs - Chart Review 07/23/22: K 4.4, SCr 1.46, eGFR 46.22, ALT 25, TC 111, HDL 32, LDL 46, Tg 167, Hgb 12.6       Risk Assessment/Calculations:            Physical Exam:   VS:  BP (!) 120/50   Pulse (!) 50   Ht 5'  10" (1.778 m)   Wt 189 lb (85.7 kg)   SpO2 97%   BMI 27.12 kg/m    Wt Readings from Last 3 Encounters:  05/21/23 189 lb (85.7 kg)  07/30/22 188 lb 8 oz (85.5 kg)  07/30/22 198 lb (89.8 kg)    Constitutional:      Appearance: Healthy appearance. Not in distress.  Neck:     Vascular: No carotid bruit. JVD normal.  Pulmonary:     Breath sounds: Normal breath sounds. No wheezing. No rales.  Cardiovascular:     Normal rate. Regular rhythm.     Murmurs: There is no murmur.  Edema:    Peripheral edema absent.  Abdominal:     Palpations: Abdomen is soft.        Assessment and Plan:   Assessment & Plan Coronary artery disease involving native coronary artery of native heart without angina pectoris History of non-STEMI in 2018 with overlapping DES to the LCx and staged DES to the RCA.  He developed R shoulder symptoms in 01/2022 that were reminiscent of his previous angina.  Cardiac catheterization demonstrated high-grade disease in the ostial and mid RCA treated with DES x 2. He had residual moderate nonobstructive disease in the LAD, patent LCx stents, patent mid RCA stent.  He is doing well without symptoms suggestive of angina.  Given multiple stents, I have recommended continuing DAPT for now. -Continue aspirin 81 mg daily, Plavix 25 mg daily, Imdur 15 mg daily, amlodipine 10 mg daily, Toprol-XL 25 mg daily, Crestor 20 mg daily, nitroglycerin as needed -Follow-up 1 year Essential hypertension BP controlled -Continue amlodipine 10 mg daily, Imdur 15 mg daily, lisinopril 40 mg daily, Toprol XL 25 mg daily Mixed hyperlipidemia LDL optimal in Feb 2024. Triglycerides were mildly elevated.  -Continue Crestor 20 mg daily -Work on diet to reduce triglycerides Chronic kidney disease, stage 3b (HCC) Recent creatinine stable.        Dispo:  Return in 1 year (on 05/20/2024) for Routine Follow Up, w/ Dr. Excell Seltzer, or Tereso Newcomer, PA-C.  Signed, Tereso Newcomer, PA-C

## 2023-05-20 NOTE — Assessment & Plan Note (Signed)
History of non-STEMI in 2018 with overlapping DES to the LCx and staged DES to the RCA.  He developed R shoulder symptoms in 01/2022 that were reminiscent of his previous angina.  Cardiac catheterization demonstrated high-grade disease in the ostial and mid RCA treated with DES x 2. He had residual moderate nonobstructive disease in the LAD, patent LCx stents, patent mid RCA stent. *** -Continue ***

## 2023-05-20 NOTE — Assessment & Plan Note (Signed)
Recent creatinine stable. 

## 2023-05-20 NOTE — Assessment & Plan Note (Signed)
BP controlled.  Continue hydralazine 50 mg 3 times a day, isosorbide dinitrate 30 mg twice daily, Entresto 24/26 mg twice daily, spironolactone 25 mg daily

## 2023-05-21 ENCOUNTER — Ambulatory Visit: Payer: Medicare PPO | Attending: Physician Assistant | Admitting: Physician Assistant

## 2023-05-21 ENCOUNTER — Encounter: Payer: Self-pay | Admitting: Physician Assistant

## 2023-05-21 VITALS — BP 120/50 | HR 50 | Ht 70.0 in | Wt 189.0 lb

## 2023-05-21 DIAGNOSIS — N1832 Chronic kidney disease, stage 3b: Secondary | ICD-10-CM | POA: Diagnosis not present

## 2023-05-21 DIAGNOSIS — E782 Mixed hyperlipidemia: Secondary | ICD-10-CM

## 2023-05-21 DIAGNOSIS — I2510 Atherosclerotic heart disease of native coronary artery without angina pectoris: Secondary | ICD-10-CM | POA: Diagnosis not present

## 2023-05-21 DIAGNOSIS — I10 Essential (primary) hypertension: Secondary | ICD-10-CM

## 2023-05-21 MED ORDER — NITROGLYCERIN 0.4 MG SL SUBL: 0.4000 mg | SUBLINGUAL_TABLET | SUBLINGUAL | 3 refills | Status: AC | PRN

## 2023-05-21 MED ORDER — CLOPIDOGREL BISULFATE 75 MG PO TABS: 75.0000 mg | ORAL_TABLET | Freq: Every day | ORAL | 3 refills | Status: DC

## 2023-05-21 MED ORDER — ROSUVASTATIN CALCIUM 20 MG PO TABS: 20.0000 mg | ORAL_TABLET | Freq: Every day | ORAL | 3 refills | Status: AC

## 2023-05-21 MED ORDER — AMLODIPINE BESYLATE 10 MG PO TABS: 10.0000 mg | ORAL_TABLET | Freq: Every day | ORAL | 3 refills | Status: DC

## 2023-05-21 MED ORDER — ISOSORBIDE MONONITRATE ER 30 MG PO TB24: 15.0000 mg | ORAL_TABLET | Freq: Every day | ORAL | 3 refills | Status: DC

## 2023-05-21 MED ORDER — METOPROLOL SUCCINATE ER 25 MG PO TB24: 25.0000 mg | ORAL_TABLET | Freq: Every day | ORAL | 3 refills | Status: AC

## 2023-05-21 NOTE — Patient Instructions (Signed)
Medication Instructions:   Your physician recommends that you continue on your current medications as directed. Please refer to the Current Medication list given to you today.   *If you need a refill on your cardiac medications before your next appointment, please call your pharmacy*   Lab Work:  None ordered.  If you have labs (blood work) drawn today and your tests are completely normal, you will receive your results only by: MyChart Message (if you have MyChart) OR A paper copy in the mail If you have any lab test that is abnormal or we need to change your treatment, we will call you to review the results.   Testing/Procedures:  None ordered.   Follow-Up: At [facility], you and your health needs are our priority.  As part of our continuing mission to provide you with exceptional heart care, we have created designated Provider Care Teams.  These Care Teams include your primary Cardiologist (physician) and Advanced Practice Providers (APPs -  Physician Assistants and Nurse Practitioners) who all work together to provide you with the care you need, when you need it.  We recommend signing up for the patient portal called "MyChart".  Sign up information is provided on this After Visit Summary.  MyChart is used to connect with patients for Virtual Visits (Telemedicine).  Patients are able to view lab/test results, encounter notes, upcoming appointments, etc.  Non-urgent messages can be sent to your provider as well.   To learn more about what you can do with MyChart, go to [email or web address].    Your next appointment:   1 year(s)  Provider:   Tonny Bollman, MD  or Tereso Newcomer, PA-C         Other Instructions  Your physician wants you to follow-up in: 1 year. You will receive a reminder letter in the mail two months in advance. If you don't receive a letter, please call our office to schedule the follow-up appointment.

## 2023-08-06 ENCOUNTER — Other Ambulatory Visit: Payer: Self-pay | Admitting: Family Medicine

## 2023-08-06 DIAGNOSIS — M1A9XX0 Chronic gout, unspecified, without tophus (tophi): Secondary | ICD-10-CM

## 2023-08-06 NOTE — Telephone Encounter (Signed)
 E-scribed refill.  Pt has AWV on 09/06/23. Plz schedule CPE and fasting labs (no food/drink- except water and/or blk coffee 5 hrs prior) after 09/06/23 to prevent delays in future refills.

## 2023-08-07 ENCOUNTER — Telehealth: Payer: Self-pay | Admitting: Family Medicine

## 2023-08-07 MED ORDER — AMLODIPINE BESYLATE 10 MG PO TABS
10.0000 mg | ORAL_TABLET | Freq: Every day | ORAL | 3 refills | Status: AC
Start: 2023-08-07 — End: ?

## 2023-08-07 NOTE — Addendum Note (Signed)
 Addended byAlben Spittle, Lorin Picket T on: 08/07/2023 11:01 AM   Modules accepted: Orders

## 2023-08-07 NOTE — Telephone Encounter (Signed)
 Patient has been scheduled

## 2023-08-07 NOTE — Telephone Encounter (Signed)
 Prescription Request  08/07/2023  LOV: Visit date not found  What is the name of the medication or equipment? amLODipine (NORVASC) 10 MG tablet   Have you contacted your pharmacy to request a refill? No   Which pharmacy would you like this sent to?  [facility] - [place], [place] - [address] [place] [address] Phone: [phone] Fax: [phone]    Patient notified that their request is being sent to the clinical staff for review and that they should receive a response within 2 business days.   Please advise at [facility] [phone]

## 2023-08-07 NOTE — Telephone Encounter (Signed)
 Noted.

## 2023-08-12 ENCOUNTER — Other Ambulatory Visit: Payer: Self-pay | Admitting: Physician Assistant

## 2023-09-02 IMAGING — DX DG LUMBAR SPINE COMPLETE 4+V
5 series · 5 of 5 positions shown · non-contrast
Comparison: None.

CLINICAL DATA: Chronic left-sided low back pain with left-sided
sciatica. Midline low back pain with left-sided sciatica.

EXAM:
LUMBAR SPINE - COMPLETE 4+ VIEW

[lumbar spine ap]
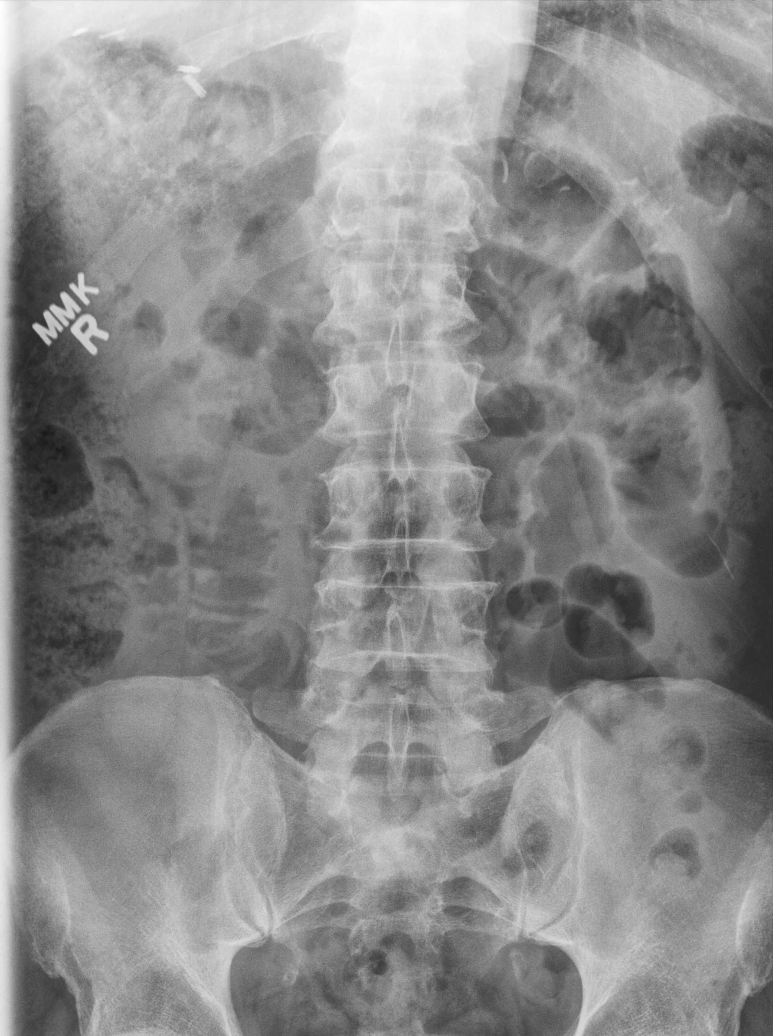

[lumbar spine mlo (1 of 2)]
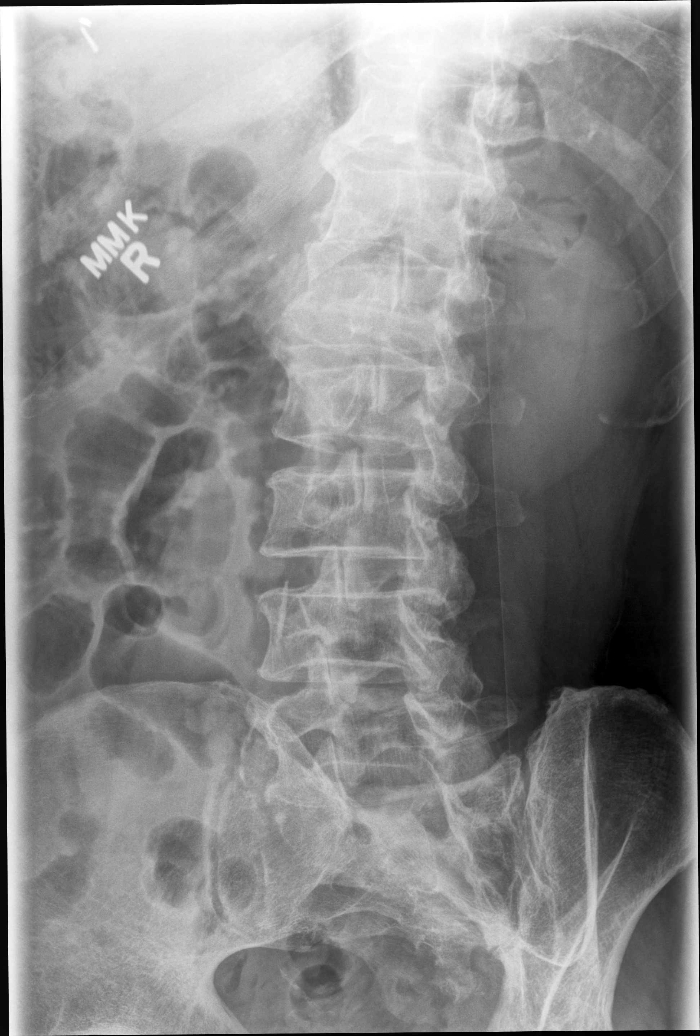

[lumbar spine mlo (2 of 2)]
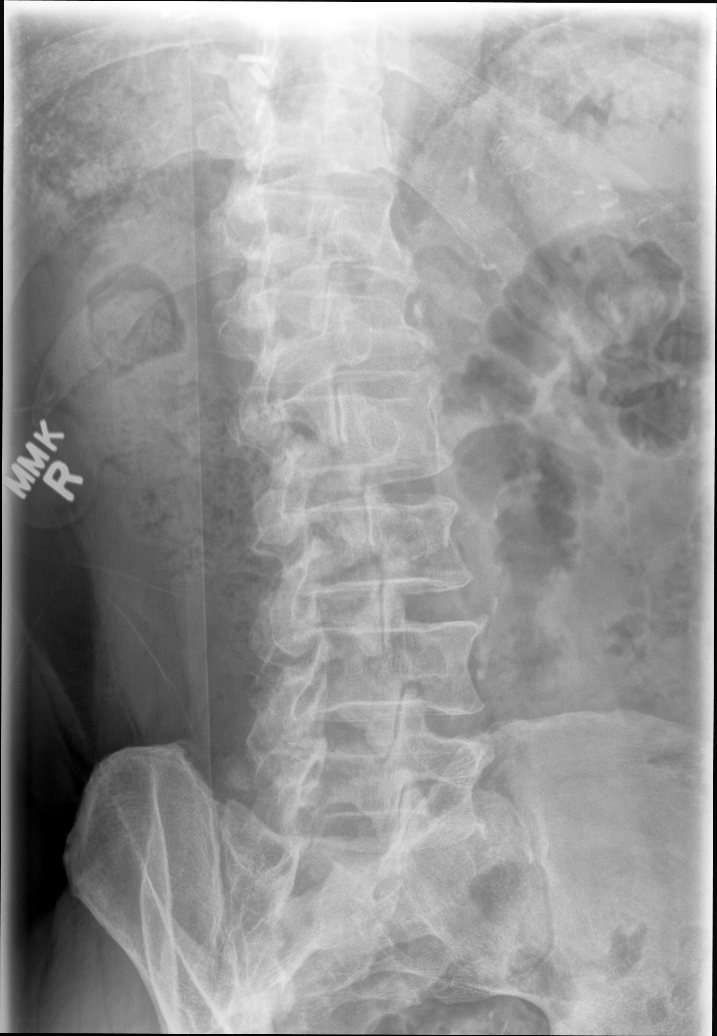

[lumbar spine lat (1 of 2)]
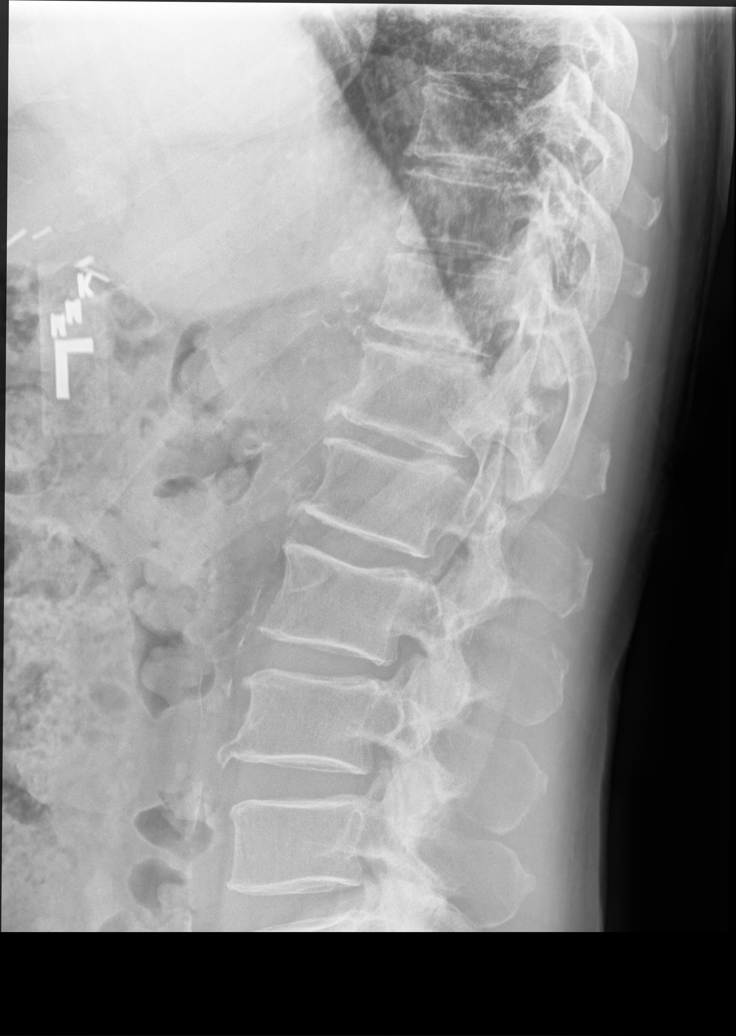

[lumbar spine lat (2 of 2)]
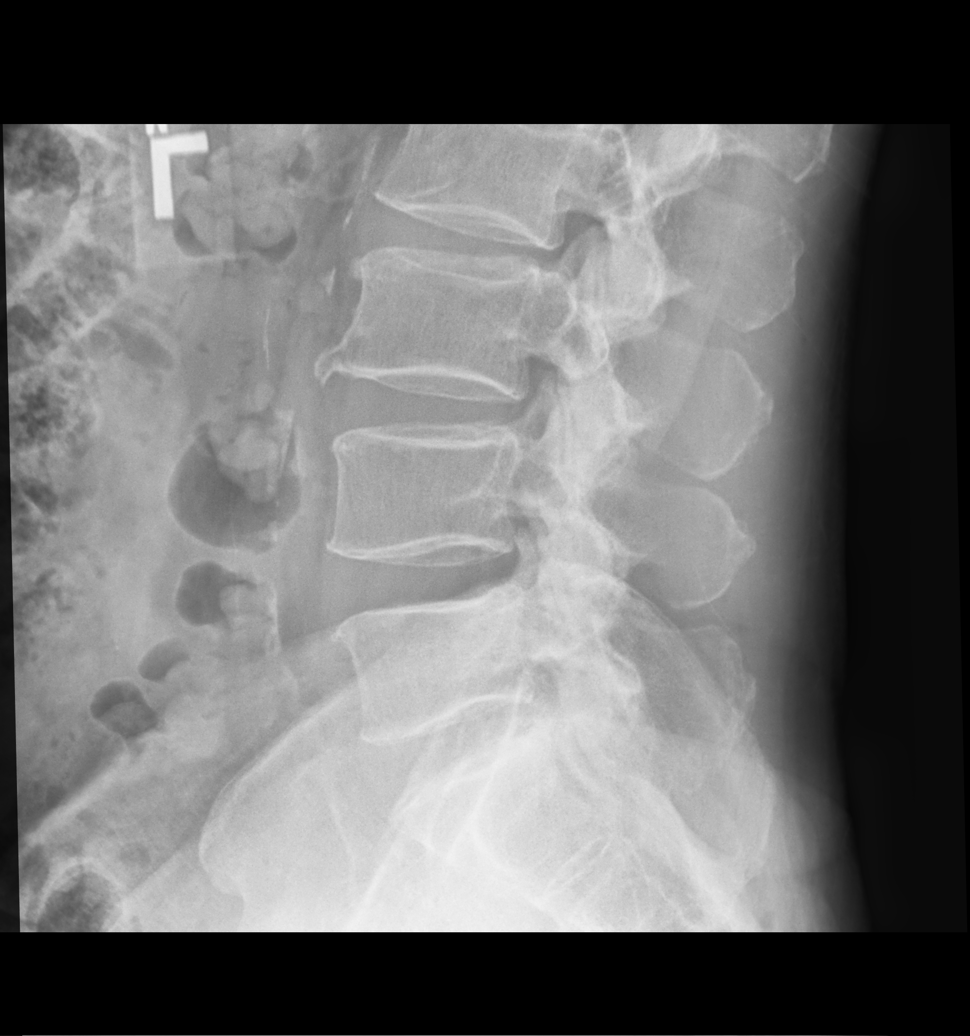

[5 of 5 positions shown; findings below may reference images not displayed]

FINDINGS: There are 5 non-rib-bearing lumbar vertebra. Normal alignment. No
listhesis. Multilevel anterior spurring with slight disc space
narrowing at the thoracolumbar junction. Lumbar disc spaces are
preserved. Moderate lower lumbar facet hypertrophy. No fracture,
focal bone lesion or evidence of bony destruction. No pars defects.
The sacroiliac joints are congruent.
IMPRESSION: 1. Moderate lower lumbar facet hypertrophy.
2. Multilevel spondylosis with endplate spurring.

## 2023-09-06 ENCOUNTER — Ambulatory Visit: Payer: Medicare PPO

## 2023-09-06 VITALS — Ht 70.0 in | Wt 189.0 lb

## 2023-09-06 DIAGNOSIS — Z0000 Encounter for general adult medical examination without abnormal findings: Secondary | ICD-10-CM | POA: Diagnosis not present

## 2023-09-06 NOTE — Progress Notes (Signed)
 Subjective:   Christian Kim is a 78 y.o. who presents for a Medicare Wellness preventive visit.  Visit Complete: Virtual I connected with  Steward Drone on 09/06/23 by a audio enabled telemedicine application and verified that I am speaking with the correct person using two identifiers.  Patient Location: Home  Provider Location: Office/Clinic  I discussed the limitations of evaluation and management by telemedicine. The patient expressed understanding and agreed to proceed.  Vital Signs: Because this visit was a virtual/telehealth visit, some criteria may be missing or patient reported. Any vitals not documented were not able to be obtained and vitals that have been documented are patient reported.  VideoDeclined- This patient declined Librarian, academic. Therefore the visit was completed with audio only.  Persons Participating in Visit: Patient.  AWV Questionnaire: No: Patient Medicare AWV questionnaire was not completed prior to this visit.  Cardiac Risk Factors include: advanced age (>68men, >52 women);male gender;dyslipidemia;hypertension     Objective:    Today's Vitals   09/06/23 0934  Weight: 189 lb (85.7 kg)  Height: 5\' 10"  (1.778 m)   Body mass index is 27.12 kg/m.     09/06/2023    9:44 AM 07/30/2022    8:08 AM 02/19/2022    7:00 AM 07/08/2018   10:00 AM 06/28/2017   12:08 PM 06/19/2016   10:45 AM  Advanced Directives  Does Patient Have a Medical Advance Directive? Yes Yes Yes Yes Yes No  Type of Estate agent of [place];Living will Living will Healthcare Power of [place];Living will Healthcare Power of [place];Living will Healthcare Power of [place];Living will   Does patient want to make changes to medical advance directive?  No - Patient declined No - Patient declined     Copy of Healthcare Power of Attorney in Chart? No - copy requested  No - copy requested No - copy requested Yes     Current  Medications (verified) Outpatient Encounter Medications as of 09/06/2023  Medication Sig   acetaminophen (TYLENOL) 500 MG tablet Take 1 tablet (500 mg total) by mouth every 6 (six) hours as needed.   amLODipine (NORVASC) 10 MG tablet Take 1 tablet (10 mg total) by mouth daily.   aspirin 81 MG tablet Take 81 mg by mouth daily.   Cholecalciferol (VITAMIN D3) 25 MCG (1000 UT) CAPS Take 1 capsule (1,000 Units total) by mouth daily.   clopidogrel (PLAVIX) 75 MG tablet Take 1 tablet (75 mg total) by mouth daily.   cyanocobalamin (VITAMIN B12) 500 MCG tablet Take 1 tablet (500 mcg total) by mouth daily.   Iron, Ferrous Sulfate, 325 (65 Fe) MG TABS Take 325 mg by mouth every Monday, Wednesday, and Friday.   isosorbide mononitrate (IMDUR) 30 MG 24 hr tablet Take 0.5 tablets (15 mg total) by mouth daily.   lisinopril (ZESTRIL) 40 MG tablet Take 1 tablet (40 mg total) by mouth daily.   metoprolol succinate (TOPROL-XL) 25 MG 24 hr tablet Take 1 tablet (25 mg total) by mouth daily.   MITIGARE 0.6 MG CAPS Take 1 capsule by mouth daily as needed (gout flare). Take 2 capsules on the first day of flare   nitroGLYCERIN (NITROSTAT) 0.4 MG SL tablet Place 1 tablet (0.4 mg total) under the tongue every 5 (five) minutes as needed for chest pain.   pantoprazole (PROTONIX) 40 MG tablet Take 1 tablet (40 mg total) by mouth at bedtime.   probenecid (BENEMID) 500 MG tablet TAKE ONE HALF (1/2) TABLET BY  MOUTH TWICE A DAY AS NEEDED   rosuvastatin (CRESTOR) 20 MG tablet Take 1 tablet (20 mg total) by mouth daily.   triamcinolone cream (KENALOG) 0.1 % Apply 1 application  topically daily as needed (irritation).   ursodiol (ACTIGALL) 300 MG capsule TAKE TWO CAPSULES BY MOUTH TWO TIMES DAILY   No facility-administered encounter medications on file as of 09/06/2023.    Allergies (verified) Allopurinol   History: Past Medical History:  Diagnosis Date   CAD (coronary artery disease) 2008   stent   CAP (community acquired  pneumonia) 03/07/2015   Choledocholithiasis    recurrent/multiple ERCPs with stone extraction and sphincterotomy    CHOLEDOCHOLITHIASIS 09/20/2007   Qualifier: Diagnosis of  By: Clydia Llano, [place]     GERD (gastroesophageal reflux disease)    Gout 2013   dx by crystal analysis   Hyperlipidemia    Hypertension    Myocardial infarction [facility]) 12-2006   Prostatitis 01/22/2017   Past Surgical History:  Procedure Laterality Date   CHOLECYSTECTOMY  2006   w/ revision 2/2 persistent gallstones   COLONOSCOPY  06/2013   1 polyp, rpt 5 yrs Marina Goodell)   COLONOSCOPY  11/2019   1 TA, int hem, no f/u needed Marina Goodell).    CORONARY ANGIOPLASTY WITH STENT PLACEMENT  2008   stent, Dr. Tedra Senegal   CORONARY PRESSURE/FFR STUDY N/A 02/19/2022   Procedure: INTRAVASCULAR PRESSURE WIRE/FFR STUDY;  Surgeon: Tonny Bollman, MD;  Location: [facility] INVASIVE CV LAB;  Service: Cardiovascular;  Laterality: N/A;   CORONARY STENT INTERVENTION N/A 02/19/2022   Procedure: CORONARY STENT INTERVENTION;  Surgeon: Tonny Bollman, MD;  Location: [facility] INVASIVE CV LAB;  Service: Cardiovascular;  Laterality: N/A;   CORONARY ULTRASOUND/IVUS N/A 02/19/2022   Procedure: Intravascular Ultrasound/IVUS;  Surgeon: Tonny Bollman, MD;  Location: [facility] INVASIVE CV LAB;  Service: Cardiovascular;  Laterality: N/A;   LEFT HEART CATH AND CORONARY ANGIOGRAPHY N/A 02/19/2022   Procedure: LEFT HEART CATH AND CORONARY ANGIOGRAPHY;  Surgeon: Tonny Bollman, MD;  Location: [facility] INVASIVE CV LAB;  Service: Cardiovascular;  Laterality: N/A;   NASAL SEPTUM SURGERY     Family History  Problem Relation Age of Onset   Liver cancer Mother        unknown primary origin   Lung cancer Mother        smoker   Colon cancer Neg Hx    Esophageal cancer Neg Hx    Stomach cancer Neg Hx    Rectal cancer Neg Hx    Social History   Socioeconomic History   Marital status: Married    Spouse name: Not on file   Number of children: 2   Years of education: Not on file    Highest education level: Not on file  Occupational History   Occupation: retired TEFL teacher   Occupation: Actor: Retired  Tobacco Use   Smoking status: Never   Smokeless tobacco: Never  Vaping Use   Vaping status: Never Used  Substance and Sexual Activity   Alcohol use: Yes    Comment: rarely drinks - 1 to 2 beers per month   Drug use: No   Sexual activity: Yes  Other Topics Concern   Not on file  Social History Narrative   Lives with wife, has dogs and cows   Grown children nearby   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   Activity: works farm to stay active, rabbit hunting   Diet: good water,  fruits/vegetables daily   Social Drivers of Health   Financial Resource Strain: Low Risk  (09/06/2023)   Overall Financial Resource Strain (CARDIA)    Difficulty of Paying Living Expenses: Not hard at all  Food Insecurity: No Food Insecurity (09/06/2023)   Hunger Vital Sign    Worried About Running Out of Food in the Last Year: Never true    Ran Out of Food in the Last Year: Never true  Transportation Needs: No Transportation Needs (09/06/2023)   PRAPARE - Administrator, Civil Service (Medical): No    Lack of Transportation (Non-Medical): No  Physical Activity: Sufficiently Active (09/06/2023)   Exercise Vital Sign    Days of Exercise per Week: 5 days    Minutes of Exercise per Session: 30 min  Stress: No Stress Concern Present (09/06/2023)   [employer] of Occupational Health - Occupational Stress Questionnaire    Feeling of Stress : Not at all  Social Connections: Moderately Integrated (09/06/2023)   Social Connection and Isolation Panel [NHANES]    Frequency of Communication with Friends and Family: More than three times a week    Frequency of Social Gatherings with Friends and Family: More than three times a week    Attends Religious Services: More than 4 times per year    Active Member  of [employer] or Organizations: No    Attends Engineer, structural: Never    Marital Status: Married    Tobacco Counseling Counseling given: Not Answered    Clinical Intake:  Pre-visit preparation completed: Yes  Pain : No/denies pain    BMI - recorded: 27.12 Nutritional Status: BMI 25 -29 Overweight Nutritional Risks: None Diabetes: No  Lab Results  Component Value Date   HGBA1C  01/07/2007    5.3 (NOTE)   The ADA recommends the following therapeutic goals for glycemic   control related to Hgb A1C measurement:   Goal of Therapy:   < 7.0% Hgb A1C   Action Suggested:  > 8.0% Hgb A1C   Ref:  Diabetes Care, 22, Suppl. 1, 1999     How often do you need to have someone help you when you read instructions, pamphlets, or other written materials from your doctor or pharmacy?: 1 - Never  Interpreter Needed?: No  Comments: lives with wife Information entered by :: B.Estha Few,LPN   Activities of Daily Living     09/06/2023    9:46 AM  In your present state of health, do you have any difficulty performing the following activities:  Hearing? 0  Vision? 0  Difficulty concentrating or making decisions? 0  Walking or climbing stairs? 0  Dressing or bathing? 0  Doing errands, shopping? 0  Preparing Food and eating ? N  Using the Toilet? N  In the past six months, have you accidently leaked urine? N  Do you have problems with loss of bowel control? N  Managing your Medications? N  Managing your Finances? N  Housekeeping or managing your Housekeeping? N    Patient Care Team: Eustaquio Boyden, MD as PCP - General (Family Medicine) Tonny Bollman, MD as PCP - Cardiology (Cardiology) Domingo Madeira, OD (Optometry)  Indicate any recent Medical Services you may have received from other than [facility] providers in the past year (date may be approximate).    Assessment:   This is a routine wellness examination for Rayce.  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       Maintain healthy lifestyle (pt-stated)   On track     09/06/23 Healthy diet Stay hydrated Stay active       Depression Screen     09/06/2023    9:40 AM 07/30/2022    8:50 AM 07/30/2022    8:10 AM 07/28/2021   10:41 AM 07/22/2020   11:45 AM 07/21/2019   11:39 AM 07/08/2018    9:52 AM  PHQ 2/9 Scores  PHQ - 2 Score 0 1 0 0 0 0 0  PHQ- 9 Score  1     0    Fall Risk     09/06/2023    9:36 AM 07/30/2022    8:50 AM 07/30/2022    8:10 AM 07/28/2021   10:41 AM 07/22/2020   11:44 AM  Fall Risk   Falls in the past year? 0 0 0 0   Number falls in past yr: 0  0  0  Comment     Tipped over dog  Injury with Fall? 0  0  0  Risk for fall due to : No Fall Risks      Follow up Education provided;Falls prevention discussed  Falls evaluation completed;Falls prevention discussed      MEDICARE RISK AT HOME:  Medicare Risk at Home Any stairs in or around the home?: Yes If so, are there any without handrails?: Yes Home free of loose throw rugs in walkways, pet beds, electrical cords, etc?: Yes Adequate lighting in your home to reduce risk of falls?: Yes Life alert?: No Use of a cane, walker or w/c?: No Grab bars in the bathroom?: No Shower chair or bench in shower?: Yes (does not use) Elevated toilet seat or a handicapped toilet?: No  TIMED UP AND GO:  Was the test performed?  No  Cognitive Function: 6CIT completed    07/08/2018    9:51 AM 06/28/2017   12:43 PM 06/19/2016   10:33 AM  MMSE - Mini Mental State Exam  Orientation to time 5 5 5   Orientation to Place 5 5 5   Registration 3 3 3   Attention/ Calculation 0 0 0  Recall 3 3 3   Language- name 2  objects 0 0 0  Language- repeat 1 1 1   Language- follow 3 step command 3 3 3   Language- read & follow direction 0 0 0  Write a sentence 0 0 0  Copy design 0 0 0  Total score 20 20 20         09/06/2023    9:47 AM 07/30/2022    8:13 AM  6CIT Screen  What Year? 0 points 0 points  What month? 0 points 0 points  What time? 0 points 0 points  Count back from 20 0 points 0 points  Months in reverse 0 points 0 points  Repeat phrase 0 points 0 points  Total Score 0 points 0 points    Immunizations Immunization History  Administered Date(s) Administered   Fluad Quad(high Dose 65+) 02/07/2022   Influenza Whole 03/09/2009, 02/26/2011, 02/26/2012   Influenza, High Dose Seasonal PF 02/03/2014, 04/15/2015,  03/24/2019, 03/15/2020, 03/14/2021   Influenza,inj,Quad PF,6+ Mos 03/04/2013, 03/08/2018   Influenza-Unspecified 02/26/2016, 02/25/2017   PFIZER(Purple Top)SARS-COV-2 Vaccination 07/14/2019, 08/04/2019, 05/18/2020   Pneumococcal Conjugate-13 06/07/2014   Pneumococcal Polysaccharide-23 01/09/2011   Td 01/29/2002   Tdap 06/14/2015   Zoster, Live 01/09/2011    Screening Tests Health Maintenance  Topic Date Due   Zoster Vaccines- Shingrix (1 of 2) 05/30/1995   COVID-19 Vaccine (4 - 2024-25 season) 01/27/2023   INFLUENZA VACCINE  12/27/2023   Medicare Annual Wellness (AWV)  09/05/2024   Colonoscopy  12/21/2024   DTaP/Tdap/Td (3 - Td or Tdap) 06/13/2025   Pneumonia Vaccine 54+ Years old  Completed   Hepatitis C Screening  Completed   HPV VACCINES  Aged Out   Meningococcal B Vaccine  Aged Out    Health Maintenance  Health Maintenance Due  Topic Date Due   Zoster Vaccines- Shingrix (1 of 2) 05/30/1995   COVID-19 Vaccine (4 - 2024-25 season) 01/27/2023   Health Maintenance Items Addressed: Pt will make an appt with Dr Larence Penning for annual exam  Vaccinations: pt declines at this time: Shingles vaccine: recommend Shingrix which is 2 doses 2-6 months apart and over 90% effective      Covid-19: recommend 2 doses one month apart with a booster 6 months later   Additional Screening:  Vision Screening: Recommended annual ophthalmology exams for early detection of glaucoma and other disorders of the eye.  Dental Screening: Recommended annual dental exams for proper oral hygiene  Community Resource Referral / Chronic Care Management: CRR required this visit?  No   CCM required this visit?  No    Plan:     I have personally reviewed and noted the following in the patient's chart:   Medical and social history Use of alcohol, tobacco or illicit drugs  Current medications and supplements including opioid prescriptions. Patient is not currently taking opioid prescriptions. Functional ability and status Nutritional status Physical activity Advanced directives List of other physicians Hospitalizations, surgeries, and ER visits in previous 12 months Vitals Screenings to include cognitive, depression, and falls Referrals and appointments  In addition, I have reviewed and discussed with patient certain preventive protocols, quality metrics, and best practice recommendations. A written personalized care plan for preventive services as well as general preventive health recommendations were provided to patient.   Sue Lush, LPN   10/23/4130   After Visit Summary: (MyChart) Due to this being a telephonic visit, the after visit summary with patients personalized plan was offered to patient via MyChart   Notes: Nothing significant to report at this time.

## 2023-09-06 NOTE — Patient Instructions (Signed)
 [name] , Thank you for taking time to come for your Medicare Wellness Visit. I appreciate your ongoing commitment to your health goals. Please review the following plan we discussed and let me know if I can assist you in the future.   Referrals/Orders/Follow-Ups/Clinician Recommendations:   Pt will call for Eye exam with Dr Larence Penning  This is a list of the screening recommended for you and due dates:  Health Maintenance  Topic Date Due   Zoster (Shingles) Vaccine (1 of 2) 05/30/1995   COVID-19 Vaccine (4 - 2024-25 season) 01/27/2023   Flu Shot  12/27/2023   Medicare Annual Wellness Visit  09/05/2024   Colon Cancer Screening  12/21/2024   DTaP/Tdap/Td vaccine (3 - Td or Tdap) 06/13/2025   Pneumonia Vaccine  Completed   Hepatitis C Screening  Completed   HPV Vaccine  Aged Out   Meningitis B Vaccine  Aged Out    Advanced directives: (Copy Requested) Please bring a copy of your health care power of attorney and living will to the office to be added to your chart at your convenience. You can mail to [facility] [address] [place], [place] [address] or email to [email or web address]  Next Medicare Annual Wellness Visit scheduled for next year: Yes 09/09/23 @ 9:30am televisit

## 2023-09-14 ENCOUNTER — Other Ambulatory Visit: Payer: Self-pay | Admitting: Family Medicine

## 2023-09-14 DIAGNOSIS — N1832 Chronic kidney disease, stage 3b: Secondary | ICD-10-CM

## 2023-09-14 DIAGNOSIS — E559 Vitamin D deficiency, unspecified: Secondary | ICD-10-CM

## 2023-09-14 DIAGNOSIS — D509 Iron deficiency anemia, unspecified: Secondary | ICD-10-CM

## 2023-09-14 DIAGNOSIS — M1A9XX0 Chronic gout, unspecified, without tophus (tophi): Secondary | ICD-10-CM

## 2023-09-14 DIAGNOSIS — E782 Mixed hyperlipidemia: Secondary | ICD-10-CM

## 2023-09-16 ENCOUNTER — Other Ambulatory Visit (INDEPENDENT_AMBULATORY_CARE_PROVIDER_SITE_OTHER)

## 2023-09-16 DIAGNOSIS — E782 Mixed hyperlipidemia: Secondary | ICD-10-CM | POA: Diagnosis not present

## 2023-09-16 DIAGNOSIS — D509 Iron deficiency anemia, unspecified: Secondary | ICD-10-CM | POA: Diagnosis not present

## 2023-09-16 DIAGNOSIS — M1A9XX0 Chronic gout, unspecified, without tophus (tophi): Secondary | ICD-10-CM

## 2023-09-16 DIAGNOSIS — N1832 Chronic kidney disease, stage 3b: Secondary | ICD-10-CM

## 2023-09-16 DIAGNOSIS — E559 Vitamin D deficiency, unspecified: Secondary | ICD-10-CM | POA: Diagnosis not present

## 2023-09-16 LAB — MICROALBUMIN / CREATININE URINE RATIO
Creatinine,U: 107.3 mg/dL
Microalb Creat Ratio: UNDETERMINED mg/g (ref 0.0–30.0)
Microalb, Ur: <0.7 mg/dL

## 2023-09-16 LAB — CBC WITH DIFFERENTIAL/PLATELET
Basophils Absolute: 0 10*3/uL (ref 0.0–0.1)
Basophils Relative: 0.6 % (ref 0.0–3.0)
Eosinophils Absolute: 0.3 10*3/uL (ref 0.0–0.7)
Eosinophils Relative: 5.2 % — ABNORMAL HIGH (ref 0.0–5.0)
HCT: 44.3 % (ref 39.0–52.0)
Hemoglobin: 15.3 g/dL (ref 13.0–17.0)
Lymphocytes Relative: 25.6 % (ref 12.0–46.0)
Lymphs Abs: 1.4 10*3/uL (ref 0.7–4.0)
MCHC: 34.6 g/dL (ref 30.0–36.0)
MCV: 94.5 fl (ref 78.0–100.0)
Monocytes Absolute: 0.5 10*3/uL (ref 0.1–1.0)
Monocytes Relative: 10.2 % (ref 3.0–12.0)
Neutro Abs: 3.1 10*3/uL (ref 1.4–7.7)
Neutrophils Relative %: 58.4 % (ref 43.0–77.0)
Platelets: 166 10*3/uL (ref 150.0–400.0)
RBC: 4.68 Mil/uL (ref 4.22–5.81)
RDW: 13 % (ref 11.5–15.5)
WBC: 5.4 10*3/uL (ref 4.0–10.5)

## 2023-09-16 LAB — COMPREHENSIVE METABOLIC PANEL WITH GFR
ALT: 13 U/L (ref 0–53)
AST: 13 U/L (ref 0–37)
Albumin: 4.5 g/dL (ref 3.5–5.2)
Alkaline Phosphatase: 84 U/L (ref 39–117)
BUN: 20 mg/dL (ref 6–23)
CO2: 29 meq/L (ref 19–32)
Calcium: 9.3 mg/dL (ref 8.4–10.5)
Chloride: 103 meq/L (ref 96–112)
Creatinine, Ser: 1.57 mg/dL — ABNORMAL HIGH (ref 0.40–1.50)
GFR: 42.02 mL/min — ABNORMAL LOW (ref >60.00)
Glucose, Bld: 93 mg/dL (ref 70–99)
Potassium: 4.1 meq/L (ref 3.5–5.1)
Sodium: 139 meq/L (ref 135–145)
Total Bilirubin: 1 mg/dL (ref 0.2–1.2)
Total Protein: 7 g/dL (ref 6.0–8.3)

## 2023-09-16 LAB — URIC ACID: Uric Acid, Serum: 6.7 mg/dL (ref 4.0–7.8)

## 2023-09-16 LAB — FERRITIN: Ferritin: 38.9 ng/mL (ref 22.0–322.0)

## 2023-09-16 LAB — LIPID PANEL
Cholesterol: 113 mg/dL (ref 0–200)
HDL: 32.2 mg/dL — ABNORMAL LOW (ref >39.00)
LDL Cholesterol: 37 mg/dL (ref 0–99)
NonHDL: 81.24
Total CHOL/HDL Ratio: 4
Triglycerides: 223 mg/dL — ABNORMAL HIGH (ref 0.0–149.0)
VLDL: 44.6 mg/dL — ABNORMAL HIGH (ref 0.0–40.0)

## 2023-09-16 LAB — PHOSPHORUS: Phosphorus: 3.3 mg/dL (ref 2.3–4.6)

## 2023-09-16 LAB — VITAMIN D 25 HYDROXY (VIT D DEFICIENCY, FRACTURES): VITD: 40.43 ng/mL (ref 30.00–100.00)

## 2023-09-16 LAB — IBC PANEL
Iron: 114 ug/dL (ref 42–165)
Saturation Ratios: 32.4 % (ref 20.0–50.0)
TIBC: 351.4 ug/dL (ref 250.0–450.0)
Transferrin: 251 mg/dL (ref 212.0–360.0)

## 2023-09-16 LAB — TSH: TSH: 3.67 u[IU]/mL (ref 0.35–5.50)

## 2023-09-17 LAB — PARATHYROID HORMONE, INTACT (NO CA): PTH: 30 pg/mL (ref 16–77)

## 2023-09-23 ENCOUNTER — Ambulatory Visit (INDEPENDENT_AMBULATORY_CARE_PROVIDER_SITE_OTHER): Admitting: Family Medicine

## 2023-09-23 ENCOUNTER — Encounter: Payer: Self-pay | Admitting: Family Medicine

## 2023-09-23 VITALS — BP 126/68 | HR 55 | Temp 98.8°F | Ht 68.5 in | Wt 191.1 lb

## 2023-09-23 DIAGNOSIS — I2510 Atherosclerotic heart disease of native coronary artery without angina pectoris: Secondary | ICD-10-CM

## 2023-09-23 DIAGNOSIS — E782 Mixed hyperlipidemia: Secondary | ICD-10-CM | POA: Diagnosis not present

## 2023-09-23 DIAGNOSIS — K831 Obstruction of bile duct: Secondary | ICD-10-CM | POA: Diagnosis not present

## 2023-09-23 DIAGNOSIS — I10 Essential (primary) hypertension: Secondary | ICD-10-CM | POA: Diagnosis not present

## 2023-09-23 DIAGNOSIS — K219 Gastro-esophageal reflux disease without esophagitis: Secondary | ICD-10-CM | POA: Diagnosis not present

## 2023-09-23 DIAGNOSIS — Z7189 Other specified counseling: Secondary | ICD-10-CM

## 2023-09-23 DIAGNOSIS — E559 Vitamin D deficiency, unspecified: Secondary | ICD-10-CM

## 2023-09-23 DIAGNOSIS — M1A9XX0 Chronic gout, unspecified, without tophus (tophi): Secondary | ICD-10-CM | POA: Diagnosis not present

## 2023-09-23 DIAGNOSIS — Z0000 Encounter for general adult medical examination without abnormal findings: Secondary | ICD-10-CM

## 2023-09-23 DIAGNOSIS — N1832 Chronic kidney disease, stage 3b: Secondary | ICD-10-CM | POA: Diagnosis not present

## 2023-09-23 DIAGNOSIS — D509 Iron deficiency anemia, unspecified: Secondary | ICD-10-CM

## 2023-09-23 MED ORDER — LISINOPRIL 40 MG PO TABS: 40.0000 mg | ORAL_TABLET | Freq: Every day | ORAL | 3 refills | Status: AC

## 2023-09-23 MED ORDER — PROBENECID 500 MG PO TABS: ORAL_TABLET | ORAL | 3 refills | Status: AC

## 2023-09-23 NOTE — Assessment & Plan Note (Signed)
 H/o recurrent choledocholithiasis despite cholecystectomy.  Continues daily ursodiol  preventatively.

## 2023-09-23 NOTE — Assessment & Plan Note (Addendum)
 Chronic, reviewed trend in slowly deteriorating control over the past 10 yrs.  Rec try to decrease PPI use.  Continue tight BP control, continue lisinopril  40mg  daily.  Not microalbuminuric.

## 2023-09-23 NOTE — Assessment & Plan Note (Signed)
 Chronic, managed with pantoprazole  40mg  daily.  Recommend try taper ie QOD dosing. Discussed caution in CKD.

## 2023-09-23 NOTE — Assessment & Plan Note (Signed)
Advanced directives:  Would want wife to be HCPOA. Has living will at home. [place] with life support if possibility of reversal but doesn't want prolonged life support if terminal condition. Asked to bring us a copy.  

## 2023-09-23 NOTE — Assessment & Plan Note (Signed)
 Continue vit D daily replacement

## 2023-09-23 NOTE — Assessment & Plan Note (Signed)
 Preventative protocols reviewed and updated unless pt declined. Discussed healthy diet and lifestyle.

## 2023-09-23 NOTE — Assessment & Plan Note (Signed)
 Chronic, reviewed deteriorated triglyceride control and dietary measures to combat this.  Continue ursodiol  with crestor  20mg  daily The ASCVD Risk score (Arnett DK, et al., 2019) failed to calculate for the following reasons:   The valid total cholesterol range is 130 to 320 mg/dL

## 2023-09-23 NOTE — Assessment & Plan Note (Addendum)
 Chronic, stable period without recurrent gout flare although urate levels are trending up Continue probenecid  250mg  bid with PRN colchicine .

## 2023-09-23 NOTE — Assessment & Plan Note (Signed)
 Chronic, stable continue current regimen.

## 2023-09-23 NOTE — Progress Notes (Signed)
 Ph: [phone] Fax: [phone]   Patient ID: Christian Kim, male    DOB: 30-Mar-1946, 78 y.o.   MRN: [id number]  This visit was conducted in person.  BP 126/68   Pulse (!) 55   Temp 98.8 F (37.1 C) (Oral)   Ht 5' 8.5" (1.74 m)   Wt 191 lb 2 oz (86.7 kg)   SpO2 97%   BMI 28.64 kg/m    CC: CPE Subjective:   HPI: Christian Kim is a 78 y.o. male presenting on 09/23/2023 for Annual Exam (CPE prt 2 [AWV- 09/06/23].)   Saw health advisor 09/06/2023 for medicare wellness visit. Note reviewed.   No results found.  Flowsheet Row Office Visit from 09/23/2023 in [facility] [place]  PHQ-2 Total Score 0          09/23/2023   12:34 PM 09/06/2023    9:36 AM 07/30/2022    8:50 AM 07/30/2022    8:10 AM 07/28/2021   10:41 AM  Fall Risk   Falls in the past year? 0 0 0 0 0  Number falls in past yr:  0  0   Injury with Fall?  0  0   Risk for fall due to :  No Fall Risks     Follow up  Education provided;Falls prevention discussed  Falls evaluation completed;Falls prevention discussed     Chronic ursodiol  use 2/2 recurrent choledocholithiasis after cholecystectomy followed by LBGI Q2 yrs. Episode of gallstone pain several weeks ago after eating pimento cheese sandwich.  He continues taking pantoprazole  40mg .   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   CAD - sees cardiology yearly Arlester Ladd).  Underwent cardiac catheterization 01/2022 s/p stent placement x2.   Preventative: COLONOSCOPY Date: 06/2013 1 SSP, rpt 5 yrs Elvin Hammer).  Colonoscopy 11/2019 - 1 TA, int hem, no f/u needed Elvin Hammer). no boewl changes or blood in stool.  Prostate cancer screening - no problems, aged out. Denies nocturia or LUTS sxs.  Lung cancer screening - not eligible Flu shot yearly.  COVID vaccine Pfizer 06/2019, 07/2019, booster 04/2020 Pneumovax 2012, prevnar-13 05/2014 Tdap 05/2015  Zostavax 2012 - rash after he got shot.  Shingrix - discussed, to consider Advanced  directives:  Would want wife to be HCPOA. Has living will at home. Ok with life support if possibility of reversal but doesn't want prolonged life support if terminal condition. Asked to bring us  a copy.  Seat belt use discussed.  Sunscreen use discussed. No changing moles on skin.  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 Activity: works farm to stay active, deer and rabbit hunting  Diet: good water, fruits/vegetables daily      Relevant past medical, surgical, family and social history reviewed and updated as indicated. Interim medical history since our last visit reviewed. Allergies and medications reviewed and updated. Outpatient Medications Prior to Visit  Medication Sig Dispense Refill   acetaminophen  (TYLENOL ) 500 MG tablet Take 1 tablet (500 mg total) by mouth every 6 (six) hours as needed.     amLODipine  (NORVASC ) 10 MG tablet Take 1 tablet (10 mg total) by mouth daily. 90 tablet 3  aspirin  81 MG tablet Take 81 mg by mouth daily.     Cholecalciferol (VITAMIN D3) 25 MCG (1000 UT) CAPS Take 1 capsule (1,000 Units total) by mouth daily. 30 capsule    clopidogrel  (PLAVIX ) 75 MG tablet Take 1 tablet (75 mg total) by mouth daily. 90 tablet 3   cyanocobalamin  (VITAMIN B12) 500 MCG tablet Take 1 tablet (500 mcg total) by mouth daily.     Iron , Ferrous Sulfate , 325 (65 Fe) MG TABS Take 325 mg by mouth every Monday, Wednesday, and Friday.     isosorbide  mononitrate (IMDUR ) 30 MG 24 hr tablet Take 0.5 tablets (15 mg total) by mouth daily. 45 tablet 3   metoprolol  succinate (TOPROL -XL) 25 MG 24 hr tablet Take 1 tablet (25 mg total) by mouth daily. 90 tablet 3   MITIGARE  0.6 MG CAPS Take 1 capsule by mouth daily as needed  (gout flare). Take 2 capsules on the first day of flare 30 capsule 3   nitroGLYCERIN  (NITROSTAT ) 0.4 MG SL tablet Place 1 tablet (0.4 mg total) under the tongue every 5 (five) minutes as needed for chest pain. 25 tablet 3   pantoprazole  (PROTONIX ) 40 MG tablet Take 1 tablet (40 mg total) by mouth at bedtime. 30 tablet 0   rosuvastatin  (CRESTOR ) 20 MG tablet Take 1 tablet (20 mg total) by mouth daily. 90 tablet 3   triamcinolone  cream (KENALOG) 0.1 % Apply 1 application  topically daily as needed (irritation).     ursodiol  (ACTIGALL ) 300 MG capsule TAKE TWO CAPSULES BY MOUTH TWO TIMES DAILY 360 capsule 3   lisinopril  (ZESTRIL ) 40 MG tablet Take 1 tablet (40 mg total) by mouth daily. 90 tablet 4   probenecid  (BENEMID ) 500 MG tablet TAKE ONE HALF (1/2) TABLET BY MOUTH TWICE A DAY AS NEEDED 90 tablet 0   No facility-administered medications prior to visit.     Per HPI unless specifically indicated in ROS section below Review of Systems  Constitutional:  Negative for activity change, appetite change, chills, fatigue, fever and unexpected weight change.  HENT:  Negative for hearing loss.   Eyes:  Negative for visual disturbance.  Respiratory:  Negative for cough, chest tightness, shortness of breath and wheezing.   Cardiovascular:  Negative for chest pain, palpitations and leg swelling.  Gastrointestinal:  Negative for abdominal distention, abdominal pain, blood in stool, constipation, diarrhea, nausea and vomiting.  Genitourinary:  Negative for difficulty urinating and hematuria.  Musculoskeletal:  Negative for arthralgias, myalgias and neck pain.  Skin:  Negative for rash.  Neurological:  Negative for dizziness, seizures, syncope and headaches.  Hematological:  Negative for adenopathy. Does not bruise/bleed easily.  Psychiatric/Behavioral:  Negative for dysphoric mood. The patient is not nervous/anxious.     Objective:  BP 126/68   Pulse (!) 55   Temp 98.8 F (37.1 C) (Oral)   Ht 5' 8.5"  (1.74 m)   Wt 191 lb 2 oz (86.7 kg)   SpO2 97%   BMI 28.64 kg/m   Wt Readings from Last 3 Encounters:  09/23/23 191 lb 2 oz (86.7 kg)  09/06/23 189 lb (85.7 kg)  05/21/23 189 lb (85.7 kg)      Physical Exam Vitals and nursing note reviewed.  Constitutional:      General: He is not in acute distress.    Appearance: Normal appearance. He is well-developed. He is not ill-appearing.  HENT:     Head: Normocephalic and atraumatic.     Right Ear: Hearing, tympanic membrane, ear  canal and external ear normal.     Left Ear: Hearing, tympanic membrane, ear canal and external ear normal.     Mouth/Throat:     Mouth: Mucous membranes are moist.     Pharynx: Oropharynx is clear. No oropharyngeal exudate or posterior oropharyngeal erythema.  Eyes:     General: No scleral icterus.    Extraocular Movements: Extraocular movements intact.     Conjunctiva/sclera: Conjunctivae normal.     Pupils: Pupils are equal, round, and reactive to light.  Neck:     Thyroid : No thyroid  mass or thyromegaly.     Vascular: No carotid bruit.  Cardiovascular:     Rate and Rhythm: Normal rate and regular rhythm.     Pulses: Normal pulses.          Radial pulses are 2+ on the right side and 2+ on the left side.     Heart sounds: Normal heart sounds. No murmur heard. Pulmonary:     Effort: Pulmonary effort is normal. No respiratory distress.     Breath sounds: Normal breath sounds. No wheezing, rhonchi or rales.  Abdominal:     General: Bowel sounds are normal. There is no distension.     Palpations: Abdomen is soft. There is no mass.     Tenderness: There is no abdominal tenderness. There is no guarding or rebound.     Hernia: No hernia is present.  Musculoskeletal:        General: Normal range of motion.     Cervical back: Normal range of motion and neck supple.     Right lower leg: No edema.     Left lower leg: No edema.  Lymphadenopathy:     Cervical: No cervical adenopathy.  Skin:    General: Skin  is warm and dry.     Findings: No rash.  Neurological:     General: No focal deficit present.     Mental Status: He is alert and oriented to person, place, and time.  Psychiatric:        Mood and Affect: Mood normal.        Behavior: Behavior normal.        Thought Content: Thought content normal.        Judgment: Judgment normal.       Results for orders placed or performed in visit on 09/16/23  TSH   Collection Time: 09/16/23  8:57 AM  Result Value Ref Range   TSH 3.67 0.35 - 5.50 uIU/mL  Ferritin   Collection Time: 09/16/23  8:57 AM  Result Value Ref Range   Ferritin 38.9 22.0 - 322.0 ng/mL  IBC panel   Collection Time: 09/16/23  8:57 AM  Result Value Ref Range   Iron  114 42 - 165 ug/dL   Transferrin 161.0 960.4 - 360.0 mg/dL   Saturation Ratios 54.0 20.0 - 50.0 %   TIBC 351.4 250.0 - 450.0 mcg/dL  VITAMIN D  25 Hydroxy (Vit-D Deficiency, Fractures)   Collection Time: 09/16/23  8:57 AM  Result Value Ref Range   VITD 40.43 30.00 - 100.00 ng/mL  Uric acid   Collection Time: 09/16/23  8:57 AM  Result Value Ref Range   Uric Acid, Serum 6.7 4.0 - 7.8 mg/dL  Comprehensive metabolic panel with GFR   Collection Time: 09/16/23  8:57 AM  Result Value Ref Range   Sodium 139 135 - 145 mEq/L   Potassium 4.1 3.5 - 5.1 mEq/L   Chloride 103 96 - 112 mEq/L  CO2 29 19 - 32 mEq/L   Glucose, Bld 93 70 - 99 mg/dL   BUN 20 6 - 23 mg/dL   Creatinine, Ser 1.61 (H) 0.40 - 1.50 mg/dL   Total Bilirubin 1.0 0.2 - 1.2 mg/dL   Alkaline Phosphatase 84 39 - 117 U/L   AST 13 0 - 37 U/L   ALT 13 0 - 53 U/L   Total Protein 7.0 6.0 - 8.3 g/dL   Albumin 4.5 3.5 - 5.2 g/dL   GFR 09.60 (L) >45.40 mL/min   Calcium  9.3 8.4 - 10.5 mg/dL  Lipid panel   Collection Time: 09/16/23  8:57 AM  Result Value Ref Range   Cholesterol 113 0 - 200 mg/dL   Triglycerides 981.1 (H) 0.0 - 149.0 mg/dL   HDL 91.47 (L) >82.95 mg/dL   VLDL 62.1 (H) 0.0 - 30.8 mg/dL   LDL Cholesterol 37 0 - 99 mg/dL   Total  CHOL/HDL Ratio 4    NonHDL 81.24   CBC with Differential/Platelet   Collection Time: 09/16/23  8:57 AM  Result Value Ref Range   WBC 5.4 4.0 - 10.5 K/uL   RBC 4.68 4.22 - 5.81 Mil/uL   Hemoglobin 15.3 13.0 - 17.0 g/dL   HCT 65.7 84.6 - 96.2 %   MCV 94.5 78.0 - 100.0 fl   MCHC 34.6 30.0 - 36.0 g/dL   RDW 95.2 84.1 - 32.4 %   Platelets 166.0 150.0 - 400.0 K/uL   Neutrophils Relative % 58.4 43.0 - 77.0 %   Lymphocytes Relative 25.6 12.0 - 46.0 %   Monocytes Relative 10.2 3.0 - 12.0 %   Eosinophils Relative 5.2 (H) 0.0 - 5.0 %   Basophils Relative 0.6 0.0 - 3.0 %   Neutro Abs 3.1 1.4 - 7.7 K/uL   Lymphs Abs 1.4 0.7 - 4.0 K/uL   Monocytes Absolute 0.5 0.1 - 1.0 K/uL   Eosinophils Absolute 0.3 0.0 - 0.7 K/uL   Basophils Absolute 0.0 0.0 - 0.1 K/uL  Parathyroid  hormone, intact (no Ca)   Collection Time: 09/16/23  8:57 AM  Result Value Ref Range   PTH 30 16 - 77 pg/mL  Microalbumin / creatinine urine ratio   Collection Time: 09/16/23  8:57 AM  Result Value Ref Range   Microalb, Ur <0.7 mg/dL   Creatinine,U 401.0 mg/dL   Microalb Creat Ratio Unable to calculate 0.0 - 30.0 mg/g  Phosphorus   Collection Time: 09/16/23  8:57 AM  Result Value Ref Range   Phosphorus 3.3 2.3 - 4.6 mg/dL    Assessment & Plan:   Problem List Items Addressed This Visit     HLD (hyperlipidemia) (Chronic)   Chronic, reviewed deteriorated triglyceride control and dietary measures to combat this.  Continue ursodiol  with crestor  20mg  daily The ASCVD Risk score (Arnett DK, et al., 2019) failed to calculate for the following reasons:   The valid total cholesterol range is 130 to 320 mg/dL       Relevant Medications   lisinopril  (ZESTRIL ) 40 MG tablet   Essential hypertension (Chronic)   Chronic, stable continue current regimen.       Relevant Medications   lisinopril  (ZESTRIL ) 40 MG tablet   Coronary artery disease involving native coronary artery of native heart without angina pectoris (Chronic)    Appreciate cardiology care.       Relevant Medications   lisinopril  (ZESTRIL ) 40 MG tablet   Chronic kidney disease, stage 3b (HCC) (Chronic)   Chronic, reviewed trend in slowly deteriorating  control over the past 10 yrs.  Rec try to decrease PPI use.  Continue tight BP control, continue lisinopril  40mg  daily.  Not microalbuminuric.       Health maintenance examination - Primary (Chronic)   Preventative protocols reviewed and updated unless pt declined. Discussed healthy diet and lifestyle.       Advanced directives, counseling/discussion (Chronic)   Advanced directives:  Would want wife to be HCPOA. Has living will at home. Ok with life support if possibility of reversal but doesn't want prolonged life support if terminal condition. Asked to bring us  a copy.       Common bile duct (CBD) obstruction   H/o recurrent choledocholithiasis despite cholecystectomy.  Continues daily ursodiol  preventatively.       Gout   Chronic, stable period without recurrent gout flare although urate levels are trending up Continue probenecid  250mg  bid with PRN colchicine .       Relevant Medications   probenecid  (BENEMID ) 500 MG tablet   Vitamin D  deficiency   Continue vit D daily replacement      Gastroesophageal reflux disease without esophagitis   Chronic, managed with pantoprazole  40mg  daily.  Recommend try taper ie QOD dosing. Discussed caution in CKD.       Iron  deficiency anemia   Iron  deficiency anemia has resolved with low dose oral iron  replacement. Colonoscopy 2021 was reassuring.         Meds ordered this encounter  Medications   lisinopril  (ZESTRIL ) 40 MG tablet    Sig: Take 1 tablet (40 mg total) by mouth daily.    Dispense:  90 tablet    Refill:  3   probenecid  (BENEMID ) 500 MG tablet    Sig: TAKE ONE HALF (1/2) TABLET BY MOUTH TWICE A DAY AS NEEDED    Dispense:  90 tablet    Refill:  3    No orders of the defined types were placed in this  encounter.   Patient Instructions  If interested, check with pharmacy about 2 shot shingles series (shingrix).  Bring us  copy of your advanced directive/living will You are doing well today Return as needed or in 1 year for next physical.   Follow up plan: Return in about 1 year (around 09/22/2024) for annual exam, prior fasting for blood work, medicare wellness visit.  Claire Crick, MD

## 2023-09-23 NOTE — Assessment & Plan Note (Addendum)
 Iron  deficiency anemia has resolved with low dose oral iron  replacement. Colonoscopy 2021 was reassuring.

## 2023-09-23 NOTE — Assessment & Plan Note (Signed)
Appreciate cardiology care.  °

## 2023-09-23 NOTE — Patient Instructions (Addendum)
 If interested, check with pharmacy about 2 shot shingles series (shingrix).  Bring us  copy of your advanced directive/living will You are doing well today Return as needed or in 1 year for next physical.

## 2023-10-01 ENCOUNTER — Other Ambulatory Visit: Payer: Self-pay | Admitting: Cardiovascular Disease

## 2023-12-18 DIAGNOSIS — D225 Melanocytic nevi of trunk: Secondary | ICD-10-CM | POA: Diagnosis not present

## 2023-12-18 DIAGNOSIS — Z1283 Encounter for screening for malignant neoplasm of skin: Secondary | ICD-10-CM | POA: Diagnosis not present

## 2023-12-18 DIAGNOSIS — L570 Actinic keratosis: Secondary | ICD-10-CM | POA: Diagnosis not present

## 2023-12-18 DIAGNOSIS — X32XXXD Exposure to sunlight, subsequent encounter: Secondary | ICD-10-CM | POA: Diagnosis not present

## 2024-03-23 ENCOUNTER — Other Ambulatory Visit: Payer: Self-pay | Admitting: Physician Assistant

## 2024-05-26 ENCOUNTER — Other Ambulatory Visit: Payer: Self-pay | Admitting: Physician Assistant

## 2024-06-05 ENCOUNTER — Other Ambulatory Visit: Payer: Self-pay | Admitting: Physician Assistant

## 2024-06-05 ENCOUNTER — Other Ambulatory Visit: Payer: Self-pay | Admitting: Cardiovascular Disease

## 2024-06-10 ENCOUNTER — Telehealth: Payer: Self-pay

## 2024-06-10 NOTE — Telephone Encounter (Addendum)
 He is already scheduled for CPE labs 1 wk prior to next CPE in April. Will order closer to the date as is the protocol in our office.  This will always be my answer to these requests

## 2024-06-10 NOTE — Telephone Encounter (Signed)
 Copied from CRM #[id number]. Topic: Clinical - Request for Lab/Test Order >> Jun 09, 2024 10:13 AM Suzen RAMAN wrote: Reason for CRM: Patient would like to have labs orders placed/completed prior to upcoming physical scheduled for 09/23/24  please contact patient to schedule once orders are placed.   CB# [phone]

## 2024-07-02 ENCOUNTER — Other Ambulatory Visit: Payer: Self-pay | Admitting: Cardiovascular Disease

## 2024-09-08 ENCOUNTER — Ambulatory Visit

## 2024-09-15 ENCOUNTER — Other Ambulatory Visit

## 2024-09-23 ENCOUNTER — Encounter: Admitting: Family Medicine
